# Patient Record
Sex: Female | Born: 1955
Health system: Southern US, Community
[De-identification: ages and names within clinical notes are randomized; demographics above are authoritative.]

## PROBLEM LIST (undated history)

## (undated) DIAGNOSIS — I499 Cardiac arrhythmia, unspecified: Secondary | ICD-10-CM

## (undated) DIAGNOSIS — R51 Headache: Secondary | ICD-10-CM

## (undated) DIAGNOSIS — E079 Disorder of thyroid, unspecified: Secondary | ICD-10-CM

## (undated) DIAGNOSIS — F329 Major depressive disorder, single episode, unspecified: Secondary | ICD-10-CM

## (undated) DIAGNOSIS — J189 Pneumonia, unspecified organism: Secondary | ICD-10-CM

## (undated) DIAGNOSIS — K5792 Diverticulitis of intestine, part unspecified, without perforation or abscess without bleeding: Secondary | ICD-10-CM

## (undated) DIAGNOSIS — K219 Gastro-esophageal reflux disease without esophagitis: Secondary | ICD-10-CM

## (undated) DIAGNOSIS — E039 Hypothyroidism, unspecified: Secondary | ICD-10-CM

## (undated) DIAGNOSIS — G4730 Sleep apnea, unspecified: Secondary | ICD-10-CM

## (undated) DIAGNOSIS — F32A Depression, unspecified: Secondary | ICD-10-CM

## (undated) DIAGNOSIS — M5136 Other intervertebral disc degeneration, lumbar region: Secondary | ICD-10-CM

## (undated) DIAGNOSIS — Q7649 Other congenital malformations of spine, not associated with scoliosis: Secondary | ICD-10-CM

## (undated) DIAGNOSIS — M51369 Other intervertebral disc degeneration, lumbar region without mention of lumbar back pain or lower extremity pain: Secondary | ICD-10-CM

## (undated) DIAGNOSIS — F419 Anxiety disorder, unspecified: Secondary | ICD-10-CM

## (undated) DIAGNOSIS — Z972 Presence of dental prosthetic device (complete) (partial): Secondary | ICD-10-CM

## (undated) DIAGNOSIS — R519 Headache, unspecified: Secondary | ICD-10-CM

## (undated) DIAGNOSIS — R0600 Dyspnea, unspecified: Secondary | ICD-10-CM

## (undated) HISTORY — PX: TONSILLECTOMY: SUR1361

## (undated) HISTORY — PX: SPINAL FUSION: SHX223

## (undated) HISTORY — PX: APPENDECTOMY: SHX54

## (undated) HISTORY — PX: ABDOMINAL HYSTERECTOMY: SHX81

## (undated) HISTORY — PX: JOINT REPLACEMENT: SHX530

---

## 1990-05-12 HISTORY — PX: BACK SURGERY: SHX140

## 1998-08-19 ENCOUNTER — Emergency Department (HOSPITAL_COMMUNITY): Admission: EM | Admit: 1998-08-19 | Discharge: 1998-08-19 | Payer: Self-pay | Admitting: Emergency Medicine

## 1998-08-19 ENCOUNTER — Encounter: Payer: Self-pay | Admitting: *Deleted

## 1998-08-29 ENCOUNTER — Ambulatory Visit (HOSPITAL_COMMUNITY): Admission: RE | Admit: 1998-08-29 | Discharge: 1998-08-29 | Payer: Self-pay | Admitting: Family Medicine

## 1998-08-29 ENCOUNTER — Encounter: Payer: Self-pay | Admitting: Family Medicine

## 1999-05-27 ENCOUNTER — Encounter: Payer: Self-pay | Admitting: Family Medicine

## 1999-05-27 ENCOUNTER — Ambulatory Visit (HOSPITAL_COMMUNITY): Admission: RE | Admit: 1999-05-27 | Discharge: 1999-05-27 | Payer: Self-pay | Admitting: Family Medicine

## 2000-02-22 ENCOUNTER — Emergency Department (HOSPITAL_COMMUNITY): Admission: EM | Admit: 2000-02-22 | Discharge: 2000-02-22 | Payer: Self-pay | Admitting: Emergency Medicine

## 2000-07-07 ENCOUNTER — Other Ambulatory Visit: Admission: RE | Admit: 2000-07-07 | Discharge: 2000-07-07 | Payer: Self-pay | Admitting: Obstetrics and Gynecology

## 2000-07-22 ENCOUNTER — Inpatient Hospital Stay: Admission: EM | Admit: 2000-07-22 | Discharge: 2000-07-23 | Payer: Self-pay | Admitting: Emergency Medicine

## 2000-07-22 ENCOUNTER — Encounter: Payer: Self-pay | Admitting: Emergency Medicine

## 2000-12-14 ENCOUNTER — Encounter: Payer: Self-pay | Admitting: Emergency Medicine

## 2000-12-14 ENCOUNTER — Emergency Department (HOSPITAL_COMMUNITY): Admission: EM | Admit: 2000-12-14 | Discharge: 2000-12-14 | Payer: Self-pay | Admitting: Internal Medicine

## 2000-12-17 ENCOUNTER — Encounter: Payer: Self-pay | Admitting: Emergency Medicine

## 2000-12-17 ENCOUNTER — Ambulatory Visit (HOSPITAL_COMMUNITY): Admission: RE | Admit: 2000-12-17 | Discharge: 2000-12-17 | Payer: Self-pay | Admitting: Emergency Medicine

## 2001-12-24 ENCOUNTER — Emergency Department (HOSPITAL_COMMUNITY): Admission: EM | Admit: 2001-12-24 | Discharge: 2001-12-24 | Payer: Self-pay | Admitting: Emergency Medicine

## 2002-01-28 ENCOUNTER — Encounter: Payer: Self-pay | Admitting: Neurosurgery

## 2002-01-28 ENCOUNTER — Encounter: Admission: RE | Admit: 2002-01-28 | Discharge: 2002-01-28 | Payer: Self-pay | Admitting: Neurosurgery

## 2002-02-11 ENCOUNTER — Encounter: Admission: RE | Admit: 2002-02-11 | Discharge: 2002-02-11 | Payer: Self-pay | Admitting: Neurosurgery

## 2002-02-11 ENCOUNTER — Encounter: Payer: Self-pay | Admitting: Neurosurgery

## 2002-02-24 ENCOUNTER — Encounter: Admission: RE | Admit: 2002-02-24 | Discharge: 2002-02-24 | Payer: Self-pay | Admitting: Neurosurgery

## 2002-02-24 ENCOUNTER — Encounter: Payer: Self-pay | Admitting: Neurosurgery

## 2002-05-30 ENCOUNTER — Encounter: Payer: Self-pay | Admitting: Emergency Medicine

## 2002-05-30 ENCOUNTER — Emergency Department (HOSPITAL_COMMUNITY): Admission: EM | Admit: 2002-05-30 | Discharge: 2002-05-30 | Payer: Self-pay | Admitting: Emergency Medicine

## 2002-06-01 ENCOUNTER — Emergency Department (HOSPITAL_COMMUNITY): Admission: EM | Admit: 2002-06-01 | Discharge: 2002-06-01 | Payer: Self-pay | Admitting: Emergency Medicine

## 2002-08-24 ENCOUNTER — Ambulatory Visit (HOSPITAL_COMMUNITY): Admission: RE | Admit: 2002-08-24 | Discharge: 2002-08-24 | Payer: Self-pay | Admitting: Family Medicine

## 2002-08-24 ENCOUNTER — Encounter: Payer: Self-pay | Admitting: Family Medicine

## 2002-11-08 ENCOUNTER — Emergency Department (HOSPITAL_COMMUNITY): Admission: EM | Admit: 2002-11-08 | Discharge: 2002-11-08 | Payer: Self-pay | Admitting: *Deleted

## 2002-11-08 ENCOUNTER — Encounter: Payer: Self-pay | Admitting: Emergency Medicine

## 2003-04-16 ENCOUNTER — Inpatient Hospital Stay (HOSPITAL_COMMUNITY): Admission: EM | Admit: 2003-04-16 | Discharge: 2003-04-19 | Payer: Self-pay | Admitting: Emergency Medicine

## 2003-04-18 ENCOUNTER — Encounter (INDEPENDENT_AMBULATORY_CARE_PROVIDER_SITE_OTHER): Payer: Self-pay | Admitting: Cardiology

## 2003-08-11 ENCOUNTER — Emergency Department (HOSPITAL_COMMUNITY): Admission: EM | Admit: 2003-08-11 | Discharge: 2003-08-12 | Payer: Self-pay | Admitting: Emergency Medicine

## 2003-08-31 ENCOUNTER — Inpatient Hospital Stay (HOSPITAL_COMMUNITY): Admission: RE | Admit: 2003-08-31 | Discharge: 2003-09-05 | Payer: Self-pay | Admitting: Neurosurgery

## 2004-03-14 ENCOUNTER — Emergency Department (HOSPITAL_COMMUNITY): Admission: EM | Admit: 2004-03-14 | Discharge: 2004-03-14 | Payer: Self-pay | Admitting: Emergency Medicine

## 2004-03-16 ENCOUNTER — Ambulatory Visit (HOSPITAL_COMMUNITY): Admission: RE | Admit: 2004-03-16 | Discharge: 2004-03-16 | Payer: Self-pay | Admitting: General Surgery

## 2004-03-19 ENCOUNTER — Encounter: Admission: RE | Admit: 2004-03-19 | Discharge: 2004-03-19 | Payer: Self-pay | Admitting: General Surgery

## 2004-04-27 ENCOUNTER — Other Ambulatory Visit: Payer: Self-pay

## 2004-04-27 ENCOUNTER — Emergency Department: Payer: Self-pay | Admitting: Emergency Medicine

## 2004-05-08 ENCOUNTER — Ambulatory Visit: Payer: Self-pay | Admitting: Emergency Medicine

## 2004-06-28 ENCOUNTER — Emergency Department (HOSPITAL_COMMUNITY): Admission: EM | Admit: 2004-06-28 | Discharge: 2004-06-28 | Payer: Self-pay | Admitting: *Deleted

## 2004-07-08 ENCOUNTER — Emergency Department (HOSPITAL_COMMUNITY): Admission: EM | Admit: 2004-07-08 | Discharge: 2004-07-08 | Payer: Self-pay | Admitting: Emergency Medicine

## 2004-12-16 ENCOUNTER — Other Ambulatory Visit: Payer: Self-pay

## 2004-12-16 ENCOUNTER — Emergency Department: Payer: Self-pay | Admitting: Emergency Medicine

## 2005-01-03 ENCOUNTER — Observation Stay (HOSPITAL_COMMUNITY): Admission: EM | Admit: 2005-01-03 | Discharge: 2005-01-04 | Payer: Self-pay | Admitting: Emergency Medicine

## 2005-01-30 ENCOUNTER — Ambulatory Visit (HOSPITAL_BASED_OUTPATIENT_CLINIC_OR_DEPARTMENT_OTHER): Admission: RE | Admit: 2005-01-30 | Discharge: 2005-01-30 | Payer: Self-pay | Admitting: Family Medicine

## 2005-02-02 ENCOUNTER — Ambulatory Visit: Payer: Self-pay | Admitting: Internal Medicine

## 2005-02-21 ENCOUNTER — Ambulatory Visit: Payer: Self-pay | Admitting: Internal Medicine

## 2005-04-22 ENCOUNTER — Emergency Department: Payer: Self-pay | Admitting: Emergency Medicine

## 2005-04-23 ENCOUNTER — Other Ambulatory Visit: Payer: Self-pay

## 2005-08-06 ENCOUNTER — Ambulatory Visit (HOSPITAL_COMMUNITY): Admission: RE | Admit: 2005-08-06 | Discharge: 2005-08-06 | Payer: Self-pay | Admitting: Neurosurgery

## 2005-09-04 ENCOUNTER — Inpatient Hospital Stay (HOSPITAL_COMMUNITY): Admission: RE | Admit: 2005-09-04 | Discharge: 2005-09-08 | Payer: Self-pay | Admitting: Neurosurgery

## 2006-05-12 HISTORY — PX: CRANIECTOMY SUBOCCIPITAL W/ CERVICAL LAMINECTOMY / CHIARI: SUR327

## 2006-09-22 ENCOUNTER — Observation Stay (HOSPITAL_COMMUNITY): Admission: EM | Admit: 2006-09-22 | Discharge: 2006-09-24 | Payer: Self-pay | Admitting: *Deleted

## 2007-04-22 ENCOUNTER — Emergency Department (HOSPITAL_COMMUNITY): Admission: EM | Admit: 2007-04-22 | Discharge: 2007-04-22 | Payer: Self-pay | Admitting: Emergency Medicine

## 2007-05-30 ENCOUNTER — Encounter: Admission: RE | Admit: 2007-05-30 | Discharge: 2007-05-30 | Payer: Self-pay | Admitting: Sports Medicine

## 2007-12-18 ENCOUNTER — Emergency Department: Payer: Self-pay | Admitting: Emergency Medicine

## 2007-12-18 ENCOUNTER — Other Ambulatory Visit: Payer: Self-pay

## 2007-12-19 ENCOUNTER — Other Ambulatory Visit: Payer: Self-pay

## 2008-11-29 ENCOUNTER — Emergency Department: Payer: Self-pay | Admitting: Emergency Medicine

## 2008-12-04 ENCOUNTER — Emergency Department (HOSPITAL_COMMUNITY): Admission: EM | Admit: 2008-12-04 | Discharge: 2008-12-04 | Payer: Self-pay | Admitting: Emergency Medicine

## 2009-08-29 ENCOUNTER — Emergency Department (HOSPITAL_COMMUNITY): Admission: EM | Admit: 2009-08-29 | Discharge: 2009-08-29 | Payer: Self-pay | Admitting: Emergency Medicine

## 2009-09-08 ENCOUNTER — Emergency Department: Payer: Self-pay | Admitting: Emergency Medicine

## 2009-09-10 ENCOUNTER — Emergency Department: Payer: Self-pay | Admitting: Internal Medicine

## 2009-11-07 ENCOUNTER — Ambulatory Visit: Payer: Self-pay | Admitting: Family Medicine

## 2009-11-07 DIAGNOSIS — M722 Plantar fascial fibromatosis: Secondary | ICD-10-CM | POA: Insufficient documentation

## 2009-11-07 DIAGNOSIS — E039 Hypothyroidism, unspecified: Secondary | ICD-10-CM | POA: Insufficient documentation

## 2009-11-07 DIAGNOSIS — L84 Corns and callosities: Secondary | ICD-10-CM

## 2009-11-07 HISTORY — DX: Plantar fascial fibromatosis: M72.2

## 2009-11-07 HISTORY — DX: Corns and callosities: L84

## 2009-11-11 ENCOUNTER — Telehealth (INDEPENDENT_AMBULATORY_CARE_PROVIDER_SITE_OTHER): Payer: Self-pay | Admitting: Family Medicine

## 2010-05-12 HISTORY — PX: CARPAL TUNNEL RELEASE: SHX101

## 2010-06-11 NOTE — Assessment & Plan Note (Signed)
Summary: FOOT PAIN/JBB   Vital Signs:  Patient Profile:   55 Years Old Female CC:      Left Foot Pain / rwt Height:     65.5 inches Weight:      223 pounds BMI:     36.68 O2 Sat:      97 % O2 treatment:    Room Air Temp:     97.4 degrees F oral Pulse rate:   98 / minute Pulse rhythm:   regular Resp:     20 per minute BP sitting:   138 / 91  (left arm)  Pt. in pain?   yes    Location:   foot    Intensity:   8    Type:       aching  Vitals Entered By: Levonne Spiller EMT-P (November 07, 2009 12:11 PM)              Is Patient Diabetic? No      Current Allergies: ! * ANTIHISTAMINES ! MORPHINE  History of Present Illness History from: patient Reason for visit: see chief complaint Chief Complaint: Left Foot Pain / rwt History of Present Illness: Patient reports that the left foot pain started about 3 days ago. The pain extends from the ball of her foot back to the heel and arch. Reports that it feels worse with any pressure and better with dorsiflexion. Says it feels like she stepped on a rock with her bare feet at her heel. She has a history of heel spurs in her right foot. Wears orthotics bilaterally, but has never had this pain before.   No history of Gout  REVIEW OF SYSTEMS Constitutional Symptoms      Denies fever, chills, night sweats, weight loss, weight gain, and fatigue.  Eyes       Denies change in vision, eye pain, eye discharge, glasses, contact lenses, and eye surgery. Ear/Nose/Throat/Mouth       Denies hearing loss/aids, change in hearing, ear pain, ear discharge, dizziness, frequent runny nose, frequent nose bleeds, sinus problems, sore throat, hoarseness, and tooth pain or bleeding.  Respiratory       Complains of dry cough.      Denies productive cough, wheezing, shortness of breath, asthma, bronchitis, and emphysema/COPD.  Cardiovascular       Denies murmurs, chest pain, and tires easily with exhertion.    Gastrointestinal       Denies stomach pain,  nausea/vomiting, diarrhea, constipation, blood in bowel movements, and indigestion. Genitourniary       Denies painful urination, kidney stones, and loss of urinary control. Neurological       Complains of numbness and tingling.      Denies paralysis, seizures, and fainting/blackouts. Musculoskeletal       Complains of decreased range of motion, redness, and swelling.      Denies muscle pain, joint pain, joint stiffness, muscle weakness, and gout.  Skin       Denies bruising, unusual mles/lumps or sores, and hair/skin or nail changes.  Psych       Complains of anxiety/stress.      Denies mood changes, temper/anger issues, speech problems, depression, and sleep problems.  Past History:  Past Medical History: Hypothyroidism  Past Surgical History: Back Surgery Neck Surgery  Social History: Occupation: works a second job as a Conservation officer, nature at [employer] Physical Exam General appearance: well developed, well nourished, moderate distress - antalgic gait Heart: 2+ DP and PT pulses on left Extremities: Left foot  plantar surface erythematous and swollen - Hard tender callous at balls of her foot. Very tender taut mid arch and heel. MSE: oriented to time, place, and person Assessment New Problems: HYPOTHYROIDISM (ICD-244.9) CALLUSES, LEFT FOOT (ICD-700) FASCIITIS, PLANTAR (ICD-728.71)   Plan New Medications/Changes: PREDNISONE 20 MG TABS (PREDNISONE) 3 by mouth day x 3 days, then 2 by mouth x 3 days, then 1 by mouth x 3 days.  #18 x 0, 11/07/2009, Tacey Ruiz MD  New Orders: Depo- Medrol 80mg  [J1040] Admin of Therapeutic Inj (IM or  [place]) [16109] New Patient Level III [99203]  The patient and/or caregiver has been counseled thoroughly with regard to medications prescribed including dosage, schedule, interactions, rationale for use, and possible side effects and they verbalize understanding.  Diagnoses and expected course of recovery discussed and will return if not improved as expected or  if the condition worsens. Patient and/or caregiver verbalized understanding.  Prescriptions: PREDNISONE 20 MG TABS (PREDNISONE) 3 by mouth day x 3 days, then 2 by mouth x 3 days, then 1 by mouth x 3 days.  #18 x 0   Entered and Authorized by:   Tacey Ruiz MD   Signed by:   Tacey Ruiz MD on 11/07/2009   Method used:   Electronically to        [facility] [address] (retail)       [address]       [place], [place]  [address]       Ph: [phone]       Fax: [phone]   RxID:   [id number]   Medication Administration  Injection # 1:    Medication: Depo- Medrol 80mg     Diagnosis: FASCIITIS, PLANTAR (ICD-728.71)    Route: IM    Site: LUOQ gluteus    Exp Date: 12/10/2011    Lot #: [id number]    Comments: NDC# 4401-0272-53    Patient tolerated injection without complications    Given by: Levonne Spiller EMT-P (November 07, 2009 1:21 PM)  Orders Added: 1)  Depo- Medrol 80mg  [J1040] 2)  Admin of Therapeutic Inj (IM or [place]) [66440] 3)  New Patient Level III [99203]  The risks, benefits and possible side effects of the treatments and tests were explained clearly to the patient and the patient verbalized understanding.  The patient was informed that there is no on-call provider or services available at this clinic during off-hours (when the clinic is closed).  If the patient developed a problem or concern that required immediate attention, the patient was advised to go the the nearest available urgent care or emergency department for medical care.  The patient verbalized understanding.

## 2010-06-11 NOTE — Progress Notes (Signed)
Summary: phone note  Phone Note Call from Patient Call back at Home Phone [phone]   Caller: Patient Call For: Dr. Severiano Gilbert Reason for Call: Talk to Nurse Action Taken: Phone Call Completed, Provider Notified, Patient advised to go to ER Details of Complaint: Patient called stating that her foot is no better; has 4 more days of prednisone; States still having pain uses [facility] [phone] Summary of Call: I reviewed the patient's encounter documentation and she was diagnosed with a plantar fasciitis and treated with an injection of corticosteroids and sent home on a steroid taper. Apparently the patient is still having significant foot pain and reports that she is taking the corticosteroids as prescribed. I recommend that the patient complete a course of corticosteroids as prescribed and followup with her primary care provider and seek a referral to a podiatrist as soon as possible. If her pain is severe, greater than 4/10,  I recommend she go to the emergency department so that she can be evaluated for this severe foot pain so she can have xrays.  I would've expected that her plantar fasciitis would have improved significantly on the amount of steroids that she was prescribed. The patient can also followup with Dr. Severiano Gilbert later this week.  The patient was informed that there is no on-call provider or services available at this clinic during off-hours (when the clinic is closed).  If the patient developed a problem or concern that required immediate attention, the patient was advised to go the the nearest available urgent care or emergency department for medical care.  The patient verbalized understanding.     It was clearly explained to the patient that this [facility] is not intended to be a primary care clinic.  The patient is always better served by the continuity of care and the provider/patient relationships developed with their dedicated primary care provider.   The patient was told to be sure to follow up as soon as possible with their primary care provider to discuss treatments received and to receive further examination and testing.  The patient verbalized understanding. The will f/u with PCP ASAP.   Rodney Langton, M.D., F.A.A.F.P.  November 11, 2009  Initial call taken by: Providence Crosby LPN,  November 11, 100 12:03 PM  Follow-up for Phone Call        Patient advised to go to ER  Patient advised to follow up with PCP as soon as possible.  Follow-up by: Standley Dakins MD,  November 11, 2009 12:12 PM  Additional Follow-up for Phone Call Additional follow up Details #1::        Patient Notified states her primary care doctor is Dr. Severiano Gilbert // Told to see her primary care for possible referral to podiatrist;;If her pain is no better should go to emergency department for xray to rule out fracture Additional Follow-up by: Providence Crosby LPN,  November 12, 7251 12:21 PM

## 2010-08-18 LAB — POCT CARDIAC MARKERS
CKMB, poc: 1.5 ng/mL (ref 1.0–8.0)
CKMB, poc: 2.3 ng/mL (ref 1.0–8.0)
Myoglobin, poc: 74.8 ng/mL (ref 12–200)
Troponin i, poc: <0.05 ng/mL (ref 0.00–0.09)

## 2010-08-18 LAB — POCT I-STAT, CHEM 8
BUN: 14 mg/dL (ref 6–23)
Chloride: 105 mEq/L (ref 96–112)
Creatinine, Ser: 0.9 mg/dL (ref 0.4–1.2)
HCT: 42 % (ref 36.0–46.0)
Hemoglobin: 14.3 g/dL (ref 12.0–15.0)
Potassium: 3.9 mEq/L (ref 3.5–5.1)
Sodium: 137 mEq/L (ref 135–145)

## 2010-08-18 LAB — CBC
HCT: 41.4 % (ref 36.0–46.0)
Hemoglobin: 14.1 g/dL (ref 12.0–15.0)
MCHC: 34 g/dL (ref 30.0–36.0)
MCV: 89.3 fL (ref 78.0–100.0)
RBC: 4.64 MIL/uL (ref 3.87–5.11)
WBC: 10.1 10*3/uL (ref 4.0–10.5)

## 2010-08-18 LAB — URINE CULTURE: Colony Count: 2000

## 2010-08-18 LAB — URINE MICROSCOPIC-ADD ON

## 2010-08-18 LAB — DIFFERENTIAL
Basophils Absolute: 0 10*3/uL (ref 0.0–0.1)
Eosinophils Relative: 3 % (ref 0–5)
Monocytes Absolute: 0.5 10*3/uL (ref 0.1–1.0)
Neutrophils Relative %: 59 % (ref 43–77)

## 2010-08-18 LAB — URINALYSIS, ROUTINE W REFLEX MICROSCOPIC
Bilirubin Urine: NEGATIVE
Protein, ur: NEGATIVE mg/dL
Specific Gravity, Urine: 1.016 (ref 1.005–1.030)
Urobilinogen, UA: 0.2 mg/dL (ref 0.0–1.0)

## 2010-09-24 NOTE — H&P (Signed)
NAME:  HITTKashish, Yglesias NO.:  [id number]   MEDICAL RECORD NO.:  [id number]          PATIENT TYPE:  EMS   LOCATION:  MAJO                         FACILITY:  [facility]   PHYSICIAN:  Ellie Lunch, M.D.      DATE OF BIRTH:  07/24/1955   DATE OF ADMISSION:  09/22/2006  DATE OF DISCHARGE:                              HISTORY & PHYSICAL   REASON FOR ADMISSION:  Chest pain, rule out myocardial infarction.   HISTORY OF PRESENT ILLNESS:  Ms. [name] [name] is a pleasant 55 year old obese  white female with a past medical history significant for hypothyroidism  that came to the emergency room because of several episodes of  substernal 10/10 chest pain that usually start when patient starts  walking and only relieved partially when patient stops and sits down.  These have started happening since 2 days prior to admission and are  consistently getting worse, and thus patient was worried and came to the  emergency room.  They are not associated with nausea, vomiting,  diaphoresis.  The pain extends from the substernal to the middle of the  shoulder blades but does not go to either shoulders.  There is no  associated palpitations, syncope.  The patient does give history of dry  cough for the past 4 days but no sputum production or wheezing, but  there is associated shortness of breath with the chest pain every time  she has those episodes, which is likely pleuritic in nature.   PAST MEDICAL HISTORY:  1. Hypothyroidism.  2. Obesity.  3. A history of negative Cardiolite in 2006.   ALLERGIES:  ANTIHISTAMINES make her have tachycardia, and MORPHINE makes  her have bradycardia.   CURRENT MEDICATIONS:  1. Synthroid 112 mcg p.o. daily.  2. Premarin 0.625 mg daily.  3. Aspirin 81 mg daily.  4. Tylenol p.r.n.  5. Multivitamin p.o. daily.   SOCIAL HISTORY:  She currently has 2 jobs.  She is quite stressed out at  1 of her jobs since it is going to go out of business in the next 4  weeks.  She  does not give any history of smoking, alcohol, or drug  abuse.  She does have history of second-hand smoke from her mother.   FAMILY HISTORY:  Noncontributory for coronary artery disease in the  family, but there is a history of strokes in both sides of the family.   REVIEW OF SYSTEMS:  As per HPI.   PHYSICAL EXAMINATION:  VITAL SIGNS:  Temp 97.8, blood pressure 106/65,  pulse 82, respiration 20, O2 sat is 99% on 2 L.  GENERAL EXAM:  No acute distress.  HEENT EXAM:  Atraumatic, normocephalic.  Pupils equal, round, reactive  to light and accommodation.  Oropharynx is clear.  NECK:  Supple.  No JVD, no bruit.  CARDIOVASCULAR:  Regular rate and rhythm.  No murmurs.  CHEST:  Clear to auscultation bilaterally.  No rales, rhonchi, wheezing.  ABDOMEN:  Soft, nontender, nondistended, positive bowel sounds.  EXTREMITIES:  No clubbing, cyanosis, or edema.  There is no bilateral  calf tenderness.  ADMISSION LABS:  Sodium 136, potassium 3.8, chloride 111, BUN 11,  glucose 94, bicarb is 19.5, creatinine is 1.  Point-of-care markers is  negative.   ADMISSION EKG:  Shows normal sinus rhythm, non-specific T-wave  abnormalities, all intervals are in the normal range.   PLAN:  1. Chest pain, rule out myocardial infarction.  We will go ahead and      admit the patient overnight, cycle her enzymes, admit her on tele      bed, place her on aspirin.  I do not think she needs to be IV      heparin at this point.  Also, given borderline blood pressure, I do      not want to put her on metoprolol.  We will consider getting a      cardiac evaluation in the morning.  We will also go ahead and check      a fasting lipid panel and risk stratify her as well.  2. Dry cough.  We will go ahead and first get a chest x-ray to      evaluate.  We will also place on Protonix as GERD can very often      cause dry cough.  3. Hypothyroidism.  We will check a TSH and continue her home dose of      Synthroid and  titrate accordingly based on TSH levels.      Ellie Lunch, M.D.  Electronically Signed     BP/MEDQ  D:  09/22/2006  T:  09/22/2006  Job:  [id number]   cc:   Dario Guardian, M.D.

## 2010-09-27 NOTE — H&P (Signed)
NAME:  [name] [name], [name] [name]                           ACCOUNT NO.:  [id number]   MEDICAL RECORD NO.:  [id number]                   PATIENT TYPE:  INP   LOCATION:  3105                                 FACILITY:  [facility]   PHYSICIAN:  Hewitt Shorts, M.D.            DATE OF BIRTH:  1956/03/07   DATE OF ADMISSION:  08/31/2003  DATE OF DISCHARGE:                                HISTORY & PHYSICAL   HISTORY OF PRESENT ILLNESS:  The patient is a 55 year old right-handed white  female who has been a patient of mine for 7-1/2 years.  She is status post a  right L5-S1 lumbar laminotomy and diskectomy in January 1998.  She has had  evaluation previously of degenerative changes of the cervical and lumbar  spine, and was diagnosed with a Chiari malformation which was last evaluated  about 15 months ago.   The patient had been doing well for over a year, but about 3-1/2 weeks ago  developed disabling pain in the back of her neck associated with headaches,  blurred vision, repeated episodes of transient loss of vision.  She explains  the pain will occasionally shoot from the craniocervical junction up through  the occiput over the vertex of her head.  MRI was obtained two weeks ago and  showed Chiari malformation without evidence of hydrocephalus.  There was  also degenerative changes at C5-6 and C6-7 noted.  The patient is admitted  now for decompression of her Chiari malformation.   PAST MEDICAL HISTORY:  Hypothyroidism.  She does not have any history of  hypertension, myocardial infarction, cancer, stroke, diabetes, peptic ulcer  disease, or lung disease.   PAST SURGICAL HISTORY:  Right L5-S1 lumbar laminotomy and microdiskectomy in  January 1998.   ALLERGIES:  No known drug allergies, but some medications do cause  palpitations.   CURRENT MEDICATIONS:  1. Premarin 0.625 mg daily.  2. Synthroid 0.112 mg daily.   FAMILY HISTORY:  Her mother is age 27, with emphysema and osteoporosis.  Her  father is in good health at age 64.   SOCIAL HISTORY:  The patient is married.   REVIEW OF SYSTEMS:  Notable for those symptoms described in the history of  present illness and past medical history, but is otherwise unremarkable.   PHYSICAL EXAMINATION:  GENERAL:  The patient is an obese white female in no  acute distress.  VITAL SIGNS:  Her temperature is 97.1, pulse 78, blood pressure 116/89,  respiratory rate 18, height 5 feet 5 inches, weight 212 pounds.  LUNGS:  Clear to auscultation.  She has symmetrical respiratory excursion.  HEART:  Regular rate and rhythm.  S1 and S2.  There is no murmur.  ABDOMEN:  Soft, nondistended, bowel sounds are present.  EXTREMITIES:  No cyanosis, clubbing, or edema.  MUSCULOSKELETAL:  Tenderness to palpation over the craniocervical junction,  none over the cervical spinous process.  Range of motion of  the neck causes  some discomfort.  NEUROLOGIC:  Mental status:  The patient is awake, alert, fully oriented.  Her speech is fluent.  She has good comprehension.  Cranial nerves show  pupils to be equal, round, and reactive to light.  Extraocular movements  were intact.  Facial movement is symmetrical.  Hearing is present  bilaterally.  Palate movement is symmetric.  Shoulder shrug is symmetrical.  Tongue is midline.  Motor examination shows 5/5 strength of the upper and  lower extremities.  She has no drift of the upper extremities.  Strength is  5/5 in the deltoids, biceps, triceps, intrinsic grip.  She has normal gait.  Sensation is intact to pinprick through the upper and lower extremities.  Reflexes are 1 to 2 in the biceps, brachialis, and triceps, 1 to 2 in the  quadriceps, and left gastrocnemius.  The right gastrocnemius is absent.  Toes are downgoing bilaterally.   IMPRESSION:  The patient with previously diagnosed Chiari malformation that  is becoming increasing symptomatic with disabling discomfort and headache  pain, as well as visual  alteration.  She also has significant degenerative  disk disease, spondylosis, and _______________at C5-6 and C6-7;  however, it  is felt that the great majority of her symptoms are arising from the Chiari  malformation.   PLAN:  The patient will be admitted for decompression of the Chiari  malformation via suboccipital craniectomy, upper cervical laminectomy, and  duraplasty.  We discussed the nature of the surgerical procedure,  ____________, limitations postoperatively, the postoperative care in the  intensive care unit, and risks of surgery including risk of bleeding,  infection, possible need for transfusion, the risk of stroke and paralysis,  the risk of cerebellar and/or spinal cord dysfunction, the risk of  cerebrospinal fluid leak, and anesthetic risk of myocardial infarction,  stroke, pneumonia, and death.  After discussing all of this, she wishes to  proceed with surgery and is admitted for such.                                                Hewitt Shorts, M.D.    RWN/MEDQ  D:  08/31/2003  T:  09/02/2003  Job:  [id number]

## 2010-09-27 NOTE — Consult Note (Signed)
NAME:  [name] [name], [name] [name]                           ACCOUNT NO.:  [id number]   MEDICAL RECORD NO.:  [id number]                   PATIENT TYPE:  OBV   LOCATION:  3736                                 FACILITY:  [facility]   PHYSICIAN:  Armanda Magic, M.D.                  DATE OF BIRTH:  Oct 05, 1955   DATE OF CONSULTATION:  04/17/2003  DATE OF DISCHARGE:                                   CONSULTATION   REFERRING PHYSICIAN:  Dr. Hollice Espy.   PRIMARY PHYSICIAN:  Dr. Dario Guardian.   REASON FOR CONSULT:  Chest pain, palpitations and dizziness.   HISTORY OF PRESENT ILLNESS:  This is a 55 year old obese white female who  has just recovered from acute bronchitis last week.  Apparently, on  Saturday, she began having palpitations in the evening.  She also was having  problems with shortness of breath.  She did have a pretty significant cough  and then started having chest pain with tenderness, clearly reproducible on  palpitation over the right side of the chest.  By Sunday morning, she was  awakened with right-sided chest tightness, no classic substernal chest pain,  no radiation.  She does also have a complaint of dizziness with standing  upright when questioning her more.  The dizziness is described as the room  spinning around, consistent with vertigo.  Prior to the episode of  bronchitis, she denied any chest pain, shortness of breath, PND, orthopnea,  dyspnea on exertion or exertional chest pain or dizziness.  Several years  ago, she did have one episode of chest pain which was evaluated at [facility] and she was told she had an irregular heart beat but was not placed  on any medications.   PAST MEDICAL HISTORY:  1. Back surgery.  2. Recent bronchitis.   FAMILY HISTORY:  Her mother has COPD.  Her father is healthy.  She has an  uncle with peripheral vascular disease and coronary disease.   SOCIAL HISTORY:  She denies tobacco use.  She socially drinks alcohol.  She  is  divorced, no children.  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  She did grow up in a home with smokers.   MEDICATIONS ON ADMISSION:  Aspirin and vitamins.   ALLERGIES:  ACETAMINOPHEN causes an increased heart rate.   PHYSICAL EXAMINATION:  VITAL SIGNS:  On physical exam, blood pressure is  119/76, heart rate 57, respirations 20.  GENERAL:  She is a well-developed, well-nourished female in no acute  distress.  HEENT:  Benign.  NECK:  Neck is supple without lymphadenopathy.  Carotid upstrokes are +2  bilaterally.  No bruits.  LUNGS:  Lungs are clear to auscultation throughout.  HEART:  Heart is regular rate and rhythm; no murmurs, rubs, or gallops;  normal S1 and S2.  CHEST:  Chest wall is clearly painful with palpation, reproducing  her  symptoms.  ABDOMEN:  Benign.  EXTREMITIES:  No edema.   LABORATORIES:  Sodium 139, potassium 3.5, chloride 109, bicarb not recorded,  glucose 125, creatinine 1.1, BUN 12, INR 0.9.  Hemoglobin 13, hematocrit 39.  Troponins are less than 0.01 x2, CK 98 and 83, MB 1.3 and 1.1.   EKG demonstrates normal sinus rhythm with incomplete right bundle branch  block and nonspecific T wave abnormality in the precordial leads as well as  in the inferior leads.   ASSESSMENT:  1. Atypical chest pain more consistent with musculoskeletal with     costochondritis secondary to cough with recent upper respiratory     infection.  Cardiac enzymes are negative x3.  Electrocardiogram does show     an incomplete right bundle branch block with T wave abnormality in the     anterior precordial leads, most likely repolarization change from the     incomplete right bundle branch block.  Adenosine/Cardiolite study showed     no inducible ischemia, normal left ventricular systolic function.  2. Dizziness consistent with description of vertigo most likely secondary to     inner ear problems from her recent upper respiratory infection.  3. Palpitations secondary  to upper respiratory infection; those have     resolved.   PLAN:  1. Two-dimensional echocardiogram is pending.  Okay to discharge to home if     2-D echocardiogram shows normal-structural heart.  2. Would consider getting outpatient event monitor if palpitations recur.  3. Okay to discontinue Lovenox.  4. Consider ENT evaluation for vertigo if symptoms persist.  5. Recommend a trial of NSAIDs for costochondritis.                                               Armanda Magic, M.D.    TT/MEDQ  D:  04/17/2003  T:  04/18/2003  Job:  [id number]   cc:   Hollice Espy, M.D.   Dario Guardian, M.D.  [address]  [place]  [place] [address]  Fax: [phone]

## 2010-09-27 NOTE — Discharge Summary (Signed)
NAME:  Spaeth, TEAGHAN MELROSE                           ACCOUNT NO.:  [id number]   MEDICAL RECORD NO.:  [id number]                   PATIENT TYPE:  INP   LOCATION:  6707                                 FACILITY:  [facility]   PHYSICIAN:  Deirdre Peer. Polite, M.D.              DATE OF BIRTH:  06-Mar-1956   DATE OF ADMISSION:  04/16/2003  DATE OF DISCHARGE:  04/19/2003                                 DISCHARGE SUMMARY   DISCHARGE DIAGNOSES:  1. Chest pain, resolved, non-cardiac etiology, negative stress test.  2. Hypothyroid, TSH of 22, this is newly diagnosed.  New medication     Synthroid 100 mcg one p.o. q. day.  3. History of bronchitis, resolved.  4. History of dizziness, resolved, probable labyrinthitis related to #3.   DISCHARGE MEDICATIONS:  1. Synthroid 100 mcg one p.o. q. day.  2. Resume p.r.n. aspirin and multivitamins.   CONSULTATIONS:  [facility], Dr. Fraser Din.   STUDIES:  The patient had a Cardiolite stress test with an EF of 72%, no  wall motion abnormality, no evidence of ischemia or infarct.  CBC within  normal limits.  BMET within normal limits.  CK negative.  Troponin-I 0.01  x3.  Total cholesterol 217, triglycerides 333, LDL 118, HDL 32.  Chest x-  ray:  Heart is mildly enlarged, there is mild atelectasis in the lung bases.   HISTORY OF PRESENT ILLNESS:  A 55 year old female presented to the ED with  complaints of right-sided chest pain, subjective palpitations, and dizziness  on an outpatient basis.  Of note, the patient has been recently diagnosed  with a bronchitis which has partially resolved.  In the ED, the patient was  seen and evaluated and found to have an EKG with inverted T's, V1 through  V4.  Cardiac enzymes within normal limits.  Admission was deemed necessary  for further evaluation and treatment.   PAST MEDICAL HISTORY:  1. Prior back surgery.  2. Recent bronchitis.   FAMILY HISTORY:  Mother has COPD.  Father is fairly healthy.   SOCIAL HISTORY:  She  denies tobacco use.  Social alcohol.  No drugs.   MEDICATIONS ON ADMISSION:  1. Aspirin.  2. Vitamins.   HOSPITAL COURSE:  #1 -  As stated, Ms. Devall presented to the ED with the  complaint of chest pain.  It was felt prudent to admit to rule out cardiac  ischemia.  The patient was seen in consultation by cardiology.  Serial  cardiac enzymes were ordered which were negative.  The patient underwent a  Cardiolite stress test which showed no signs of ischemia.  Because the  patient's chest x-ray suggested mild cardiac enlargement, a 2-D  echocardiogram was obtained which revealed an EF of 55 to 65% with no  regional wall motion abnormalities.  From a cardiac standpoint, the patient  was deemed stable for discharge, and no cardiac medications indicated.  #2 -  NEWLY DIAGNOSED HYPOTHYROID:  During this hospitalization, the patient  had screening labs and found to have a TSH of 22.  A repeat test was ordered  to rule out error with a TSH of 32.  The patient will be started on  Synthroid at 100 mcg.  This will need to be titrated on an outpatient basis  by the patient's primary M.D.  #3 -  ABNORMAL LIPIDS WITH A TOTAL CHOLESTEROL OF 217 AND TRIGLYCERIDES OF  333:  The patient's lipid abnormality may be secondary to her undiagnosed  hypothyroid.  It is recommended treat hypothyroidism and follow up lipids,  and consider treatment on an outpatient basis if indicated.  #4 -  QUESTIONABLE DIZZINESS:  This was not a problem during this  hospitalization.  It may have been a secondary problem, i.e., labyrinthitis  from the patient's recent bronchitis, possible viral illness.  At this time,  no further treatment is recommended as it is not a problem at this time.                                                Deirdre Peer. Polite, M.D.    RDP/MEDQ  D:  04/19/2003  T:  04/20/2003  Job:  [id number]   cc:   Dario Guardian, M.D.  [address]  [place]  [place] [address]  Fax: [phone]

## 2010-09-27 NOTE — Discharge Summary (Signed)
NAME:  [name] [name], [name] [name]                           ACCOUNT NO.:  [id number]   MEDICAL RECORD NO.:  [id number]                   PATIENT TYPE:  INP   LOCATION:  3034                                 FACILITY:  [facility]   PHYSICIAN:  Hewitt Shorts, M.D.            DATE OF BIRTH:  [birth year]   DATE OF ADMISSION:  08/31/2003  DATE OF DISCHARGE:  09/05/2003                                 DISCHARGE SUMMARY   ADMISSION HISTORY AND PHYSICAL EXAMINATION:  The patient is a 55 year old  woman who has had a previously diagnosed Chiari malformation.  She had  presented with increasing headache, blurred vision, repeated episodes of  transient loss of vision and it was felt that the Chiari malformation was  becoming more symptomatic and the patient was admitted for decompression of  the Chiari malformation.  Details of her examination are included in her  admission note.   HOSPITAL COURSE:  The patient was admitted, underwent decompression of the  Chiari malformation via suboccipital craniectomy, C1 and C2 cervical  laminectomies and duraplasty with Duraguard.  Patient tolerated the  procedure well.  Postoperatively, she had a moderate amount of nausea which  gradually improved.  She also had a moderate amount of discomfort as well as  relative limited mobility of the neck.  She was treated with analgesics as  well as Flexeril as a muscle relaxant.  She had some difficulties with  hypoxia which responded well to flutter valve as well as incentive  spirometry.  Her p.o. intake has steadily improved as her nausea has  diminished, she is afebrile, her wound is healing well and she is being  discharged to home with instructions regarding further wound care and  activities.   FOLLOWUP:  She is to return to my office for staple and suture removal next  week.   MEDICATIONS:  A discharge prescription was given for:  1. Percocet 1 or 2 tablets p.o. q.4-6 h. p.r.n. for pain, 50 tablets, no     refills.  2.  Flexeril 10 mg p.o. t.i.d. p.r.n. muscle spasms, 100 tablets and no     refills.   DISCHARGE DIAGNOSIS:  Chiari malformation.                                                Hewitt Shorts, M.D.    RWN/MEDQ  D:  09/05/2003  T:  09/05/2003  Job:  [id number]

## 2010-09-27 NOTE — H&P (Signed)
NAME:  [name] [name], [name] [name] NO.:  [id number]   MEDICAL RECORD NO.:  [id number]          PATIENT TYPE:  INP   LOCATION:  3172                         FACILITY:  [facility]   PHYSICIAN:  Hewitt Shorts, M.D.DATE OF BIRTH:  Sep 11, 1955   DATE OF ADMISSION:  09/04/2005  DATE OF DISCHARGE:                                HISTORY & PHYSICAL   HISTORY OF PRESENT ILLNESS:  The patient is a 55 year old right handed white  female who has been a patient of mine since December of 1997 who underwent a  right L5-S1 lumbar laminotomy and microdiskectomy in January of 1998 and  recovered well from that. She subsequently presented with a Chiari  malformation and underwent Chiari decompression with suboccipital  craniectomy and a C1 and C2 and duraplasty in April 2005 and again recovered  well from that surgery.   Recurrent difficulties began about a month and a half ago. She works two  jobs. After her second job on March 2007, she felt that she could not feel  her lower extremities. The next day, she had pain from her low back  radiating down through both lower extremities with a sense of weakness in  the lower extremities. She denied any numbness or paresthesias. She was seen  in the walk-in clinic and given hydrocodone, Flexeril and a five-day course  of prednisone without relief, and the patient was reevaluated in our office.   X-rays of the lumbar spine were obtained. The previous right L5-S1 lumbar  laminectomy was noted. The most significant finding was advanced  degenerative changes at the L4-5 level with marked disk space narrowing and  rotational and translational scoliosis of L4 on 5 with right to left  translation of L4 level to L5. There is disk space narrowing as well seen at  the L5-S1 level. MRI was obtained and showed advanced degenerative disk  disease and spondylosis at the L4-5 level as well as disk space narrowing at  the L5-S1. There is significant spondylitic disk  herniation with lateral  recess and foraminal stenosis. The decision was made to admit the patient  for decompression and stabilization.   PAST MEDICAL HISTORY:  She denies any history of hypertension, myocardial  infarction, cancer, stroke, diabetes, peptic ulcer disease or lung disease.   PAST SURGICAL HISTORY:  Previous surgeries include her Chiari decompression  in April of 2005 and her right L5-S1 lumbar diskectomy in January of 1998.  She had an appendectomy 32 years ago.   ALLERGIES:  She has no actual allergies to medications but said that  MORPHINE had caused bradycardia in her on one circumstance.   CURRENT MEDICATIONS:  Synthroid, Premarin and pain medication.   FAMILY HISTORY:  Mother is in fair health at age 87 with emphysema. Father  is in good health at age 9. Her mother also suffered a stroke.   SOCIAL HISTORY:  The patient is divorced. She does not smoke, drink alcohol  beverages or have a history of substance abuse.   REVIEW OF SYSTEMS:  Notable for as described in the history of present  illness and  past medical history but is otherwise unremarkable.   PHYSICAL EXAMINATION:  GENERAL:  The patient is a heavy set white female in  no distress.  VITAL SIGNS:  Her temperature is 97, pulse 89, blood pressure 147/74,  respiratory rate 16, height 5 foot 6, weight 213 pounds.  LUNGS:  Clear to auscultation. She has symmetrical respiratory excursion.  HEART:  Regular rate and rhythm without S1 and S2. There is no murmur.  ABDOMEN:  Soft and nondistended. Bowel sounds are present.  EXTREMITIES:  Shows no clubbing, cyanosis, or edema.  MUSCULOSKELETAL:  Shows mild diffuse tenderness in the lumbar region. She  has flexion to 80 degrees but has discomfort with extension and essentially  unable to extend her low back.  NEUROLOGICAL:  Shows 5/5 strength throughout the upper and lower extremities  including deltoid, biceps, triceps, intrinsics, grips, iliopsoas,  quadriceps,  dorsi flexors, extensor hallucis longus, plantar flexors  bilaterally. Sensation is intact to pinprick throughout the upper and lower  extremities. Reflexes are 2 at the quadriceps, absent at gastrocnemius and  symmetrical bilaterally.  Toes are downgoing bilaterally. Her gait and  stance are both hesitant, and she exhibits facial grimacing when trying to  stand upright.   IMPRESSION:  Patient with low back and bilateral lower extremity pain  secondary to advanced degenerative changes at the L4-5 level with  spondylosis, degenerative disk disease, translational and rotation  scoliosis, canal and neural foraminal and lateral recess stenosis,  spondylitic disk herniation.   PLAN:  The patient is admitted for decompression and stabilization at the L4-  5 level with interbody fusion and posterolateral arthrodesis. I have  discussed the nature of her condition, the nature of the surgical procedure,  alternatives to surgery and risks of surgery including risk of infection,  bleeding, possible need for transfusion, the risk of nerve dysfunction,  pain, weakness, numbness, paresthesias, the risk of dural tear, CSF leak and  possible need for further surgery, the risk of failure of the arthrodesis  and anesthetic risks from myocardial infarction, stroke, pneumonia and  death.   Understanding all of this, she would like to go ahead with surgery and is  admitted for such.      Hewitt Shorts, M.D.  Electronically Signed     RWN/MEDQ  D:  09/04/2005  T:  09/04/2005  Job:  [id number]

## 2010-09-27 NOTE — Procedures (Signed)
NAME:  [name] [name], [name] [name]                 ACCOUNT NO.:  [id number]   MEDICAL RECORD NO.:  [id number]          PATIENT TYPE:  OUT   LOCATION:  SLEEP CENTER                 FACILITY:  [facility]   PHYSICIAN:  Clinton D. Maple Hudson, M.D. DATE OF BIRTH:  07/03/1955   DATE OF STUDY:  01/30/2005                              NOCTURNAL POLYSOMNOGRAM   REFERRING PHYSICIAN:  Dr. Merri Brunette.   DATE OF STUDY:  January 30, 2005.   INDICATION FOR STUDY:  Hypersomnia with sleep apnea. Epworth sleepiness  score 9/24, BMI 49. Weight 308 pounds.   SLEEP ARCHITECTURE:  Total sleep time 340 minutes with sleep efficiency 76%.  Stage I was 9%, stage II 52%, stages III and IV 22%, REM 17% of total sleep  time. Sleep latency 21 minutes, REM latency 89 minutes, awake after sleep  onset 86 minutes, arousal index 15. No bedtime medication taken. She had  frequent awakenings through the night which seem to correspond at least  partly to apnea events.   RESPIRATORY DATA:  Split study protocol. Apnea/hypopnea index (AHI, RDI) 24  obstructive events per hour indicating moderate obstructive sleep  apnea/hypopnea syndrome. There were 35 obstructive apneas and 16 hypopneas  before CPAP. Most events and most sleep were while supine. REM AHI 26. CPAP  was titrated to 15 CWP, AHI 10.1 per hour before the technician ran out of  time. A small ResMed full-face Ultra Mirage mask was used with heated  humidifier.   OXYGEN DATA:  Mild to moderate snoring with oxygen desaturation to a nadir  of 74%. Mean oxygen saturation with CPAP control was 94-96% on room air.   CARDIAC DATA:  Normal sinus rhythm.   MOVEMENT/PARASOMNIA:  Occasional leg jerk, insignificant.   IMPRESSION/RECOMMENDATIONS:  1.  Moderate obstructive sleep apnea/hypopnea syndrome, apnea/hypopnea index      24.2 per hour with mild to moderate snoring and oxygen desaturation to      74%. Events were not positional.  2.  CPAP was titrated to 15 centimeter of water  pressure with incomplete      control in the residual apnea/hypopnea index of 10.1 per hour. Suggest      initial home trial at 16 centimeter of water pressure. A small ResMed      full-face Ultra Mirage mask was used for this study with heated      humidifier.  3.  She had difficulty maintaining sleep and may benefit from use of a      sleeping pill while she adjusts to CPAP at home.      Clinton D. Maple Hudson, M.D.  Diplomate, Biomedical engineer of Sleep Medicine  Electronically Signed     CDY/MEDQ  D:  02/02/2005 14:57:36  T:  02/03/2005 00:33:28  Job:  [id number]

## 2010-09-27 NOTE — Discharge Summary (Signed)
NAME:  [name] [name], [name] [name] NO.:  [id number]   MEDICAL RECORD NO.:  [id number]          PATIENT TYPE:  INP   LOCATION:  3006                         FACILITY:  [facility]   PHYSICIAN:  Hewitt Shorts, M.D.DATE OF BIRTH:  [birth year]   DATE OF ADMISSION:  09/04/2005  DATE OF DISCHARGE:  09/08/2005                                 DISCHARGE SUMMARY   ADMISSION HISTORY AND PHYSICAL EXAMINATION:  This is a 55 year old woman who  developed low back pain radiating down to both lower extremities. She was  found to have advanced degenerative changes at the L4-5 level with  rotational and translational  scoliosis of L4 and L5 with right to left  translation of L4 relative to L5.  There was disk space narrowing with  spondylosis degenerative disk disease and a significant spondylotic disk  herniation with lateral recess of the foraminal stenosis.  The decision was  made to admit the patient for decompression and arthrodesis.  Further  details of her history included in her admission note.   General examination was unremarkable.  Neurologic examination showed 5/5  strength and good sensation.   HOSPITAL COURSE:  The patient was admitted and underwent L4-5 transverse  posterior lumbar interbody fusion and L4-5 posterolateral arthrodesis.  She  has done well following surgery.  She had moderate difficulty with nausea  and vomiting and eventually that has subsided.  She was changed from  Percocet to Vicodin.  She was treated with Vistaril, Zofran and Compazine.  Her wound has healed well. She is up and ambulating in the halls.  She is  wearing a lumbar corset when she is up and about.  She is using a rolling  walker with five-inch wheels when walking.  Discharge prescription given for  Norco 5/325 one or two q.4-6h. p.r.n. pain,  50 tablets, no refills.  She is  also advised to use Advil as needed and she is to return in three weeks for  followup of AP and lateral lumbar spine or  sooner if she  has any increased  difficulties.   DISCHARGE DIAGNOSES:  1.  Lumbar degenerative disk disease.  2.  Spondylosis.  3.  Scoliosis.  4.  Stenosis.  5.  Spondylytic disk herniation.  6.  Postoperative nausea and vomiting which has resolved.      Hewitt Shorts, M.D.  Electronically Signed     RWN/MEDQ  D:  09/08/2005  T:  09/08/2005  Job:  [id number]

## 2010-09-27 NOTE — Op Note (Signed)
NAME:  [name] [name], [name]                           ACCOUNT NO.:  [id number]   MEDICAL RECORD NO.:  [id number]                   PATIENT TYPE:  INP   LOCATION:  3105                                 FACILITY:  [facility]   PHYSICIAN:  Hewitt Shorts, M.D.            DATE OF BIRTH:  1955/06/24   DATE OF PROCEDURE:  08/31/2003  DATE OF DISCHARGE:                                 OPERATIVE REPORT   PREOPERATIVE DIAGNOSIS:  Chiari malformation.   POSTOPERATIVE DIAGNOSIS:  Chiari malformation.   PROCEDURE:  Suboccipital craniectomy, C1 and C2 laminectomy, and duraplasty  with microdissection.   SURGEON:  Hewitt Shorts, M.D.   ASSISTANT:  Clydene Fake, M.D.   ANESTHESIA:  General endotracheal.   INDICATIONS:  The patient is a 55 year old woman who was diagnosed over 15  months ago with Chiari malformation, but it has become increasingly  symptomatic and therefore is brought to surgery for decompression of the  Chiari malformation.   PROCEDURE:  Patient brought to the operating room and placed under general  endotracheal anesthesia.  The patient was placed in the three-pin Mayfield  head holder and was then turned to a prone position.  The occipital scalp  was shaved and then the occipital scalp and neck were prepped with Betadine  soap and solution and draped in a sterile fashion.  The midline was  infiltrated with local anesthetic with epinephrine and then a midline  incision was made, carried down through the subcutaneous tissue with bipolar  cautery and electrocautery used to maintain hemostasis.  The dissection was  carried down to the cervical fascia, which was divided bilaterally and the  scalp and paracervical tissue was reflected to either side.  We were able to  expose the suboccipital region and the ring of C1 and the spinous process  and lamina of C2.  The decompression was initiated with the [facility] drill,  proceeding with suboccipital craniectomy and a C1 and C2  laminectomy.  This  was done as well with Kerrison punches.  The bone was thinned down and then  using a 3 mm Kerrison punch with a thin foot plate, the remaining portion of  the lamina was removed.  The dura was thickened, particularly at the foramen  magnum.  This was carefully dissected.  Epidural venous bleeding was  controlled with Gelfoam soaked in thrombin as well as bipolar  electrocautery.  We then opened the dura in a Y-shaped fashion in the  midline over the upper cervical spinal cord and in the suboccipital region  with limbs extending both to the left and right superolaterally.  Edges of  the dura were coagulated as necessary to maintain hemostasis, and we also  used Hemoclips to establish hemostasis with venous bleeding from the leaves  of the dura being controlled.  We then selected a 6 x 8 mm graft of  DuraGuard.  It was washed in saline  and then cut to the appropriate size and  shape and then sutured in place with interrupted and running 6-0 Prolene  sutures.  Good closure was achieved and then Gelfoam soaked in thrombin was  laid over the dural repair and the paraspinal muscles were approximated with  interrupted, undyed 0 Vicryl sutures, the deep fascia closed with  interrupted, undyed 0 Vicryl sutures, the subcutaneous tissue and  subcuticular layer were closed with interrupted, inverted 2-0 undyed Vicryl  sutures, and the skin was reapproximated with interrupted 3-0 nylon placed  in a horizontal mattress fashion and surgical staples.  The wound was  dressed with Adaptic and sterile gauze.  The patient tolerated the procedure  well.  The estimated blood loss was 300 mL.  Sponge and needle count were  correct.  Following surgery the patient was turned back to a supine position, the  three-pin Mayfield head holder was removed, and then the patient was to be  reversed from the anesthetic, extubated, and transferred to the recovery  room for further care.                                                Hewitt Shorts, M.D.    RWN/MEDQ  D:  08/31/2003  T:  09/02/2003  Job:  [id number]

## 2010-09-27 NOTE — Consult Note (Signed)
NAME:  [name] [name], [name] [name]                 ACCOUNT NO.:  [id number]   MEDICAL RECORD NO.:  [id number]          PATIENT TYPE:  EMS   LOCATION:  MAJO                         FACILITY:  [facility]   PHYSICIAN:  Corinna L. Lendell Caprice, MDDATE OF BIRTH:  1955-05-20   DATE OF CONSULTATION:  07/08/2004  DATE OF DISCHARGE:  07/08/2004                                   CONSULTATION   CONSULTING PHYSICIAN:  Dr. Mariel Aloe   REASON FOR CONSULTATION:  Chest pain.   HISTORY OF PRESENT ILLNESS:  [name] [name] is a 55 year old white female who has  been to the emergency room twice in the past week with substernal chest  pain.  It is worsened by lying flat.  It hurts when she presses on her  chest.  She has had no cough, no shortness of breath.  She was seen in the  emergency room by Dr. Margarito Liner on the 17th and was to follow up with her  primary care physician to schedule an outpatient stress test.  She went for  follow-up with Dr. Sloan Leiter and was noticed to have flipped T-waves in the  precordial leads and was therefore sent to the emergency room.  She has no  cardiac risk factors.  She has never had a stress test or previous coronary  artery disease.  The pain comes and goes and it is unrelated to any  particular activities.  She has not taken any medicines for it.  It is not  related to eating.   PAST MEDICAL HISTORY:  1.  Hypothyroidism.  2.  Chiari malformation status post decompression in 2005.   MEDICATIONS:  1.  Premarin.  2.  Synthroid.  3.  Aspirin a day.   SOCIAL HISTORY:  Patient is here with her father.  She does not smoke or  drink.  She is very concerned that if she misses another day of work she  will get fired.   FAMILY HISTORY:  Her father is alive and healthy.  Her mother has no history  of coronary artery disease, but has had a stroke.   REVIEW OF SYSTEMS:  As above, otherwise negative.   PHYSICAL EXAMINATION:  VITAL SIGNS:  Temperature 97.4, blood pressure  111/72, pulse 83,  respiratory rate 18, oxygen saturation 96% on room air.  GENERAL:  Patient is a well-nourished, well-developed, in no acute distress.  HEENT:  Normocephalic, atraumatic.  Pupils are equal, round, and reactive to  light.  Sclerae nonicteric.  Moist mucous membranes.  NECK:  Supple.  No JVD.  No carotid bruits.  LUNGS:  Clear to auscultation bilaterally without wheezes, rhonchi, or  rales.  CARDIOVASCULAR:  Regular rate and rhythm without murmurs, rubs, or gallops.  ABDOMEN:  Normal bowel sounds.  Soft, nontender, nondistended.  CHEST:  Chest wall is with reproducible tenderness.  EXTREMITIES:  No clubbing, cyanosis, edema.  Pulses are intact.  NEUROLOGIC:  Alert and oriented.  Cranial nerves and sensory/motor  examination are intact.  PSYCHIATRIC:  Normal affect.  SKIN:  No rash.   EKG shows normal sinus rhythm with flipped T-waves anteriorly.  Chest x-ray  from February 17 is negative.   ASSESSMENT/PLAN:  Chest pain.  As this has been occurring for the past week  and she has presented twice to the emergency room, I did recommend that  patient stay to rule out myocardial infarction and get a stress test in the  morning.  She refuses as she is worried about her job and she is leaving  against medical advice as she promises, however, to call Dr. Sloan Leiter in the  morning to schedule an outpatient stress test.  I have recommended that she  continue her aspirin a day.      CLS/MEDQ  D:  07/08/2004  T:  07/09/2004  Job:  [id number]   cc:   Dario Guardian, M.D.  [address]  [place]  [place] [address]  Fax: [phone]   Annabell Howells, MD  Fax: [phone]

## 2010-09-27 NOTE — Discharge Summary (Signed)
NAME:  HITTFontaine, Kossman NO.:  [id number]   MEDICAL RECORD NO.:  [id number]          PATIENT TYPE:  INP   LOCATION:  3733                         FACILITY:  [facility]   PHYSICIAN:  Sherin Quarry, MD      DATE OF BIRTH:  06-02-1955   DATE OF ADMISSION:  01/03/2005  DATE OF DISCHARGE:  01/04/2005                                 DISCHARGE SUMMARY   [name] [name] is a 55 year old lady who presented to the [facility] Emergency Room  on January 03, 2005. She described waking from sleep the night before with a  feeling of palpitations associated with a grabbing chest discomfort and  marked weakness. The symptoms persisted in spite of taking a nap and  therefore she came to the [facility] Emergency Room. There she was seen by Dr.  Derenda Mis.   On physical exam, her temperature is 97.5, blood pressure 102/61, pulse was  79, respirations 16, O2 saturation 99%. HEENT exam was within normal limits.  The chest was clear. Cardiovascular exam revealed normal S1-S2 without rubs,  murmurs or gallops. The abdomen was benign. Neurologic testing and  examination of the extremities were within normal limits.   On admission, the patient was admitted to telemetry. She was given Lopressor  12.5 milligrams every 12 hours as well as Lovenox 1 mg/kg every 12 hours.  Her thyroid medication was continued.   Subsequently, laboratory studies obtained included CBC which was within  normal limits. The CMP showed a sodium 141, potassium 4.3, creatinine 1.1,  BUN 11, glucose was 92. Liver profile was normal. Serial cardiac markers  were negative. A lipid profile was remarkable for elevated triglyceride  level of 724. Total cholesterol was 256. TSH was 3.3 which is in the  therapeutic range.   The patient was seen by Dr. Meade Maw for [facility]. It should be  noted that her fibrin degradation products were negative. On January 04, 2005, she underwent Cardiolite stress test. The exercise portion  showed no  inducible ischemia by EKG criteria. Verbal report of the Cardiolite portion  of the stress test showed normal ejection fraction estimated to be 60-70%,  no evidence of ischemia, normal cardiac function. It was the consensus of  myself and Dr. Fraser Din that the patient most likely had chest pain of  musculoskeletal origin. She was reassured. Of note was that the patient's  roommate noted that during the night when she was sleeping she seemed to  have breath-holding spells and periods of erratic breathing. She also was  noted to have some snoring. Her roommate recognized these symptoms as  possible indication of sleep apnea. On January 04, 2005, the patient was  discharged.   DISCHARGE DIAGNOSIS:  1.  Atypical chest pain probably musculoskeletal.  2.  Chronic fatigue symptoms.  3.  Possible sleep apnea as described above.  4.  Hypothyroidism.  5.  Hypertriglyceridemia.   PLAN:  [name] [name] was advised to follow up with Dr. Katrinka Blazing for the purpose of  discussing her sleep disturbance and a possible need for outpatient sleep  study. She was also  advised to discuss her chronic fatigue symptoms with Dr.  Katrinka Blazing. The patient is in need of further education regarding following a low-  fat diet and achieving weight reduction.  She will continue Synthroid 112 mcg daily and aspirin. She is currently  taking Premarin on a regular basis and I suggested that she discuss with Dr.  Katrinka Blazing whether to continue this medication on a long-term basis. Condition at  the time of discharge was good.           ______________________________  Sherin Quarry, MD     SY/MEDQ  D:  01/04/2005  T:  01/04/2005  Job:  [id number]   cc:   Dario Guardian, M.D.  Fax: [phone]   Armanda Magic, M.D.  [address]  [place], [place] [address]  Fax: [phone]

## 2010-09-27 NOTE — H&P (Signed)
NAME:  [name] [name], [name]                 ACCOUNT NO.:  [id number]   MEDICAL RECORD NO.:  [id number]          PATIENT TYPE:  EMS   LOCATION:  MAJO                         FACILITY:  [facility]   PHYSICIAN:  Melissa L. Ladona Ridgel, MD  DATE OF BIRTH:  1955-10-21   DATE OF ADMISSION:  01/03/2005  DATE OF DISCHARGE:                                HISTORY & PHYSICAL   CHIEF COMPLAINT:  Chest pain since Wednesday.   PRIMARY CARE PHYSICIAN:  Dario Guardian, M.D., although the patient has  not returned for care there in some time.   HISTORY OF PRESENT ILLNESS:  The patient is a 55 year old white female who  awakened from sleep with rapid heart rate.  She states she thought the dog  was shaking the bed but then she felt that the bed was shaking related to  her fast heart rate.  She states that she developed some shortness of breath  with this, some nausea, and then some grabbing-type chest pain that went  away.  She then states that she called in from work and just rested for this  day.  Later in the day she had further sensation of something sitting on her  chest.  The alternating symptoms were on and off, not relieved by anything  other than time, not exacerbated by anything, although the patient states  when she would lie back and relax that the symptoms would improve.  The  patient states that thereafter she was extremely weak and fatigued.  She  slept excessively on and off for the next two days and decided to come into  the emergency room today if her symptoms persisted.  Please note, the  patient has been evaluated in 2004 for similar symptoms.  A stress test was  negative.  She was placed on aspirin, which she has not been taking.   REVIEW OF SYSTEMS:  She does have a significant amount of stress at home  right now.  She denied fever or chills.  She has had some weight gain.  She  states she has some dysuria but no urgency.  She denies hematuria.  She  denies diarrhea and constipation.  She  denies melena, hematochezia.  She  does have on and off headache, dizziness, changes in her vision.  She was  given tension headache medication and muscle relaxers on August 7 for this  symptom at [facility].   PAST MEDICAL HISTORY:  1.  Degenerative disk disease.  2.  Chiari malformation.  3.  Hypothyroidism.   PAST SURGICAL HISTORY:  Chiari malformation surgery.   SOCIAL HISTORY:  She denies tobacco.  She denies ethanol.  She is a  recovering crack addict.  She quit two years ago.   FAMILY HISTORY:  Mom is living with stroke, arthritis, and has a family  history on her side of peripheral vascular disease and MI.  Dad is living  with unknown medical condition.   ALLERGIES:  MORPHINE, which gives her palpitations, and ANTIHISTAMINES,  which also give her palpitations.   MEDICATIONS:  1.  Premarin 0.625 mg daily.  2.  Synthroid 112 mcg daily.  3.  Aspirin, which she has not been taking.  4.  A multivitamin.  5.  Calcium.   PHYSICAL EXAMINATION:  VITAL SIGNS:  Temperature is 97.5, blood pressure is  102/61, pulse is 79, respirations 16, saturations 99%.  GENERAL:  This is a well-developed, well-nourished, moderately obese white  female in no acute distress.  She does appear anxious.  HEENT:  She is normocephalic, atraumatic.  Pupils equal, round, and reactive  to light.  Extraocular movements are intact.  Mucous membranes are moist.  NECK:  Supple.  There is no JVD, no lymph nodes and no carotid bruits.  CHEST:  Moderately tender to touch but clear to auscultation.  There is no  rhonchi, rales or wheezing.  CARDIOVASCULAR:  Regular rate and rhythm, positive S1, S2, no S3, S4, and  very distant heart sounds.  ABDOMEN:  Soft, nontender, nondistended, with positive bowel sounds.  EXTREMITIES:  No clubbing, cyanosis, or edema.  NEUROLOGIC:  Cranial nerves II-XII are intact.  Power is 5/5.  DTRs are 2+.  Plantars are downgoing.  Sensation is grossly intact.   LABORATORY  DATA:  Her white count is 9.0, hemoglobin is 12.6, hematocrit  36.5, platelets are 319.  Her point of care enzymes are negative x2.  PT is  12.9 with an INR of 1.0, her PTT is 29.  Her sodium is 141, potassium 4.3,  chloride is 109, CO2 23, glucose 92, BUN 11, creatinine is 11.1.  LFTs are  within normal limits.  Chest x-ray shows bibasilar scarring with no obvious  infiltrates.  EKG from an outside source shows diffuse ST-T wave changes,  which were noted in previous EKGs.  Heart rate was 77-79 beats per minute.   ASSESSMENT AND PLAN:  This is a 55 year old with a history of atypical chest  pain who had stress testing back in 2004, which was negative.  She has not  been following closely with her primary care physician but has complaints of  persistent atypical chest discomfort, most recently on Wednesday awakened  from sleep with palpitations, chest heaviness and fatigue with associated  shortness of breath and nausea.   1.  Cardiovascular.  Atypical chest pain with a sensation of palpitations,      possibly consistent with unstable angina.  At this time I will start her      on aspirin and a beta blocker if she will tolerate that from a blood      pressure perspective, and Lovenox 1 mg/kg q.12h.  I have consulted with      [facility] to consider her for repeat stress versus      catheterization based on their assessment.  Please note, her echo      completed back in 2004 was reviewed and showed an ejection fraction of      55% and no obvious hypokinesis.  2.  Pulmonary.  Her chest x-ray is negative.  Past chest x-rays have had      cardiomegaly.  At this time will check a D-dimer and if positive      consider CT if positive.  3.  Gastrointestinal.  She has a history of reflux.  Her current symptoms do      not seem compatible with that.  I do      not see any indications for starting her on Protonix at this time. 4.  Genitourinary.  She has some dysuria.  Will check a UA, C&S.  5.  Endocrine.  Because of her palpitations, will check her TSH.  Will also      check a fasting lipid panel.      Melissa L. Ladona Ridgel, MD  Electronically Signed     MLT/MEDQ  D:  01/03/2005  T:  01/03/2005  Job:  [id number]   cc:   Dario Guardian, M.D.  Fax: [phone]

## 2010-09-27 NOTE — Op Note (Signed)
NAME:  [name] [name], [name] [name]                 ACCOUNT NO.:  [id number]   MEDICAL RECORD NO.:  [id number]          PATIENT TYPE:  INP   LOCATION:  3006                         FACILITY:  [facility]   PHYSICIAN:  Hewitt Shorts, M.D.DATE OF BIRTH:  1955-12-05   DATE OF PROCEDURE:  09/04/2005  DATE OF DISCHARGE:                                 OPERATIVE REPORT   PREOPERATIVE DIAGNOSIS:  L4-5 spondylosis and degenerative disk disease with  translational and rotational scoliosis, spondylitic disk herniation and  stenosis and resulting radiculopathy.   POSTOPERATIVE DIAGNOSIS:  L4-5 spondylosis and degenerative disk disease  with translational and rotational scoliosis, spondylitic disk herniation and  stenosis and resulting radiculopathy.   PROCEDURE:  1.  Bilateral L4-5 lumbar decompression including laminotomy, facetectomy      and microdiskectomy with L4-5 transverse posterior lumbar interbody      fusion with AVS Peek interbody implant and VITOSS with bone marrow      aspirate.  2.  Bilateral L4-5 posterolateral arthrodesis with 90-D posterior      instrumentation, INFUSE and VITOSS with bone marrow aspirate with      microdissection.   SURGEON:  Hewitt Shorts, M.D.   ASSISTANT:  Danae Orleans. Venetia Maxon, M.D.   ANESTHESIA:  General endotracheal.   INDICATION:  The patient is a 55 year old woman who presented with low back  and bilateral lower extremity pain and was found to have advanced  degenerative changes at the L4-5 level with spondylosis and degenerative  disk disease and resulting translational and rotational scoliosis with  spondylitic disk herniation, significant stenosis and resulting  radiculopathy.  Decision was made to proceed with decompression and  arthrodesis.   DESCRIPTION OF PROCEDURE:  The patient was brought to the operating room and  placed under general endotracheal anesthesia.  The patient was turned to a  prone position and the lumbar region was prepped with  Betadine soaping  solution and draped in a sterile fashion.  The midline was infiltrated with  a local anesthetic with epinephrine.  An x-ray was taken and the L4-5 level  identified and a midline incision was made over the L4-5 level and carried  down through the subcutaneous tissue.  Bipolar cautery and electrocautery  were used to maintain hemostasis.  Dissection was carried down to the lumbar  fascia, which was incised bilaterally, and the paraspinal muscles were  dissected from the spinous process and lamina in a subperiosteal fashion.  A  self-retaining retractor was placed and another x-ray was taken and the L4-5  interlaminar space identified.  The microscope was then draped and brought  into the field to provide additional magnification, illumination and  visualization and a decompression was performed using microdissection and  microsurgical technique.  We first exposed the lateral gutters, exposing the  transverse processes of L4 and L5 and then using the XMax drill and  subsequently Kerrison punches, bilateral L4-5 laminotomies and facetectomies  were performed.  The ligamentum flavum was carefully removed and we  identified the thecal sac and exiting L4 and L5 nerve roots bilaterally.  We  then gently retracted the  thecal sac and nerve root and exposed the annulus.  Overlying veins were coagulated and divided and we incised the annulus and  entered into the disk space.  The disk space was significantly more  collapsed on the right side than the left side.  There was significant  spondylitic overgrowth in the posterior aspect of the vertebral bodies at  the level of the disk space and this was carefully removed and we were able  to decompress the existing nerve roots bilaterally.  A thorough diskectomy  was performed and then we prepared the endplates of the vertebral bodies for  interbody fusion.   Dr. Venetia Maxon reviewed the studies and felt that the patient was a good  candidate  for a transverse posterior lumbar interbody fusion because of the  particular collapse on the right side of the disk space.  We therefore sized  the intervertebral disk space and selected a 9-mm implant; we used an  implant with 0 lordosis and it was packed with VITOSS with bone marrow  aspirate.   To do so, the C-arm fluoroscope was draped and brought into the field.  We  identified the pedicle entry site for the left L5 pedicle and the left L5  pedicle was probed down into the vertebral body and we aspirated about 10 mL  of bone marrow aspirate and injected it over a 10-mL strip of VITOSS.  The  VITOSS was then packed into the implant and then implant was carefully  positioned in the intervertebral disk space to be placed from the left side.  We placed a laminar spreader on the right side, which facilitated our  placement of the implant.  Then using various tamps, the implant was gently  turned to a transverse position and moved as closed towards the midline as  feasible; this was done with C-arm fluoroscopic guidance.  We then packed  the remainder of the intervertebral disk space with additional VITOSS with  bone marrow aspirate.  We then went ahead and identified the other 3 pedicle  entry sides and using C-arm fluoroscopic guidance, each of the pedicles was  probed.  They were examined with a ball probe, no cutouts were found, and  then each of the pedicles was tapped with a 5.25-mm tap, again examined with  a ball probe; again, good threading was noted and no cutouts were found and  we then placed 5.75 x 45-mm screws on the left side at L4, bilaterally at L5  and a 5.75 x 40-mm screw on the right side at L4.  Once all 4 screws were in  place, we then cut rods to the appropriate length and rods were placed on  either side within the screw heads, locking caps were placed and a final  tightening was done of all 4 locking caps.  The transverse processes had been previously exposed, they  were decorticated, and then we placed the  INFUSE and VITOSS with bone marrow aspirate over the transverse processes  and through the lateral gutters.  The wound was then examined once again.  We examined the spinal canal; the thecal sac and nerve roots were well-  decompressed.  None of the bone graft material was within the spinal canal  and then we proceeded with closure.  The deep fascia was closed with  interrupted undyed 1 Vicryl sutures, Scarpa's fascia was closed with  interrupted undyed 1 Vicryl sutures, the subcutaneous and subcuticular  layers were closed with interrupted and inverted 2-0 undyed Vicryl sutures  and the skin was reapproximated with DERMABOND.  The wound was dressed with  Adaptic and sterile gauze and Hypafix.  Subsequent to surgery, the patient  was turned back to a supine position, reversed from the anesthetic,  extubated and transferred to the recovery room for further care, where she  was noted to be moving all 4 extremities to commands.      Hewitt Shorts, M.D.  Electronically Signed     RWN/MEDQ  D:  09/04/2005  T:  09/05/2005  Job:  [id number]

## 2010-10-21 ENCOUNTER — Emergency Department (HOSPITAL_COMMUNITY)
Admission: EM | Admit: 2010-10-21 | Discharge: 2010-10-21 | Disposition: A | Discharge status: Home / Self Care | Payer: 59 | Attending: Emergency Medicine | Admitting: Emergency Medicine

## 2010-10-21 DIAGNOSIS — H53149 Visual discomfort, unspecified: Secondary | ICD-10-CM | POA: Insufficient documentation

## 2010-10-21 DIAGNOSIS — E039 Hypothyroidism, unspecified: Secondary | ICD-10-CM | POA: Insufficient documentation

## 2010-10-21 DIAGNOSIS — R112 Nausea with vomiting, unspecified: Secondary | ICD-10-CM | POA: Insufficient documentation

## 2010-10-21 DIAGNOSIS — G43909 Migraine, unspecified, not intractable, without status migrainosus: Secondary | ICD-10-CM | POA: Insufficient documentation

## 2010-10-21 DIAGNOSIS — Z79899 Other long term (current) drug therapy: Secondary | ICD-10-CM | POA: Insufficient documentation

## 2010-10-21 DIAGNOSIS — E669 Obesity, unspecified: Secondary | ICD-10-CM | POA: Insufficient documentation

## 2010-10-21 DIAGNOSIS — R42 Dizziness and giddiness: Secondary | ICD-10-CM | POA: Insufficient documentation

## 2010-10-21 DIAGNOSIS — F411 Generalized anxiety disorder: Secondary | ICD-10-CM | POA: Insufficient documentation

## 2011-01-17 ENCOUNTER — Emergency Department (HOSPITAL_COMMUNITY): Payer: 59

## 2011-01-17 ENCOUNTER — Inpatient Hospital Stay (HOSPITAL_COMMUNITY)
Admission: EM | Admit: 2011-01-17 | Discharge: 2011-01-19 | DRG: 313 | Disposition: A | Discharge status: Home / Self Care | Payer: 59 | Attending: Internal Medicine | Admitting: Internal Medicine

## 2011-01-17 DIAGNOSIS — M79609 Pain in unspecified limb: Secondary | ICD-10-CM

## 2011-01-17 DIAGNOSIS — D72829 Elevated white blood cell count, unspecified: Secondary | ICD-10-CM | POA: Diagnosis present

## 2011-01-17 DIAGNOSIS — IMO0002 Reserved for concepts with insufficient information to code with codable children: Secondary | ICD-10-CM | POA: Diagnosis present

## 2011-01-17 DIAGNOSIS — G5600 Carpal tunnel syndrome, unspecified upper limb: Secondary | ICD-10-CM | POA: Diagnosis present

## 2011-01-17 DIAGNOSIS — R0789 Other chest pain: Principal | ICD-10-CM | POA: Diagnosis present

## 2011-01-17 DIAGNOSIS — E039 Hypothyroidism, unspecified: Secondary | ICD-10-CM | POA: Diagnosis present

## 2011-01-17 DIAGNOSIS — Z7982 Long term (current) use of aspirin: Secondary | ICD-10-CM

## 2011-01-17 DIAGNOSIS — M4802 Spinal stenosis, cervical region: Secondary | ICD-10-CM | POA: Diagnosis present

## 2011-01-17 DIAGNOSIS — Z79899 Other long term (current) drug therapy: Secondary | ICD-10-CM

## 2011-01-17 DIAGNOSIS — E785 Hyperlipidemia, unspecified: Secondary | ICD-10-CM | POA: Diagnosis present

## 2011-01-17 DIAGNOSIS — I498 Other specified cardiac arrhythmias: Secondary | ICD-10-CM | POA: Diagnosis present

## 2011-01-17 DIAGNOSIS — R209 Unspecified disturbances of skin sensation: Secondary | ICD-10-CM | POA: Diagnosis present

## 2011-01-17 DIAGNOSIS — E669 Obesity, unspecified: Secondary | ICD-10-CM | POA: Diagnosis present

## 2011-01-17 DIAGNOSIS — R072 Precordial pain: Secondary | ICD-10-CM

## 2011-01-17 LAB — DIFFERENTIAL
Basophils Absolute: 0.1 10*3/uL (ref 0.0–0.1)
Eosinophils Absolute: 0.5 10*3/uL (ref 0.0–0.7)
Eosinophils Relative: 4 % (ref 0–5)
Lymphs Abs: 3.6 10*3/uL (ref 0.7–4.0)
Monocytes Absolute: 0.9 10*3/uL (ref 0.1–1.0)
Monocytes Relative: 8 % (ref 3–12)
Neutrophils Relative %: 53 % (ref 43–77)

## 2011-01-17 LAB — POCT I-STAT, CHEM 8
Calcium, Ion: 1.2 mmol/L (ref 1.12–1.32)
Chloride: 106 mEq/L (ref 96–112)
Hemoglobin: 13.9 g/dL (ref 12.0–15.0)
Potassium: 4.6 mEq/L (ref 3.5–5.1)
Sodium: 139 mEq/L (ref 135–145)
TCO2: 23 mmol/L (ref 0–100)

## 2011-01-17 LAB — URINALYSIS, ROUTINE W REFLEX MICROSCOPIC
Hgb urine dipstick: NEGATIVE
Nitrite: NEGATIVE
Protein, ur: NEGATIVE mg/dL
Specific Gravity, Urine: 1.017 (ref 1.005–1.030)
Urobilinogen, UA: 0.2 mg/dL (ref 0.0–1.0)

## 2011-01-17 LAB — PHOSPHORUS: Phosphorus: 4.7 mg/dL — ABNORMAL HIGH (ref 2.3–4.6)

## 2011-01-17 LAB — POCT I-STAT TROPONIN I

## 2011-01-17 LAB — COMPREHENSIVE METABOLIC PANEL
AST: 30 U/L (ref 0–37)
Albumin: 3.9 g/dL (ref 3.5–5.2)
BUN: 20 mg/dL (ref 6–23)
Creatinine, Ser: 0.94 mg/dL (ref 0.50–1.10)
Glucose, Bld: 105 mg/dL — ABNORMAL HIGH (ref 70–99)

## 2011-01-17 LAB — TROPONIN I: Troponin I: <0.3 ng/mL (ref <0.30)

## 2011-01-17 LAB — CK TOTAL AND CKMB (NOT AT ARMC)
Relative Index: 3.7 — ABNORMAL HIGH (ref 0.0–2.5)
Total CK: 123 U/L (ref 7–177)

## 2011-01-17 LAB — CBC
HCT: 40.4 % (ref 36.0–46.0)
MCH: 29.3 pg (ref 26.0–34.0)
MCV: 85.8 fL (ref 78.0–100.0)
Platelets: 295 10*3/uL (ref 150–400)
RBC: 4.71 MIL/uL (ref 3.87–5.11)
RDW: 12.4 % (ref 11.5–15.5)
WBC: 10.8 10*3/uL — ABNORMAL HIGH (ref 4.0–10.5)

## 2011-01-17 LAB — URINE MICROSCOPIC-ADD ON

## 2011-01-17 LAB — MAGNESIUM: Magnesium: 2.1 mg/dL (ref 1.5–2.5)

## 2011-01-17 LAB — TSH: TSH: 0.059 u[IU]/mL — ABNORMAL LOW (ref 0.350–4.500)

## 2011-01-18 ENCOUNTER — Inpatient Hospital Stay (HOSPITAL_COMMUNITY): Payer: 59

## 2011-01-18 LAB — CK TOTAL AND CKMB (NOT AT ARMC): Total CK: 100 U/L (ref 7–177)

## 2011-01-18 LAB — URINE CULTURE: Colony Count: 25000

## 2011-01-18 LAB — LIPID PANEL
Cholesterol: 191 mg/dL (ref 0–200)
HDL: 30 mg/dL — ABNORMAL LOW (ref >39)
Total CHOL/HDL Ratio: 6.4 RATIO

## 2011-01-18 LAB — TROPONIN I: Troponin I: <0.3 ng/mL (ref <0.30)

## 2011-01-19 NOTE — H&P (Signed)
NAME:  [name] [name], [name] [name] NO.:  [id number]  MEDICAL RECORD NO.:  [id number]  LOCATION:  [facility]                         FACILITY:  [facility]  PHYSICIAN:  Kathlen Mody, MD       DATE OF BIRTH:  [birth year]  DATE OF ADMISSION:  01/17/2011 DATE OF DISCHARGE:                             HISTORY & PHYSICAL   PRIMARY CARE PHYSICIAN:  Dario Guardian, MD  CHIEF COMPLAINT:  Chest tightness and left arm pain since 2 days.  HISTORY OF PRESENT ILLNESS:  [name] [name] is a 55-year lady with the past medical history of hypothyroidism, obesity, and degenerative disk disease who started noticing that her left palm started aching, dull ache since 2 days associated with some substernal left-sided chest tightness occurring  at rest and on activity and spontaneously resolved by itself.  Chest tightness is associated with some diaphoresis.  No nausea or shortness of breath.  The patient denies any fever, chills, or cough.  She had these episodes in the past.  She had a cardiac workup about 6-7 years ago by [facility] and was told that her cardiac workup was normal.  The patient denies any orthopnea or PND.  Denies any syncopal episodes in the past.  She does have episodes of lightheadedness.  As per the patient, she says that she is exhausted from her two jobs and that must have contributed to her chest tightness. She denies any abdominal pain, vomiting, or diarrhea.  She does have burning micturition and frequent urination.  She denies any headache or blurry vision.  Denies any tingling or numbness anywhere in her body. The patient denies any focal weakness.  She does complain of right lower extremity swelling that she noticed over the last few days.  She denies any calf tenderness.  REVIEW OF SYSTEMS:  See HPI, otherwise negative.  PAST MEDICAL HISTORY: 1. Degenerative disk disease. 2. Obesity. 3. Hypothyroidism.  PAST SURGICAL HISTORY:  Lumbar diskectomy, cervical spine  surgeries, appendectomy, tonsillectomy, and hysterectomy.  FAMILY HISTORY:  Significant for CVA.  SOCIAL HISTORY:  The patient lives by herself.  Denies smoking, EtOH, or recreational drug use.  ALLERGIES:  The patient is allergic to MORPHINE SULFATE and ANTIHISTAMINES.  HOME MEDICATIONS:  The patient is on potassium chloride, calcium supplements, Synthroid, and multivitamin.  PHYSICAL EXAMINATION:  VITAL SIGNS:  She is afebrile with temp of 97.8, pulse of 70, blood pressure 125/75, respiratory rate 20, and saturating 96% on room air. GENERAL:  She is alert, afebrile, oriented x3, comfortable, in no acute distress. HEENT:  Normocephalic and atraumatic.  Pupils are reacting to light and accommodation.  No JVD.  No scleral icterus. CARDIOVASCULAR:  S1 and S2 heard.  Regular rate and rhythm.  No murmurs or rubs. RESPIRATORY:  Chest is clear to auscultation bilaterally.  No wheezing or rhonchi. ABDOMEN:  Soft, nontender, and nondistended.  Bowel sounds are heard. EXTREMITIES:  Right lower extremity slight swelling of the right lowerextremity when compared to the left lower extremity.  No pedal edema, cyanosis, or clubbing. NEUROLOGICAL:  Alert and oriented x3.  No focal deficits.  DIAGNOSTIC STUDIES:  The patient had a 2-view chest x-ray which shows  no acute cardiopulmonary abnormality.  EKG:  Normal sinus rhythm with a T- wave inversion in V2.  PERTINENT LABORATORY DATA:  The patient had a point-of-care troponin which was negative.  CBC significant for WBC count of 10.8 and electrolytes within normal limits.  ASSESSMENT/PLAN:  This is a 55 year old lady with a history of hypothyroidism and obesity admitted for left arm pain and chest tightness.  The patient is being admitted to Hospitalist Service for further evaluation for chest pain to rule out acute coronary syndrome. She will be admitted to tele.  We will get cardiac enzymes q.6 h. x3 . We will get a 2-D echocardiogram  without contrast.  If needed cardiology consult from [facility] can be called.  We will get a fasting lipid profile and a 12-lead EKG in a.m.  Right lower extremity swelling.  We will get a venous duplex to rule out DVT.  Mild leukocytosis with WBC count of 10.8.  Chest x-ray negative for pneumonia.  We will get a urinalysis, culture and sensitivity to see if she has urinary tract infection.  Deep vein thrombosis prophylaxis.  Start her on Lovenox subcutaneous injection.  The patient is a full code.          ______________________________ Kathlen Mody, MD     VA/MEDQ  D:  01/17/2011  T:  01/17/2011  Job:  [id number]  Electronically Signed by Kathlen Mody MD on 01/19/2011 09:08:48 PM

## 2011-02-20 NOTE — Discharge Summary (Signed)
NAME:  [name] [name], [name] [name] NO.:  [id number]  MEDICAL RECORD NO.:  [id number]  LOCATION:  4741                         FACILITY:  [facility]  PHYSICIAN:  Altha Harm, MDDATE OF BIRTH:  08/21/1955  DATE OF ADMISSION:  01/17/2011 DATE OF DISCHARGE:  01/19/2011                              DISCHARGE SUMMARY   DISCHARGE DISPOSITION:  Home.  FINAL DISCHARGE DIAGNOSES: 1. Chest pain, noncardiac. 2. Cervical spinal stenosis with impingement. 3. Left upper extremity dysesthesia, likely related to cervical spinal     impingement. 4. Hyperlipidemia - elevated triglycerides. 5. History of carpal tunnel syndrome. 6. Hypothyroidism. 7. Chiari malformation status post occipital decompression.  DISCHARGE MEDICATIONS: 1. Fenofibrate 160 mg p.o. daily. 2. Omeprazole 20 mg p.o. daily. 3. Aspirin enteric-coated 81 mg p.o. daily. 4. Calcium carbonate with vitamin D 1 tablet p.o. daily. 5. Multivitamin 1 tablet p.o. daily. 6. Potassium chloride 1 tablet p.o. daily. 7. Synthroid 37.5 mcg p.o. daily.  CONSULTANTS:  None.  PROCEDURES:  None.  DIAGNOSTIC STUDIES: 1. Two-view chest x-ray on admission, which shows no acute     cardiopulmonary abnormality. 2  MR of the cervical spine which shows prior suboccipital decompression with a small pseudomeningocele along the decompression site, but without residual mass effect on the cerebellum.  Impression; cervical spondylosis and degenerative disk disease contributing mild to moderate degree of impingement at C2-C3, C3-C4, C5-C6, and C6-C7.  STUDIES:  2-D echocardiogram on September 7, which shows left ventricular systolic function, normal with an ejection fraction of 60- 65%.  PRIMARY CARE PHYSICIAN:  Dario Guardian, M.D.  NEUROSURGEON:  Hewitt Shorts, M.D.  CODE STATUS:  Full code.  ALLERGIES: 1. MORPHINE SULFATE. 2. ANTIHISTAMINES.  CHIEF COMPLAINT:  Dysesthesias down the left arm x2 days.  HISTORY OF  PRESENT ILLNESS:  Please refer to the H and P by Dr. Blake Divine for details of the HPI.  However, my interview of the patient revealed that she has been having dysesthesias down her left upper extremities for some time.  This has been greater than 3 weeks.  However, the dysesthesias got worse and that led her to come into the hospital.  The patient actually states that she came because of her arm and that she had just a little bit of pain on the right side of the chest with movement of the right upper extremity, which she mentioned in the emergency room.  However, she states that she really did not even notice this before coming to the hospital and that she has had no further issue with it since being hospitalized.  The overwhelming symptom that the patient does have is dysesthesias and some weakness in the left upper extremity.  The patient does have a Chiari malformation and has had decompression surgery at the occipital region.  She also has had recent carpal tunnel surgery in both upper extremities done in February of this year by Dr. Newell Coral.  The patient had no fever, chills.  She has had no nausea, vomiting, or diarrhea.  She has had no dizziness.  She has had no palpitations, no loss of consciousness, no cough, no dysuria, no frequency, no urgency, no abdominal pain, no  weight loss.  HOSPITAL COURSE: 1. Chest pain.  The patient came in.  The focus of our investigations     was on the chest pain which per the patient's estimation was really     an afterthought.  Nevertheless, the patient had her enzymes cycled,     which turned out to be negative.  The patient had no arrhythmias on     the telemetry except the patient did have some bradycardia while     asleep.  She had an atrial pause of 2.4 seconds.  However, this was     asymptomatic and the patient only knows about this because she was     told by the staff.  At this time, I feel that there is no further     workup necessary.  I  will defer to Dr. Merri Brunette to refer the     patient to a cardiologist if she feels necessary as an outpatient     for any further workup.  She did have a 2-D echocardiogram, which     shows normal left ventricular function and in light of this I do     not believe there is any urgent or acute intervention needed to be     had here in the hospital and I again will refer to her outpatient     physician. 2. Left upper extremity dysesthesias and weakness.  The patient had an     MRI of the C spine, which revealed impingement at C2-C3, C3-C4, C5-     C6, and C6-57.  However, there was no mention of nerve compression.     The patient also has meningocele at the area of the decompression.     However, there is no mass effect on the cerebellum.  I have asked     the patient to go back to see Dr. Newell Coral as an outpatient and I     have asked her to get an appointment for Dr. Newell Coral for next     week.  At this time, I do not believe that there are any acute     interventions that needs to be embarked upon here in the hospital     and the patient is already under the care of a neurosurgeon who     knows her well and this can be followed up with him. 3. Hyperlipidemia.  The patient was found to have high triglycerides.     She has been started on Fenofibrate and she should have her     cholesterol rechecked within 6 weeks for further titration of     medication. 4. Hypothyroidism.  TSH was checked.  The patient is adequately     supplemented and we will continue her Synthroid.  At the time of discharge the patient was stable.  She has been up and ambulating without any problems.  She is well appearing and in no distress.  The patient has been given instruction on protecting her neck.  During her work, she has a lot of cervical extension and she has been advised to refrain from cervical extension as much as possible.  PHYSICAL EXAMINATION:  VITAL SIGNS:  Temperature is 97.5, heart rate  82, blood pressure 104/70, respiratory rate 18, O2 sats are 97% on room air. HEENT:  Normocephalic, atraumatic.  Pupils are equally round and reactive to light and accommodation.  Extraocular movements are intact. Oropharynx is moist.  No exudate, erythema, or lesions are noted. NECK:  Trachea is midline.  No masses, no thyromegaly, no JVD, no carotid bruits. CARDIOVASCULAR:  She has got a normal S1 and S2.  No murmurs, rubs, or gallops are noted.  PMI is nondisplaced.  No heaves or thrills on palpation. ABDOMEN:  Obese, soft, nontender, nondistended.  No masses.  No hepatosplenomegaly is noted. EXTREMITIES:  No clubbing, cyanosis, or edema. NEUROLOGICAL:  She has no focal neurological deficits except for the complaints of intermittent dysesthesia of the left upper extremity. Cranial nerves II-XII are grossly intact.  DTRs are 2+ bilaterally in the upper and lower extremities. PSYCHIATRIC:  She is alert and oriented x3.  Good insight and cognition. Good recent and remote recall. LYMPH NODE SURVEY:  She has got no cervical, axillary, or inguinal lymphadenopathy noted.  DIETARY RESTRICTIONS:  The patient should be on a heart-healthy diet.  PHYSICAL RESTRICTIONS:  Activity as tolerated.  The patient has been advised against cervical extension as much as possible.  FOLLOWUP:  The patient is to follow up with an appointment with Dr. Newell Coral in a week and with her primary care physician, Dr. Merri Brunette within a week and it will be Dr. Michaelle Copas discretion as to whether or not she feels that this patient should be referred to Cardiology for any further workup.  Total time for this discharge process including face-to-face time, approximately 40 minutes.     Altha Harm, MD     MAM/MEDQ  D:  01/19/2011  T:  01/19/2011  Job:  [id number]  Electronically Signed by Marthann Schiller MD on 02/20/2011 07:19:39 AM

## 2011-04-27 ENCOUNTER — Emergency Department (HOSPITAL_BASED_OUTPATIENT_CLINIC_OR_DEPARTMENT_OTHER)
Admission: EM | Admit: 2011-04-27 | Discharge: 2011-04-27 | Disposition: A | Discharge status: Home / Self Care | Payer: 59 | Attending: Emergency Medicine | Admitting: Emergency Medicine

## 2011-04-27 ENCOUNTER — Encounter: Payer: Self-pay | Admitting: Emergency Medicine

## 2011-04-27 DIAGNOSIS — K219 Gastro-esophageal reflux disease without esophagitis: Secondary | ICD-10-CM | POA: Insufficient documentation

## 2011-04-27 DIAGNOSIS — R109 Unspecified abdominal pain: Secondary | ICD-10-CM | POA: Insufficient documentation

## 2011-04-27 DIAGNOSIS — M5137 Other intervertebral disc degeneration, lumbosacral region: Secondary | ICD-10-CM | POA: Insufficient documentation

## 2011-04-27 DIAGNOSIS — R209 Unspecified disturbances of skin sensation: Secondary | ICD-10-CM | POA: Insufficient documentation

## 2011-04-27 DIAGNOSIS — M549 Dorsalgia, unspecified: Secondary | ICD-10-CM | POA: Insufficient documentation

## 2011-04-27 DIAGNOSIS — M79609 Pain in unspecified limb: Secondary | ICD-10-CM | POA: Insufficient documentation

## 2011-04-27 DIAGNOSIS — M51379 Other intervertebral disc degeneration, lumbosacral region without mention of lumbar back pain or lower extremity pain: Secondary | ICD-10-CM | POA: Insufficient documentation

## 2011-04-27 HISTORY — DX: Cardiac arrhythmia, unspecified: I49.9

## 2011-04-27 HISTORY — DX: Gastro-esophageal reflux disease without esophagitis: K21.9

## 2011-04-27 HISTORY — DX: Other congenital malformations of spine, not associated with scoliosis: Q76.49

## 2011-04-27 HISTORY — DX: Disorder of thyroid, unspecified: E07.9

## 2011-04-27 HISTORY — DX: Other intervertebral disc degeneration, lumbar region: M51.36

## 2011-04-27 HISTORY — DX: Other intervertebral disc degeneration, lumbar region without mention of lumbar back pain or lower extremity pain: M51.369

## 2011-04-27 MED ORDER — DIAZEPAM 5 MG PO TABS: 5.0000 mg | ORAL_TABLET | Freq: Every day | ORAL | Status: AC

## 2011-04-27 MED ORDER — IBUPROFEN 800 MG PO TABS
800.0000 mg | ORAL_TABLET | Freq: Once | ORAL | Status: AC
  Administered 2011-04-27: 800 mg via ORAL
  Filled 2011-04-27: qty 1

## 2011-04-27 MED ORDER — IBUPROFEN 800 MG PO TABS: 800.0000 mg | ORAL_TABLET | Freq: Three times a day (TID) | ORAL | Status: AC

## 2011-04-27 MED ORDER — DIAZEPAM 5 MG PO TABS
5.0000 mg | ORAL_TABLET | Freq: Once | ORAL | Status: AC
  Administered 2011-04-27: 5 mg via ORAL
  Filled 2011-04-27: qty 1

## 2011-04-27 MED ORDER — OXYCODONE-ACETAMINOPHEN 5-325 MG PO TABS: 1.0000 | ORAL_TABLET | Freq: Three times a day (TID) | ORAL | Status: DC | PRN

## 2011-04-27 MED ORDER — OXYCODONE-ACETAMINOPHEN 5-325 MG PO TABS
1.0000 | ORAL_TABLET | Freq: Once | ORAL | Status: AC
  Administered 2011-04-27: 1 via ORAL
  Filled 2011-04-27: qty 1

## 2011-04-27 NOTE — ED Provider Notes (Signed)
History     CSN: [id number] Arrival date & time: 04/27/2011 12:35 PM   First MD Initiated Contact with Patient 04/27/11 1237      Chief Complaint  Patient presents with  . Back Pain  . Groin Pain  . Leg Pain  . Numbness    (Consider location/radiation/quality/duration/timing/severity/associated sxs/prior treatment) HPI The patient presents with one month of back and leg pain. She notes that the onset was atraumatic, and without memorable precipitants. Since onset she has had persistent pain focally about the right lower back with radiation down the right buttock and right leg. She notes the pain is also present in the right inguinal crease. The pain is worse with motion, awakens her at night, described as sharp. No attempts at analgesia thus far. No loss of distal sensation, no unilateral weakness. No bowel or bladder dysfunction, no fevers, no emesis Past Medical History  Diagnosis Date  . DDD (degenerative disc disease), lumbar   . Spine malformation   . Thyroid disease   . GERD (gastroesophageal reflux disease)   . Irregular heart rate     Past Surgical History  Procedure Date  . Back surgery     l4 and l5  . Abdominal hysterectomy   . Appendectomy   . Tonsillectomy     History reviewed. No pertinent family history.  History  Substance Use Topics  . Smoking status: Never Smoker   . Smokeless tobacco: Not on file  . Alcohol Use: No    OB History    Grav Para Term Preterm Abortions TAB SAB Ect Mult Living                  Review of Systems  Constitutional:       HPI  HENT:       HPI otherwise negative  Eyes: Negative.   Respiratory:       HPI, otherwise negative  Cardiovascular:       HPI, otherwise nmegative  Gastrointestinal: Negative for vomiting.  Genitourinary:       HPI, otherwise negative  Musculoskeletal:       HPI, otherwise negative  Skin: Negative.   Neurological: Negative for syncope.    Allergies  Morphine and Benadryl  allergy  Home Medications   Current Outpatient Rx  Name Route Sig Dispense Refill  . ASPIRIN 81 MG PO TABS Oral Take 81 mg by mouth daily.      . FENOFIBRATE 160 MG PO TABS Oral Take 160 mg by mouth daily.      Marland Kitchen LEVOTHYROXINE SODIUM 100 MCG PO TABS Oral Take 137 mcg by mouth daily.      . MULTI-VITAMIN/MINERALS PO TABS Oral Take 1 tablet by mouth daily.      Marland Kitchen OMEPRAZOLE 20 MG PO CPDR Oral Take 20 mg by mouth daily.        BP 122/70  Pulse 80  Temp(Src) 98 F (36.7 C) (Oral)  Resp 20  Ht 5' 6.5" (1.689 m)  Wt 224 lb (101.606 kg)  BMI 35.61 kg/m2  SpO2 96%  Physical Exam  Constitutional: She is oriented to person, place, and time. She appears well-developed and well-nourished.  HENT:  Head: Normocephalic and atraumatic.  Eyes: Conjunctivae and EOM are normal.  Cardiovascular: Normal rate and regular rhythm.   Pulmonary/Chest: Effort normal and breath sounds normal. No stridor.  Abdominal: Soft. She exhibits no distension.  Musculoskeletal:       Tenderness to palpation about the right inferior paraspinal region. Positive straight  leg test on right. Range of motion of hip is within normal limits the pain is elicited on exam. Range of motion of the knee is similar. Strength in right extremity is 5 out of 5 in all dimensions/symmetric with left though with provocative testing the patient winces against resistance  Neurological: She is alert and oriented to person, place, and time. No cranial nerve deficit. She exhibits normal muscle tone. Coordination normal.  Skin: Skin is warm and dry.  Psychiatric: She has a normal mood and affect.    ED Course  Procedures (including critical care time)  Labs Reviewed - No data to display No results found.   No diagnosis found.    MDM  This 55 year old female presents with one month of right-sided back pain. On exam the patient is in no distress though with range of motion exercises she is in notable discomfort.  Absent focal neuro  deficits, or complaints this episode likely represents acute musculoskeletal etiology this was discussed with the patient. She'll be discharged with analgesics, relaxants, orthopedics followup.        Gerhard Munch, MD 04/27/11 1409

## 2011-04-27 NOTE — ED Notes (Signed)
Pt having lower right sided back pain radiating to right leg and right groin.  Now having some numbness and tingling to right thigh and back of right leg.  No dysuria.

## 2011-04-27 NOTE — ED Notes (Signed)
Care plan reviewed with pt and family at side with safe use of meds

## 2011-04-27 NOTE — ED Notes (Signed)
Care and safe use of meds reviewed

## 2011-09-02 ENCOUNTER — Other Ambulatory Visit: Payer: Self-pay | Admitting: Gastroenterology

## 2011-09-02 DIAGNOSIS — R198 Other specified symptoms and signs involving the digestive system and abdomen: Secondary | ICD-10-CM

## 2011-09-02 DIAGNOSIS — F458 Other somatoform disorders: Secondary | ICD-10-CM

## 2011-09-02 DIAGNOSIS — R0989 Other specified symptoms and signs involving the circulatory and respiratory systems: Secondary | ICD-10-CM

## 2011-09-10 ENCOUNTER — Ambulatory Visit
Admission: RE | Admit: 2011-09-10 | Discharge: 2011-09-10 | Disposition: A | Discharge status: Home / Self Care | Payer: 59 | Source: Ambulatory Visit | Attending: Gastroenterology | Admitting: Gastroenterology

## 2011-09-10 DIAGNOSIS — R198 Other specified symptoms and signs involving the digestive system and abdomen: Secondary | ICD-10-CM

## 2011-09-10 DIAGNOSIS — R0989 Other specified symptoms and signs involving the circulatory and respiratory systems: Secondary | ICD-10-CM

## 2012-02-29 ENCOUNTER — Emergency Department: Payer: Self-pay | Admitting: Emergency Medicine

## 2012-06-12 ENCOUNTER — Emergency Department: Payer: Self-pay | Admitting: Emergency Medicine

## 2012-11-05 ENCOUNTER — Ambulatory Visit
Admission: RE | Admit: 2012-11-05 | Discharge: 2012-11-05 | Disposition: A | Discharge status: Home / Self Care | Payer: 59 | Source: Ambulatory Visit | Attending: Sports Medicine | Admitting: Sports Medicine

## 2012-11-05 ENCOUNTER — Other Ambulatory Visit: Payer: Self-pay | Admitting: Sports Medicine

## 2012-11-05 DIAGNOSIS — M25551 Pain in right hip: Secondary | ICD-10-CM

## 2012-11-05 DIAGNOSIS — W19XXXA Unspecified fall, initial encounter: Secondary | ICD-10-CM

## 2013-01-03 ENCOUNTER — Encounter (HOSPITAL_COMMUNITY): Payer: Self-pay | Admitting: Pharmacy Technician

## 2013-01-03 NOTE — Pre-Procedure Instructions (Signed)
[name] [name]  01/03/2013   Your procedure is scheduled on:  January 12, 2013 at 1:30 PM  Report to [facility] at 10:30 AM.  Call this number if you have problems the morning of surgery: [phone]   Remember:   Do not eat food or drink liquids after midnight.   Take these medicines the morning of surgery with A SIP OF WATER: levothyroxine (SYNTHROID, LEVOTHROID), omeprazole (PRILOSEC), sertraline (ZOLOFT)  Stop all Vitamins, Herbal Medications, Fish Oil, Aspirin and Non-steroidal (Ibuprofen, Motrin, Aleve, Napoxen) on January 05, 2013      Do not wear jewelry, make-up or nail polish.  Do not wear lotions, powders, or perfumes. You may wear deodorant.  Do not shave 48 hours prior to surgery. Men may shave face and neck.  Do not bring valuables to the hospital.  [facility] is not responsible                   for any belongings or valuables.  Contacts, dentures or bridgework may not be worn into surgery.  Leave suitcase in the car. After surgery it may be brought to your room.  For patients admitted to the hospital, checkout time is 11:00 AM the day of  discharge.     Special Instructions: Shower using CHG 2 nights before surgery and the night before surgery.  If you shower the day of surgery use CHG.  Use special wash - you have one bottle of CHG for all showers.  You should use approximately 1/3 of the bottle for each shower.   Please read over the following fact sheets that you were given: Pain Booklet, Coughing and Deep Breathing, Blood Transfusion Information, MRSA Information and Surgical Site Infection Prevention

## 2013-01-04 ENCOUNTER — Encounter (HOSPITAL_COMMUNITY)
Admission: RE | Admit: 2013-01-04 | Discharge: 2013-01-04 | Disposition: A | Discharge status: Home / Self Care | Payer: 59 | Source: Ambulatory Visit | Attending: Orthopedic Surgery | Admitting: Orthopedic Surgery

## 2013-01-04 ENCOUNTER — Encounter (HOSPITAL_COMMUNITY): Payer: Self-pay

## 2013-01-04 DIAGNOSIS — Z01812 Encounter for preprocedural laboratory examination: Secondary | ICD-10-CM | POA: Insufficient documentation

## 2013-01-04 HISTORY — DX: Depression, unspecified: F32.A

## 2013-01-04 HISTORY — DX: Major depressive disorder, single episode, unspecified: F32.9

## 2013-01-04 HISTORY — DX: Hypothyroidism, unspecified: E03.9

## 2013-01-04 HISTORY — DX: Sleep apnea, unspecified: G47.30

## 2013-01-04 HISTORY — DX: Pneumonia, unspecified organism: J18.9

## 2013-01-04 LAB — BASIC METABOLIC PANEL
Calcium: 10.3 mg/dL (ref 8.4–10.5)
Creatinine, Ser: 1.19 mg/dL — ABNORMAL HIGH (ref 0.50–1.10)
GFR calc Af Amer: 58 mL/min — ABNORMAL LOW (ref >90)
GFR calc non Af Amer: 50 mL/min — ABNORMAL LOW (ref >90)

## 2013-01-04 LAB — CBC
MCH: 29.6 pg (ref 26.0–34.0)
MCV: 90.1 fL (ref 78.0–100.0)
Platelets: 353 10*3/uL (ref 150–400)
RDW: 13.1 % (ref 11.5–15.5)

## 2013-01-04 LAB — TYPE AND SCREEN: ABO/RH(D): O POS

## 2013-01-04 LAB — SURGICAL PCR SCREEN: Staphylococcus aureus: NEGATIVE

## 2013-01-04 NOTE — Progress Notes (Addendum)
Office called for orders ekg reqd from pcp dr Merri Brunette Sleep study in epic 06

## 2013-01-04 NOTE — Progress Notes (Signed)
Pt chart left for [place], [place] (anesthesia) to review EKG

## 2013-01-05 NOTE — Progress Notes (Signed)
Anesthesia Chart Review:  Patient is a 57 year old female scheduled for right THA on 01/12/13 by Dr. Eulah Pont.  History includes obesity, non-smoker, hypothyroidism, OSA without CPAP use, GERD, depression, hysterectomy, "spine malformation" with history of L4-5 decompression/arthrodesis '07, suboccipital craniectomy, C1-2 laminectomy and duraplasty for Chiari malformation '05, PNA, "irregular heart rate" without mention of afib.    PCP is Dr. Merri Brunette who saw patient for surgical clearance on 12/07/12.  Her EKG then showed SR, RSR prime in V1, negative T waves in anterior leads, consider ischemia.  Dr. Katrinka Blazing felt the EKG was stable since 2010, but did have cardiologist Dr. Anne Fu review.  Per telephone encounter outlined in her note, "..unless pt is having SOB or chest pain no issues noted.."  (Patient has multiple EKGs in [facility] dating back to at least 2002--some of which show more non-specific T wave abnormality, but many also showing prominent anterior T wave inversion as evidenced in old EKGs from 07/22/00 and 01/03/05.  Dr. Michaelle Copas note indicates that patient had a normal nuclear stress test on 07/10/09 (report requested for [facility] as available).   Echo on 01/17/11 showed normal LV systolic function, EF 60-65%, trivial PR/TR.  Labs from 01/04/13 noted.  Orders were pending at the time of her PAT appointment.  She will need PT/PTT on the day of surgery--and any additional labs that are ordered by the surgeon.  EKG appears stable.  If no new cardiopulmonary symptoms and additional lab results are reasonable then I would anticipate that she could proceed as planned.  Velna Ochs [facility]/Anesthesiology Phone [phone] 01/05/2013 3:42 PM

## 2013-01-06 ENCOUNTER — Encounter: Payer: Self-pay | Admitting: Orthopedic Surgery

## 2013-01-06 ENCOUNTER — Other Ambulatory Visit: Payer: Self-pay | Admitting: Orthopedic Surgery

## 2013-01-06 NOTE — Progress Notes (Signed)
Requested orders. 

## 2013-01-06 NOTE — H&P (Signed)
TOTAL HIP ADMISSION H&P  Patient is admitted for right total hip arthroplasty.  Subjective:  Chief Complaint: right hip pain  HPI: [name] [name], 57 y.o. female, has a history of pain and functional disability in the right hip(s) due to arthritis and patient has failed non-surgical conservative treatments for greater than 12 weeks to include NSAID's and/or analgesics, flexibility and strengthening excercises, supervised PT with diminished ADL's post treatment, use of assistive devices and activity modification.  Onset of symptoms was gradual starting >10 years ago with gradually worsening course since that time.The patient noted no past surgery on the right hip(s).  Patient currently rates pain in the right hip at 10 out of 10 with activity. Patient has night pain, worsening of pain with activity and weight bearing, trendelenberg gait, pain that interfers with activities of daily living, pain with passive range of motion and crepitus. Patient has evidence of subchondral cysts, subchondral sclerosis and joint space narrowing by imaging studies. This condition presents safety issues increasing the risk of falls.   There is no current active infection.  Patient Active Problem List   Diagnosis Date Noted  . HYPOTHYROIDISM 11/07/2009  . CALLUSES, LEFT FOOT 11/07/2009  . FASCIITIS, PLANTAR 11/07/2009   Past Medical History  Diagnosis Date  . DDD (degenerative disc disease), lumbar   . Spine malformation   . Thyroid disease   . GERD (gastroesophageal reflux disease)   . Irregular heart rate   . Hypothyroidism   . Depression   . Pneumonia     hx  . Sleep apnea     unable to wear mask-sleep study 3-4 yrs ago    Past Surgical History  Procedure Laterality Date  . Abdominal hysterectomy    . Appendectomy    . Tonsillectomy    . Back surgery      l4 and l5  . Craniectomy suboccipital w/ cervical laminectomy / chiari  08  . Carpal tunnel release Bilateral     Current Outpatient  Prescriptions on File Prior to Visit  Medication Sig Dispense Refill  . aspirin 81 MG tablet Take 81 mg by mouth daily.        Marland Kitchen CALCIUM PO Take 1 tablet by mouth daily.      . fenofibrate 160 MG tablet Take 160 mg by mouth daily.        Marland Kitchen levothyroxine (SYNTHROID, LEVOTHROID) 125 MCG tablet Take 125 mcg by mouth daily before breakfast.      . Multiple Vitamins-Minerals (MULTIVITAMIN WITH MINERALS) tablet Take 1 tablet by mouth daily.        . nabumetone (RELAFEN) 500 MG tablet Take 500 mg by mouth daily.      Marland Kitchen omeprazole (PRILOSEC) 20 MG capsule Take 20 mg by mouth daily.        . sertraline (ZOLOFT) 50 MG tablet Take 50 mg by mouth daily.       No current facility-administered medications on file prior to visit.    (Not in a hospital admission) Allergies  Allergen Reactions  . Morphine     REACTION: Bradycardia, Hypotension  . Diphenhydramine Hcl Palpitations    History  Substance Use Topics  . Smoking status: Never Smoker   . Smokeless tobacco: Not on file     Comment: 2004 last crack   occ alcohol  . Alcohol Use: Yes    History reviewed. No pertinent family history.   Review of Systems  Musculoskeletal: Positive for joint pain.  All other systems reviewed and are  negative.    Objective:  Physical Exam  Constitutional: She is oriented to person, place, and time. She appears well-developed and well-nourished.  HENT:  Head: Normocephalic and atraumatic.  Eyes: EOM are normal. Pupils are equal, round, and reactive to light.  Neck: Neck supple.  Cardiovascular: Normal rate, regular rhythm, normal heart sounds and intact distal pulses.   Respiratory: Effort normal and breath sounds normal.  GI: Soft. Bowel sounds are normal.  Genitourinary:  NA  Musculoskeletal:  Right hip Trendelenburg component on the right.  Virtually no motion of the right hip because of pain and arthritis.  Left hip has good motion. Negative log roll.  For the most part she is neurovascularly  intact distally with negative straight leg raising.  There is some decreased lumbar motion, however is not extreme.  A lot of deconditioning coupled with her exogenous obesity.    Neurological: She is alert and oriented to person, place, and time.  Skin: Skin is warm.  Psychiatric: She has a normal mood and affect. Her behavior is normal.    Vital signs in last 24 hours: BP-119/75, Pulse-67, Temp- 97.5 Labs:   Estimated body mass index is 35.93 kg/(m^2) as calculated from the following:   Height as of 01/04/13: 5\' 6"  (1.676 m).   Weight as of 01/04/13: 100.925 kg (222 lb 8 oz).   Imaging Review Plain radiographs demonstrate severe degenerative joint disease of the right hip(s). The bone quality appears to be good for age and reported activity level.  Assessment/Plan:  End stage arthritis, right hip(s)  The patient history, physical examination, clinical judgement of the provider and imaging studies are consistent with end stage degenerative joint disease of the right hip(s) and total hip arthroplasty is deemed medically necessary. The treatment options including medical management, injection therapy, arthroscopy and arthroplasty were discussed at length. The risks and benefits of total hip arthroplasty were presented and reviewed. The risks due to aseptic loosening, infection, stiffness, dislocation/subluxation,  thromboembolic complications and other imponderables were discussed.  The patient acknowledged the explanation, agreed to proceed with the plan and consent was signed. Patient is being admitted for inpatient treatment for surgery, pain control, PT, OT, prophylactic antibiotics, VTE prophylaxis, progressive ambulation and ADL's and discharge planning.The patient is planning to be discharged to skilled nursing facility

## 2013-01-11 MED ORDER — CEFAZOLIN SODIUM-DEXTROSE 2-3 GM-% IV SOLR
2.0000 g | INTRAVENOUS | Status: AC
  Administered 2013-01-12: 2 g via INTRAVENOUS
  Filled 2013-01-11: qty 50

## 2013-01-11 NOTE — Progress Notes (Signed)
Left at message that pt needs to arrive to Short Stay at 1145.I did leave a message asking her to call and confirm that she is aware of new time

## 2013-01-12 ENCOUNTER — Inpatient Hospital Stay (HOSPITAL_COMMUNITY): Payer: 59

## 2013-01-12 ENCOUNTER — Inpatient Hospital Stay (HOSPITAL_COMMUNITY): Payer: 59 | Admitting: Anesthesiology

## 2013-01-12 ENCOUNTER — Encounter (HOSPITAL_COMMUNITY): Payer: Self-pay | Admitting: *Deleted

## 2013-01-12 ENCOUNTER — Encounter (HOSPITAL_COMMUNITY)
Admission: RE | Disposition: A | Discharge status: Skilled Nursing Facility | Payer: Self-pay | Source: Ambulatory Visit | Attending: Orthopedic Surgery

## 2013-01-12 ENCOUNTER — Inpatient Hospital Stay (HOSPITAL_COMMUNITY)
Admission: RE | Admit: 2013-01-12 | Discharge: 2013-01-15 | DRG: 470 | Disposition: A | Discharge status: Skilled Nursing Facility | Payer: 59 | Source: Ambulatory Visit | Attending: Orthopedic Surgery | Admitting: Orthopedic Surgery

## 2013-01-12 ENCOUNTER — Encounter (HOSPITAL_COMMUNITY): Payer: Self-pay | Admitting: Vascular Surgery

## 2013-01-12 DIAGNOSIS — Z7982 Long term (current) use of aspirin: Secondary | ICD-10-CM

## 2013-01-12 DIAGNOSIS — Z79899 Other long term (current) drug therapy: Secondary | ICD-10-CM

## 2013-01-12 DIAGNOSIS — K219 Gastro-esophageal reflux disease without esophagitis: Secondary | ICD-10-CM | POA: Diagnosis present

## 2013-01-12 DIAGNOSIS — M1611 Unilateral primary osteoarthritis, right hip: Secondary | ICD-10-CM

## 2013-01-12 DIAGNOSIS — E039 Hypothyroidism, unspecified: Secondary | ICD-10-CM | POA: Diagnosis present

## 2013-01-12 DIAGNOSIS — G4730 Sleep apnea, unspecified: Secondary | ICD-10-CM | POA: Diagnosis present

## 2013-01-12 DIAGNOSIS — M1610 Unilateral primary osteoarthritis, unspecified hip: Principal | ICD-10-CM | POA: Diagnosis present

## 2013-01-12 DIAGNOSIS — M169 Osteoarthritis of hip, unspecified: Principal | ICD-10-CM | POA: Diagnosis present

## 2013-01-12 DIAGNOSIS — F329 Major depressive disorder, single episode, unspecified: Secondary | ICD-10-CM | POA: Diagnosis present

## 2013-01-12 DIAGNOSIS — F3289 Other specified depressive episodes: Secondary | ICD-10-CM | POA: Diagnosis present

## 2013-01-12 DIAGNOSIS — M51379 Other intervertebral disc degeneration, lumbosacral region without mention of lumbar back pain or lower extremity pain: Secondary | ICD-10-CM | POA: Diagnosis present

## 2013-01-12 DIAGNOSIS — Z888 Allergy status to other drugs, medicaments and biological substances status: Secondary | ICD-10-CM

## 2013-01-12 DIAGNOSIS — D62 Acute posthemorrhagic anemia: Secondary | ICD-10-CM | POA: Diagnosis not present

## 2013-01-12 DIAGNOSIS — M5137 Other intervertebral disc degeneration, lumbosacral region: Secondary | ICD-10-CM | POA: Diagnosis present

## 2013-01-12 HISTORY — PX: TOTAL HIP ARTHROPLASTY: SHX124

## 2013-01-12 LAB — URINALYSIS, ROUTINE W REFLEX MICROSCOPIC
Glucose, UA: NEGATIVE mg/dL
Hgb urine dipstick: NEGATIVE
Ketones, ur: NEGATIVE mg/dL
Protein, ur: NEGATIVE mg/dL
pH: 7.5 (ref 5.0–8.0)

## 2013-01-12 LAB — COMPREHENSIVE METABOLIC PANEL
AST: 61 U/L — ABNORMAL HIGH (ref 0–37)
Albumin: 4.1 g/dL (ref 3.5–5.2)
Alkaline Phosphatase: 59 U/L (ref 39–117)
BUN: 14 mg/dL (ref 6–23)
Chloride: 104 mEq/L (ref 96–112)
Potassium: 4.1 mEq/L (ref 3.5–5.1)
Sodium: 138 mEq/L (ref 135–145)
Total Bilirubin: 0.5 mg/dL (ref 0.3–1.2)
Total Protein: 7.3 g/dL (ref 6.0–8.3)

## 2013-01-12 LAB — CBC WITH DIFFERENTIAL/PLATELET
Basophils Absolute: 0.1 10*3/uL (ref 0.0–0.1)
Basophils Relative: 1 % (ref 0–1)
Eosinophils Absolute: 0.2 10*3/uL (ref 0.0–0.7)
Hemoglobin: 13.8 g/dL (ref 12.0–15.0)
MCH: 30.5 pg (ref 26.0–34.0)
MCHC: 34.5 g/dL (ref 30.0–36.0)
Neutro Abs: 6.3 10*3/uL (ref 1.7–7.7)
Neutrophils Relative %: 63 % (ref 43–77)
Platelets: 330 10*3/uL (ref 150–400)
RDW: 12.8 % (ref 11.5–15.5)

## 2013-01-12 LAB — URINE MICROSCOPIC-ADD ON

## 2013-01-12 LAB — PROTIME-INR: INR: 1.01 (ref 0.00–1.49)

## 2013-01-12 SURGERY — ARTHROPLASTY, HIP, TOTAL,POSTERIOR APPROACH
Anesthesia: General | Site: Hip | Laterality: Right | Wound class: Clean

## 2013-01-12 MED ORDER — METOCLOPRAMIDE HCL 5 MG/ML IJ SOLN: 10.0000 mg | Freq: Once | INTRAMUSCULAR | Status: DC | PRN

## 2013-01-12 MED ORDER — BUPIVACAINE LIPOSOME 1.3 % IJ SUSP
20.0000 mL | INTRAMUSCULAR | Status: DC
  Filled 2013-01-12: qty 20

## 2013-01-12 MED ORDER — CHLORHEXIDINE GLUCONATE 4 % EX LIQD: 60.0000 mL | Freq: Once | CUTANEOUS | Status: DC

## 2013-01-12 MED ORDER — GLYCOPYRROLATE 0.2 MG/ML IJ SOLN
INTRAMUSCULAR | Status: DC | PRN
  Administered 2013-01-12: 0.4 mg via INTRAVENOUS

## 2013-01-12 MED ORDER — PROPOFOL 10 MG/ML IV BOLUS
INTRAVENOUS | Status: DC | PRN
  Administered 2013-01-12: 160 mg via INTRAVENOUS

## 2013-01-12 MED ORDER — FENTANYL CITRATE 0.05 MG/ML IJ SOLN
INTRAMUSCULAR | Status: DC | PRN
  Administered 2013-01-12: 50 ug via INTRAVENOUS
  Administered 2013-01-12 (×2): 100 ug via INTRAVENOUS

## 2013-01-12 MED ORDER — NEOSTIGMINE METHYLSULFATE 1 MG/ML IJ SOLN
INTRAMUSCULAR | Status: DC | PRN
  Administered 2013-01-12: 2 mg via INTRAVENOUS

## 2013-01-12 MED ORDER — LEVOTHYROXINE SODIUM 125 MCG PO TABS
125.0000 ug | ORAL_TABLET | Freq: Every day | ORAL | Status: DC
  Administered 2013-01-13 – 2013-01-15 (×3): 125 ug via ORAL
  Filled 2013-01-12 (×4): qty 1

## 2013-01-12 MED ORDER — SERTRALINE HCL 50 MG PO TABS
50.0000 mg | ORAL_TABLET | Freq: Every day | ORAL | Status: DC
  Administered 2013-01-13 – 2013-01-15 (×3): 50 mg via ORAL
  Filled 2013-01-12 (×3): qty 1

## 2013-01-12 MED ORDER — METOCLOPRAMIDE HCL 10 MG PO TABS: 5.0000 mg | ORAL_TABLET | Freq: Three times a day (TID) | ORAL | Status: DC | PRN

## 2013-01-12 MED ORDER — OXYCODONE HCL 5 MG PO TABS
5.0000 mg | ORAL_TABLET | ORAL | Status: DC | PRN
  Administered 2013-01-13 – 2013-01-15 (×8): 10 mg via ORAL
  Filled 2013-01-12 (×8): qty 2

## 2013-01-12 MED ORDER — PHENOL 1.4 % MT LIQD: 1.0000 | OROMUCOSAL | Status: DC | PRN

## 2013-01-12 MED ORDER — SODIUM CHLORIDE 0.9 % IJ SOLN
INTRAMUSCULAR | Status: DC | PRN
  Administered 2013-01-12: 18:00:00

## 2013-01-12 MED ORDER — SUCCINYLCHOLINE CHLORIDE 20 MG/ML IJ SOLN
INTRAMUSCULAR | Status: DC | PRN
  Administered 2013-01-12: 100 mg via INTRAVENOUS

## 2013-01-12 MED ORDER — CELECOXIB 200 MG PO CAPS
200.0000 mg | ORAL_CAPSULE | Freq: Two times a day (BID) | ORAL | Status: DC
  Administered 2013-01-12 – 2013-01-15 (×6): 200 mg via ORAL
  Filled 2013-01-12 (×7): qty 1

## 2013-01-12 MED ORDER — LACTATED RINGERS IV SOLN
INTRAVENOUS | Status: DC
  Administered 2013-01-12: 20 mL/h via INTRAVENOUS

## 2013-01-12 MED ORDER — EPHEDRINE SULFATE 50 MG/ML IJ SOLN
INTRAMUSCULAR | Status: DC | PRN
  Administered 2013-01-12: 10 mg via INTRAVENOUS

## 2013-01-12 MED ORDER — HYDROMORPHONE HCL PF 1 MG/ML IJ SOLN
0.5000 mg | INTRAMUSCULAR | Status: DC | PRN
  Administered 2013-01-12: 1 mg via INTRAVENOUS
  Administered 2013-01-13 (×2): 0.5 mg via INTRAVENOUS
  Administered 2013-01-13 (×2): 1 mg via INTRAVENOUS
  Filled 2013-01-12 (×5): qty 1

## 2013-01-12 MED ORDER — OXYCODONE HCL 5 MG PO TABS: 5.0000 mg | ORAL_TABLET | Freq: Once | ORAL | Status: DC | PRN

## 2013-01-12 MED ORDER — SENNA 8.6 MG PO TABS
1.0000 | ORAL_TABLET | Freq: Two times a day (BID) | ORAL | Status: DC
  Administered 2013-01-12 – 2013-01-15 (×6): 8.6 mg via ORAL
  Filled 2013-01-12 (×8): qty 1

## 2013-01-12 MED ORDER — NABUMETONE 500 MG PO TABS
500.0000 mg | ORAL_TABLET | Freq: Every day | ORAL | Status: DC
  Administered 2013-01-12 – 2013-01-15 (×4): 500 mg via ORAL
  Filled 2013-01-12 (×4): qty 1

## 2013-01-12 MED ORDER — MENTHOL 3 MG MT LOZG
1.0000 | LOZENGE | OROMUCOSAL | Status: DC | PRN
  Filled 2013-01-12: qty 9

## 2013-01-12 MED ORDER — ACETAMINOPHEN 325 MG PO TABS: 650.0000 mg | ORAL_TABLET | Freq: Four times a day (QID) | ORAL | Status: DC | PRN

## 2013-01-12 MED ORDER — OXYCODONE HCL 5 MG/5ML PO SOLN: 5.0000 mg | Freq: Once | ORAL | Status: DC | PRN

## 2013-01-12 MED ORDER — BUPIVACAINE HCL 0.5 % IJ SOLN
INTRAMUSCULAR | Status: DC | PRN
  Administered 2013-01-12: 10 mL

## 2013-01-12 MED ORDER — ASPIRIN EC 325 MG PO TBEC
325.0000 mg | DELAYED_RELEASE_TABLET | Freq: Every day | ORAL | Status: DC
  Administered 2013-01-13 – 2013-01-15 (×3): 325 mg via ORAL
  Filled 2013-01-12 (×4): qty 1

## 2013-01-12 MED ORDER — LACTATED RINGERS IV SOLN
INTRAVENOUS | Status: DC | PRN
  Administered 2013-01-12: 16:00:00 via INTRAVENOUS

## 2013-01-12 MED ORDER — METOCLOPRAMIDE HCL 5 MG/ML IJ SOLN
5.0000 mg | Freq: Three times a day (TID) | INTRAMUSCULAR | Status: DC | PRN
  Administered 2013-01-13: 10 mg via INTRAVENOUS
  Filled 2013-01-12: qty 2

## 2013-01-12 MED ORDER — HYDROMORPHONE HCL PF 1 MG/ML IJ SOLN
0.2500 mg | INTRAMUSCULAR | Status: DC | PRN
  Administered 2013-01-12 (×2): 0.5 mg via INTRAVENOUS

## 2013-01-12 MED ORDER — LIDOCAINE HCL (CARDIAC) 20 MG/ML IV SOLN
INTRAVENOUS | Status: DC | PRN
  Administered 2013-01-12: 50 mg via INTRAVENOUS

## 2013-01-12 MED ORDER — SODIUM CHLORIDE 0.9 % IR SOLN
Status: DC | PRN
  Administered 2013-01-12: 1000 mL

## 2013-01-12 MED ORDER — PANTOPRAZOLE SODIUM 40 MG PO TBEC
40.0000 mg | DELAYED_RELEASE_TABLET | Freq: Every day | ORAL | Status: DC
  Administered 2013-01-13 – 2013-01-15 (×3): 40 mg via ORAL
  Filled 2013-01-12 (×3): qty 1

## 2013-01-12 MED ORDER — ZOLPIDEM TARTRATE 5 MG PO TABS: 5.0000 mg | ORAL_TABLET | Freq: Every evening | ORAL | Status: DC | PRN

## 2013-01-12 MED ORDER — ONDANSETRON HCL 4 MG/2ML IJ SOLN
4.0000 mg | Freq: Four times a day (QID) | INTRAMUSCULAR | Status: DC | PRN
  Administered 2013-01-13: 4 mg via INTRAVENOUS
  Filled 2013-01-12: qty 2

## 2013-01-12 MED ORDER — METHOCARBAMOL 500 MG PO TABS
500.0000 mg | ORAL_TABLET | Freq: Four times a day (QID) | ORAL | Status: DC | PRN
  Administered 2013-01-13 – 2013-01-14 (×2): 500 mg via ORAL
  Filled 2013-01-12 (×2): qty 1

## 2013-01-12 MED ORDER — HYDROMORPHONE HCL PF 1 MG/ML IJ SOLN
INTRAMUSCULAR | Status: AC
  Filled 2013-01-12: qty 1

## 2013-01-12 MED ORDER — LACTATED RINGERS IV SOLN: INTRAVENOUS | Status: DC

## 2013-01-12 MED ORDER — BUPIVACAINE HCL (PF) 0.5 % IJ SOLN
INTRAMUSCULAR | Status: AC
  Filled 2013-01-12: qty 30

## 2013-01-12 MED ORDER — MIDAZOLAM HCL 5 MG/5ML IJ SOLN
INTRAMUSCULAR | Status: DC | PRN
  Administered 2013-01-12: 2 mg via INTRAVENOUS

## 2013-01-12 MED ORDER — POTASSIUM CHLORIDE IN NACL 20-0.9 MEQ/L-% IV SOLN
INTRAVENOUS | Status: DC
  Administered 2013-01-13 (×2): via INTRAVENOUS
  Filled 2013-01-12 (×9): qty 1000

## 2013-01-12 MED ORDER — ONDANSETRON HCL 4 MG PO TABS: 4.0000 mg | ORAL_TABLET | Freq: Four times a day (QID) | ORAL | Status: DC | PRN

## 2013-01-12 MED ORDER — METHOCARBAMOL 100 MG/ML IJ SOLN
500.0000 mg | Freq: Four times a day (QID) | INTRAVENOUS | Status: DC | PRN
  Filled 2013-01-12: qty 5

## 2013-01-12 MED ORDER — ROCURONIUM BROMIDE 100 MG/10ML IV SOLN
INTRAVENOUS | Status: DC | PRN
  Administered 2013-01-12: 20 mg via INTRAVENOUS

## 2013-01-12 MED ORDER — CEFAZOLIN SODIUM-DEXTROSE 2-3 GM-% IV SOLR
2.0000 g | Freq: Four times a day (QID) | INTRAVENOUS | Status: AC
  Administered 2013-01-12 – 2013-01-13 (×2): 2 g via INTRAVENOUS
  Filled 2013-01-12 (×2): qty 50

## 2013-01-12 MED ORDER — ACETAMINOPHEN 650 MG RE SUPP: 650.0000 mg | Freq: Four times a day (QID) | RECTAL | Status: DC | PRN

## 2013-01-12 SURGICAL SUPPLY — 73 items
BLADE SAW SAG 73X25 THK (BLADE) ×1
BLADE SAW SGTL 73X25 THK (BLADE) ×1 IMPLANT
BOOTCOVER CLEANROOM LRG (PROTECTIVE WEAR) ×2 IMPLANT
BRUSH FEMORAL CANAL (MISCELLANEOUS) IMPLANT
CATH URET WHISTLE 8FR 331008 (CATHETERS) ×1 IMPLANT
CLOTH BEACON ORANGE TIMEOUT ST (SAFETY) ×2 IMPLANT
COVER BACK TABLE 24X17X13 BIG (DRAPES) IMPLANT
COVER SURGICAL LIGHT HANDLE (MISCELLANEOUS) ×2 IMPLANT
DRAPE INCISE IOBAN 66X45 STRL (DRAPES) IMPLANT
DRAPE ORTHO SPLIT 77X108 STRL (DRAPES) ×4
DRAPE SURG ORHT 6 SPLT 77X108 (DRAPES) ×2 IMPLANT
DRAPE U-SHAPE 47X51 STRL (DRAPES) ×2 IMPLANT
DRILL BIT 7/64X5 (BIT) ×2 IMPLANT
DRSG PAD ABDOMINAL 8X10 ST (GAUZE/BANDAGES/DRESSINGS) ×3 IMPLANT
DURAPREP 26ML APPLICATOR (WOUND CARE) ×2 IMPLANT
ELECT BLADE 6.5 EXT (BLADE) IMPLANT
ELECT CAUTERY BLADE 6.4 (BLADE) ×2 IMPLANT
ELECT REM PT RETURN 9FT ADLT (ELECTROSURGICAL) ×2
ELECTRODE REM PT RTRN 9FT ADLT (ELECTROSURGICAL) ×1 IMPLANT
EVACUATOR 1/8 PVC DRAIN (DRAIN) IMPLANT
FACESHIELD LNG OPTICON STERILE (SAFETY) ×4 IMPLANT
GAUZE XEROFORM 5X9 LF (GAUZE/BANDAGES/DRESSINGS) ×2 IMPLANT
GLOVE BIO SURGEON STRL SZ8 (GLOVE) ×3 IMPLANT
GLOVE BIOGEL PI IND STRL 8 (GLOVE) ×1 IMPLANT
GLOVE BIOGEL PI INDICATOR 8 (GLOVE) ×3
GLOVE ORTHO TXT STRL SZ7.5 (GLOVE) ×3 IMPLANT
GLOVE SURG SS PI 8.0 STRL IVOR (GLOVE) ×1 IMPLANT
GOWN PREVENTION PLUS XLARGE (GOWN DISPOSABLE) ×2 IMPLANT
GOWN PREVENTION PLUS XXLARGE (GOWN DISPOSABLE) ×3 IMPLANT
GOWN STRL NON-REIN LRG LVL3 (GOWN DISPOSABLE) ×1 IMPLANT
GOWN STRL REIN 2XL XLG LVL4 (GOWN DISPOSABLE) ×1 IMPLANT
HANDPIECE INTERPULSE COAX TIP (DISPOSABLE)
HIP/CERM HD VIT E LINR LEV 1C ×1 IMPLANT
KIT BASIN OR (CUSTOM PROCEDURE TRAY) ×2 IMPLANT
KIT ROOM TURNOVER OR (KITS) ×2 IMPLANT
MANIFOLD NEPTUNE II (INSTRUMENTS) ×2 IMPLANT
NDL HYPO 25GX1X1/2 BEV (NEEDLE) ×1 IMPLANT
NEEDLE 22X1 1/2 (OR ONLY) (NEEDLE) ×1 IMPLANT
NEEDLE HYPO 25GX1X1/2 BEV (NEEDLE) ×2 IMPLANT
NS IRRIG 1000ML POUR BTL (IV SOLUTION) ×2 IMPLANT
PACK TOTAL JOINT (CUSTOM PROCEDURE TRAY) ×2 IMPLANT
PAD ARMBOARD 7.5X6 YLW CONV (MISCELLANEOUS) ×4 IMPLANT
PASSER SUT SWANSON 36MM LOOP (INSTRUMENTS) ×2 IMPLANT
PILLOW ABDUCTION HIP (SOFTGOODS) ×2 IMPLANT
PRESSURIZER FEMORAL UNIV (MISCELLANEOUS) IMPLANT
SET HNDPC FAN SPRY TIP SCT (DISPOSABLE) IMPLANT
SPONGE GAUZE 4X4 12PLY (GAUZE/BANDAGES/DRESSINGS) ×2 IMPLANT
SPONGE LAP 4X18 X RAY DECT (DISPOSABLE) IMPLANT
STAPLER VISISTAT 35W (STAPLE) ×3 IMPLANT
SUCTION FRAZIER TIP 10 FR DISP (SUCTIONS) ×1 IMPLANT
SUT FIBERWIRE #2 38 REV NDL BL (SUTURE) ×6
SUT VIC AB 0 CT1 27 (SUTURE) ×2
SUT VIC AB 0 CT1 27XBRD ANBCTR (SUTURE) IMPLANT
SUT VIC AB 1 CTX 36 (SUTURE) ×2
SUT VIC AB 1 CTX36XBRD ANBCTR (SUTURE) ×4 IMPLANT
SUT VIC AB 2-0 CT1 27 (SUTURE)
SUT VIC AB 2-0 CT1 TAPERPNT 27 (SUTURE) IMPLANT
SUT VIC AB 2-0 SH 27 (SUTURE) ×2
SUT VIC AB 2-0 SH 27XBRD (SUTURE) ×2 IMPLANT
SUT VIC AB 3-0 FS2 27 (SUTURE) ×1 IMPLANT
SUT VIC AB 3-0 SH 27 (SUTURE)
SUT VIC AB 3-0 SH 27X BRD (SUTURE) IMPLANT
SUT VLOC 180 0 24IN GS25 (SUTURE) ×1 IMPLANT
SUTURE FIBERWR#2 38 REV NDL BL (SUTURE) ×3 IMPLANT
SYR 30ML SLIP (SYRINGE) ×2 IMPLANT
SYR 50ML LL SCALE MARK (SYRINGE) ×1 IMPLANT
SYR CONTROL 10ML LL (SYRINGE) ×2 IMPLANT
TAPE CLOTH SURG 4X10 WHT LF (GAUZE/BANDAGES/DRESSINGS) ×1 IMPLANT
TOWEL OR 17X24 6PK STRL BLUE (TOWEL DISPOSABLE) ×2 IMPLANT
TOWEL OR 17X26 10 PK STRL BLUE (TOWEL DISPOSABLE) ×2 IMPLANT
TOWER CARTRIDGE SMART MIX (DISPOSABLE) IMPLANT
TRAY FOLEY CATH 16FRSI W/METER (SET/KITS/TRAYS/PACK) ×2 IMPLANT
WATER STERILE IRR 1000ML POUR (IV SOLUTION) ×5 IMPLANT

## 2013-01-12 NOTE — Transfer of Care (Signed)
Immediate Anesthesia Transfer of Care Note  Patient: [name] [name]  Procedure(s) Performed: Procedure(s): TOTAL HIP ARTHROPLASTY WITH AUTOGRAFT (Right)  Patient Location: PACU  Anesthesia Type:General  Level of Consciousness: awake and alert   Airway & Oxygen Therapy: Patient Spontanous Breathing and Patient connected to nasal cannula oxygen  Post-op Assessment: Report given to PACU RN and Post -op Vital signs reviewed and stable  Post vital signs: Reviewed and stable  Complications: No apparent anesthesia complications

## 2013-01-12 NOTE — Anesthesia Preprocedure Evaluation (Signed)
Anesthesia Evaluation  Patient identified by MRN, date of birth, ID band Patient awake    Reviewed: Allergy & Precautions, H&P , NPO status , Patient's Chart, lab work & pertinent test results, reviewed documented beta blocker date and time   Airway Mallampati: II TM Distance: >3 FB Neck ROM: full    Dental   Pulmonary sleep apnea and Continuous Positive Airway Pressure Ventilation , pneumonia -, resolved,  breath sounds clear to auscultation        Cardiovascular negative cardio ROS  Rhythm:regular     Neuro/Psych PSYCHIATRIC DISORDERS negative neurological ROS     GI/Hepatic Neg liver ROS, GERD-  Medicated and Controlled,  Endo/Other  Hypothyroidism Morbid obesity  Renal/GU negative Renal ROS  negative genitourinary   Musculoskeletal   Abdominal   Peds  Hematology negative hematology ROS (+)   Anesthesia Other Findings See surgeon's H&P   Reproductive/Obstetrics negative OB ROS                           Anesthesia Physical Anesthesia Plan  ASA: III  Anesthesia Plan: General   Post-op Pain Management:    Induction: Intravenous  Airway Management Planned: Oral ETT  Additional Equipment:   Intra-op Plan:   Post-operative Plan: Extubation in OR  Informed Consent: I have reviewed the patients History and Physical, chart, labs and discussed the procedure including the risks, benefits and alternatives for the proposed anesthesia with the patient or authorized representative who has indicated his/her understanding and acceptance.   Dental Advisory Given  Plan Discussed with: CRNA and Surgeon  Anesthesia Plan Comments:         Anesthesia Quick Evaluation

## 2013-01-12 NOTE — Anesthesia Procedure Notes (Signed)
Procedure Name: Intubation Date/Time: 01/12/2013 4:46 PM Performed by: Gwenyth Allegra Pre-anesthesia Checklist: Emergency Drugs available, Patient identified, Timeout performed, Suction available and Patient being monitored Patient Re-evaluated:Patient Re-evaluated prior to inductionOxygen Delivery Method: Circle system utilized Preoxygenation: Pre-oxygenation with 100% oxygen Intubation Type: IV induction Laryngoscope Size: Mac and 3 Grade View: Grade I Tube type: Oral Tube size: 7.5 mm Number of attempts: 1 Airway Equipment and Method: Stylet Secured at: 21 cm Tube secured with: Tape Dental Injury: Teeth and Oropharynx as per pre-operative assessment

## 2013-01-12 NOTE — Preoperative (Signed)
Beta Blockers   Reason not to administer Beta Blockers:Not Applicable 

## 2013-01-12 NOTE — Progress Notes (Signed)
Pt called and verified arrival time.  Instructed per OR schedule start time of 1445 to arrive at 1145 today.

## 2013-01-12 NOTE — Anesthesia Postprocedure Evaluation (Signed)
  Anesthesia Post-op Note  Patient: [name] [name]  Procedure(s) Performed: Procedure(s): TOTAL HIP ARTHROPLASTY WITH AUTOGRAFT (Right)  Patient Location: PACU  Anesthesia Type:General  Level of Consciousness: awake and alert   Airway and Oxygen Therapy: Patient Spontanous Breathing  Post-op Pain: mild  Post-op Assessment: Post-op Vital signs reviewed, Patient's Cardiovascular Status Stable, Respiratory Function Stable, Patent Airway, No signs of Nausea or vomiting and Pain level controlled  Post-op Vital Signs: stable  Complications: No apparent anesthesia complications

## 2013-01-12 NOTE — Interval H&P Note (Signed)
History and Physical Interval Note:  01/12/2013 8:28 AM  [name] [name]  has presented today for surgery, with the diagnosis of OA RIGHT HIP  The various methods of treatment have been discussed with the patient and family. After consideration of risks, benefits and other options for treatment, the patient has consented to  Procedure(s): TOTAL HIP ARTHROPLASTY WITH AUTOGRAFT (Right) as a surgical intervention .  The patient's history has been reviewed, patient examined, no change in status, stable for surgery.  I have reviewed the patient's chart and labs.  Questions were answered to the patient's satisfaction.     [name]

## 2013-01-12 NOTE — H&P (View-Only) (Signed)
TOTAL HIP ADMISSION H&P  Patient is admitted for right total hip arthroplasty.  Subjective:  Chief Complaint: right hip pain  HPI: [name] [name], 57 y.o. female, has a history of pain and functional disability in the right hip(s) due to arthritis and patient has failed non-surgical conservative treatments for greater than 12 weeks to include NSAID's and/or analgesics, flexibility and strengthening excercises, supervised PT with diminished ADL's post treatment, use of assistive devices and activity modification.  Onset of symptoms was gradual starting >10 years ago with gradually worsening course since that time.The patient noted no past surgery on the right hip(s).  Patient currently rates pain in the right hip at 10 out of 10 with activity. Patient has night pain, worsening of pain with activity and weight bearing, trendelenberg gait, pain that interfers with activities of daily living, pain with passive range of motion and crepitus. Patient has evidence of subchondral cysts, subchondral sclerosis and joint space narrowing by imaging studies. This condition presents safety issues increasing the risk of falls.   There is no current active infection.  Patient Active Problem List   Diagnosis Date Noted  . HYPOTHYROIDISM 11/07/2009  . CALLUSES, LEFT FOOT 11/07/2009  . FASCIITIS, PLANTAR 11/07/2009   Past Medical History  Diagnosis Date  . DDD (degenerative disc disease), lumbar   . Spine malformation   . Thyroid disease   . GERD (gastroesophageal reflux disease)   . Irregular heart rate   . Hypothyroidism   . Depression   . Pneumonia     hx  . Sleep apnea     unable to wear mask-sleep study 3-4 yrs ago    Past Surgical History  Procedure Laterality Date  . Abdominal hysterectomy    . Appendectomy    . Tonsillectomy    . Back surgery      l4 and l5  . Craniectomy suboccipital w/ cervical laminectomy / chiari  08  . Carpal tunnel release Bilateral     Current Outpatient  Prescriptions on File Prior to Visit  Medication Sig Dispense Refill  . aspirin 81 MG tablet Take 81 mg by mouth daily.        . CALCIUM PO Take 1 tablet by mouth daily.      . fenofibrate 160 MG tablet Take 160 mg by mouth daily.        . levothyroxine (SYNTHROID, LEVOTHROID) 125 MCG tablet Take 125 mcg by mouth daily before breakfast.      . Multiple Vitamins-Minerals (MULTIVITAMIN WITH MINERALS) tablet Take 1 tablet by mouth daily.        . nabumetone (RELAFEN) 500 MG tablet Take 500 mg by mouth daily.      . omeprazole (PRILOSEC) 20 MG capsule Take 20 mg by mouth daily.        . sertraline (ZOLOFT) 50 MG tablet Take 50 mg by mouth daily.       No current facility-administered medications on file prior to visit.    (Not in a hospital admission) Allergies  Allergen Reactions  . Morphine     REACTION: Bradycardia, Hypotension  . Diphenhydramine Hcl Palpitations    History  Substance Use Topics  . Smoking status: Never Smoker   . Smokeless tobacco: Not on file     Comment: 2004 last crack   occ alcohol  . Alcohol Use: Yes    History reviewed. No pertinent family history.   Review of Systems  Musculoskeletal: Positive for joint pain.  All other systems reviewed and are   negative.    Objective:  Physical Exam  Constitutional: She is oriented to person, place, and time. She appears well-developed and well-nourished.  HENT:  Head: Normocephalic and atraumatic.  Eyes: EOM are normal. Pupils are equal, round, and reactive to light.  Neck: Neck supple.  Cardiovascular: Normal rate, regular rhythm, normal heart sounds and intact distal pulses.   Respiratory: Effort normal and breath sounds normal.  GI: Soft. Bowel sounds are normal.  Genitourinary:  NA  Musculoskeletal:  Right hip Trendelenburg component on the right.  Virtually no motion of the right hip because of pain and arthritis.  Left hip has good motion. Negative log roll.  For the most part she is neurovascularly  intact distally with negative straight leg raising.  There is some decreased lumbar motion, however is not extreme.  A lot of deconditioning coupled with her exogenous obesity.    Neurological: She is alert and oriented to person, place, and time.  Skin: Skin is warm.  Psychiatric: She has a normal mood and affect. Her behavior is normal.    Vital signs in last 24 hours: BP-119/75, Pulse-67, Temp- 97.5 Labs:   Estimated body mass index is 35.93 kg/(m^2) as calculated from the following:   Height as of 01/04/13: 5' 6" (1.676 m).   Weight as of 01/04/13: 100.925 kg (222 lb 8 oz).   Imaging Review Plain radiographs demonstrate severe degenerative joint disease of the right hip(s). The bone quality appears to be good for age and reported activity level.  Assessment/Plan:  End stage arthritis, right hip(s)  The patient history, physical examination, clinical judgement of the provider and imaging studies are consistent with end stage degenerative joint disease of the right hip(s) and total hip arthroplasty is deemed medically necessary. The treatment options including medical management, injection therapy, arthroscopy and arthroplasty were discussed at length. The risks and benefits of total hip arthroplasty were presented and reviewed. The risks due to aseptic loosening, infection, stiffness, dislocation/subluxation,  thromboembolic complications and other imponderables were discussed.  The patient acknowledged the explanation, agreed to proceed with the plan and consent was signed. Patient is being admitted for inpatient treatment for surgery, pain control, PT, OT, prophylactic antibiotics, VTE prophylaxis, progressive ambulation and ADL's and discharge planning.The patient is planning to be discharged to skilled nursing facility 

## 2013-01-13 ENCOUNTER — Encounter (HOSPITAL_COMMUNITY): Payer: Self-pay | Admitting: Orthopedic Surgery

## 2013-01-13 LAB — BASIC METABOLIC PANEL
BUN: 16 mg/dL (ref 6–23)
GFR calc Af Amer: 47 mL/min — ABNORMAL LOW (ref >90)
GFR calc non Af Amer: 40 mL/min — ABNORMAL LOW (ref >90)
Potassium: 5.2 mEq/L — ABNORMAL HIGH (ref 3.5–5.1)

## 2013-01-13 LAB — URINE CULTURE: Culture: NO GROWTH

## 2013-01-13 LAB — CBC
HCT: 34.9 % — ABNORMAL LOW (ref 36.0–46.0)
MCHC: 33 g/dL (ref 30.0–36.0)
Platelets: 300 10*3/uL (ref 150–400)
RDW: 12.9 % (ref 11.5–15.5)

## 2013-01-13 NOTE — Progress Notes (Signed)
Patient unable to void 6 hours after foley catheter removal.  Bladder scan performed with result of and intermittent straight catheter done with 600 mL of output.

## 2013-01-13 NOTE — Clinical Social Work Placement (Addendum)
Clinical Social Work Department CLINICAL SOCIAL WORK PLACEMENT NOTE 01/13/2013  Patient:  [name] [name],[name] [name]  Account Number:  [id number] Admit date:  01/12/2013  Clinical Social Worker:  Macario Golds, LCSW  Date/time:  01/13/2013 03:00 PM  Clinical Social Work is seeking post-discharge placement for this patient at the following level of care:   SKILLED NURSING   (*CSW will update this form in Epic as items are completed)   01/13/2013  Patient/family provided with [facility] Department of Clinical Social Work's list of facilities offering this level of care within the geographic area requested by the patient (or if unable, by the patient's family).  01/13/2013  Patient/family informed of their freedom to choose among providers that offer the needed level of care, that participate in Medicare, Medicaid or managed care program needed by the patient, have an available bed and are willing to accept the patient.  01/13/2013  Patient/family informed of [facility]' ownership interest in [facility], as well as of the fact that they are under no obligation to receive care at this facility.  PASARR submitted to EDS on 01/13/2013 PASARR number received from EDS on   FL2 transmitted to all facilities in geographic area requested by pt/family on  01/13/2013 FL2 transmitted to all facilities within larger geographic area on   Patient informed that his/her managed care company has contracts with or will negotiate with  certain facilities, including the following:     Patient/family informed of bed offers received:  01/14/13 Patient chooses bed at  [facility] Physician recommends and patient chooses bed at    Patient to be transferred to [facility] on   Patient to be transferred to facility by   The following physician request were entered in Epic:   Additional Comments: 09/04- Patient with prior arrangements to [facility].

## 2013-01-13 NOTE — Progress Notes (Signed)
Patient ID: [name] [name], female   DOB: 02/01/1956, 58 y.o.   MRN: [id number]  PROGRESS NOTE  Subjective:  negative for Chest Pain  negative for Shortness of Breath  negative for Nausea/Vomiting   negative for Calf Pain  negative for Bowel Movement   Tolerating Diet: yes         Patient reports pain as 3 on 0-10 scale.     Pt c/o a cough and sore throat from her ET tube. She denies any difficulty breathing or swallowing. She states she had some difficulty with sleep due to the cough and sore throat. She states her pain is well controlled on current medications.   Objective: Vital signs in last 24 hours:   Patient Vitals for the past 24 hrs:  BP Temp Temp src Pulse Resp SpO2  01/13/13 0624 98/57 mmHg 98.9 F (37.2 C) Oral 99 20 96 %  01/13/13 0229 124/68 mmHg 98.1 F (36.7 C) Oral 99 19 94 %  01/12/13 2012 116/63 mmHg 98 F (36.7 C) Oral 95 18 93 %  01/12/13 1945 113/65 mmHg 98.7 F (37.1 C) - 87 18 93 %  01/12/13 1930 100/61 mmHg - - 83 15 95 %  01/12/13 1915 110/63 mmHg - - 89 12 97 %  01/12/13 1909 112/67 mmHg - - 80 16 92 %  01/12/13 1900 - - - 88 11 94 %  01/12/13 1854 110/59 mmHg - - 78 11 97 %  01/12/13 1845 - - - 82 11 95 %  01/12/13 1840 118/59 mmHg - - 89 15 95 %  01/12/13 1830 - - - 87 17 94 %  01/12/13 1824 135/54 mmHg - - 89 18 92 %  01/12/13 1822 - 98.1 F (36.7 C) - - 20 -  01/12/13 1151 141/73 mmHg 96.4 F (35.8 C) Oral 76 20 97 %      Intake/Output from previous day:   09/03 0701 - 09/04 0700 In: 1923.3 [P.O.:600; I.V.:1223.3] Out: 625 [Urine:375]   Intake/Output this shift:       Intake/Output     09/03 0701 - 09/04 0700 09/04 0701 - 09/05 0700   P.O. 600    I.V. 1223.3    IV Piggyback 100    Total Intake 1923.3     Urine 375    Blood 250    Total Output 625     Net +1298.3             LABORATORY DATA:  Recent Labs  01/12/13 1129 01/13/13 0500  WBC 10.1 11.8*  HGB 13.8 11.5*  HCT 40.0 34.9*  PLT 330 300    Recent Labs  01/12/13 1129 01/13/13 0500  NA 138 142  K 4.1 5.2*  CL 104 107  CO2 21 26  BUN 14 16  CREATININE 1.03 1.42*  GLUCOSE 87 124*  CALCIUM 9.6 8.5   Lab Results  Component Value Date   INR 1.01 01/12/2013    Examination:  General appearance: alert, cooperative, fatigued and no distress  Wound Exam: dressing in place  Drainage: no drainage noted through dressing  Motor Exam: grossly intact bilateral LE  Sensory Exam: grossly intact bilateral LE  Vascular Exam: Normal  Assessment:    1 Day Post-Op  Procedure(s) (LRB): TOTAL HIP ARTHROPLASTY WITH AUTOGRAFT (Right)  ADDITIONAL DIAGNOSIS:  Active Problems:   * No active hospital problems. *     Plan: Physical Therapy as ordered Weight Bearing as Tolerated (WBAT)  DVT Prophylaxis:  Aspirin, Foot  Pumps and TED hose  DISCHARGE PLAN: Skilled Nursing Facility/Rehab  DISCHARGE NEEDS: CPM, Walker and 3-in-1 comode seat         [name] 01/13/2013, 7:06 AM

## 2013-01-13 NOTE — Progress Notes (Signed)
01/13/13 Patient set up with HHPT and HHOT with Gordy Clement by MD office. PT recommended SNF. Referral made to CSW. Will continue to follow for discharge needs. Jacquelynn Cree RN, BSN, CCM

## 2013-01-13 NOTE — Evaluation (Signed)
Physical Therapy Evaluation Patient Details Name: [name] [name] MRN: [id number] DOB: 25-May-1955 Today's Date: 01/13/2013 Time: 4696-2952 PT Time Calculation (min): 22 min  PT Assessment / Plan / Recommendation History of Present Illness  s/p elective Rt THA   Clinical Impression  Pt is s/p Rt THA POD#1 resulting in the deficits listed below (see PT Problem List). Pt became dizzy during amb; limiting amb distance this session. Pt will benefit from skilled PT to increase their independence and safety with mobility to allow discharge to the venue listed below.      PT Assessment  Patient needs continued PT services    Follow Up Recommendations  SNF;Supervision/Assistance - 24 hour    Does the patient have the potential to tolerate intense rehabilitation      Barriers to Discharge Decreased caregiver support pt lives with 'boyfriend" who works 8-10hours daily; plans to D/C to SNF    Equipment Recommendations  None recommended by PT (pt reports she has 3 in 1 and RW)    Recommendations for Other Services     Frequency 7X/week    Precautions / Restrictions Precautions Precautions: Fall;Posterior Hip Precaution Booklet Issued: Yes (comment) Precaution Comments: reviewed hip precautions handout with pt; cues during session to maintain  (pt reports she fell 4 times prior to surgery due to pain) Restrictions Weight Bearing Restrictions: Yes RLE Weight Bearing: Weight bearing as tolerated   Pertinent Vitals/Pain 4/10; RN provided medication to assist with pain control       Mobility  Bed Mobility Bed Mobility: Supine to Sit;Sitting - Scoot to Edge of Bed Supine to Sit: 4: Min assist;HOB elevated;With rails Sitting - Scoot to Edge of Bed: 4: Min assist;With rail Details for Bed Mobility Assistance: (A) to advance Rt LE to/off EOB; incr time due to pain; cues for hand placement and technique Transfers Transfers: Sit to Stand;Stand to Sit Sit to Stand: 4: Min guard;From bed;From  elevated surface;With upper extremity assist Stand to Sit: 4: Min guard;To chair/3-in-1;With armrests;With upper extremity assist Details for Transfer Assistance: min guard to maintain balance and stead; cues for hand placement and technique to maintain hip precautions Ambulation/Gait Ambulation/Gait Assistance: 4: Min assist Ambulation Distance (Feet): 6 Feet Assistive device: Rolling walker Ambulation/Gait Assistance Details: mod cues for gt sequencing; (A) to manage RW; pt limited; became dizzy and returned to recliner  Gait Pattern: Step-to pattern;Decreased stance time - right;Decreased step length - left Gait velocity: decr due to pain Stairs: No Wheelchair Mobility Wheelchair Mobility: No    Exercises Total Joint Exercises Ankle Circles/Pumps: AROM;Both;10 reps Gluteal Sets: 10 reps;Seated Hip ABduction/ADduction: AAROM;Right;10 reps;Seated;Other (comment) (lilmited by pain) Long Arc Quad: AROM;Right;10 reps;Seated   PT Diagnosis: Difficulty walking;Acute pain  PT Problem List: Decreased strength;Decreased range of motion;Decreased activity tolerance;Decreased balance;Decreased mobility;Decreased knowledge of use of DME;Decreased safety awareness;Decreased knowledge of precautions;Pain PT Treatment Interventions: DME instruction;Gait training;Functional mobility training;Therapeutic activities;Therapeutic exercise;Balance training;Neuromuscular re-education;Patient/family education     PT Goals(Current goals can be found in the care plan section) Acute Rehab PT Goals Patient Stated Goal: to walk more PT Goal Formulation: With patient Time For Goal Achievement: 01/20/13 Potential to Achieve Goals: Good  Visit Information  Last PT Received On: 01/13/13 Assistance Needed: +2 (to follow with chair for safety) History of Present Illness: s/p elective Rt THA        Prior Functioning  Home Living Family/patient expects to be discharged to:: Skilled nursing facility Living  Arrangements: Spouse/significant other Additional Comments: Plans to D/C to [facility]  Prior  Function Level of Independence: Independent with assistive device(s) Comments: uses SPC to amb Communication Communication: No difficulties Dominant Hand: Right    Cognition  Cognition Arousal/Alertness: Awake/alert Behavior During Therapy: WFL for tasks assessed/performed Overall Cognitive Status: Within Functional Limits for tasks assessed    Extremity/Trunk Assessment Upper Extremity Assessment Upper Extremity Assessment: Defer to OT evaluation Lower Extremity Assessment Lower Extremity Assessment: RLE deficits/detail RLE Deficits / Details: Rt knee grossly 3/5; limited by pain RLE: Unable to fully assess due to pain Cervical / Trunk Assessment Cervical / Trunk Assessment: Kyphotic   Balance Balance Balance Assessed: Yes Static Sitting Balance Static Sitting - Balance Support: No upper extremity supported;Feet supported Static Sitting - Level of Assistance: 5: Stand by assistance  End of Session PT - End of Session Equipment Utilized During Treatment: Gait belt Activity Tolerance: Other (comment) (pt became dizzy) Patient left: in chair;with call bell/phone within reach Nurse Communication: Mobility status  GP     Donell Sievert, [place] [phone] 01/13/2013, 11:34 AM

## 2013-01-13 NOTE — Clinical Social Work Note (Signed)
Clinical Social Work Department BRIEF PSYCHOSOCIAL ASSESSMENT 01/13/2013  Patient:  [name] [name]     Account Number:  [id number]     Admit date:  01/12/2013  Clinical Social Worker:  [name] [name]  Date/Time:  01/13/2013 02:30 PM  Referred by:  Care Management  Date Referred:  01/13/2013 Referred for  SNF Placement   Other Referral:   Interview type:  Patient Other interview type:   No family at bedside.    PSYCHOSOCIAL DATA Living Status:  SIGNIFICANT OTHER Admitted from facility:   Level of care:   Primary support name:  [name]  [phone] Primary support relationship to patient:  PARTNER Degree of support available:   Fair    CURRENT CONCERNS Current Concerns  Post-Acute Placement   Other Concerns:    SOCIAL WORK ASSESSMENT / PLAN Clinical Social Worker spoke with patient at bedside to offer support and discuss patient needs at discharge. Patient states that she is currently living with her husband. Patient has made arrangements prior to hospitalization for placement at [facility]. CSW will complete FL-2 and initiate referral to [facility]. CSW to contact facility regarding patient discharge plans. CSW remains available for support and to facilitate discharge needs once patient medically ready.   Assessment/plan status:  Psychosocial Support/Ongoing Assessment of Needs Other assessment/ plan:   Information/referral to community resources:   CSW offered patient facility list, however due to patient prior arrangements no list needed at this time.    PATIENT'S/FAMILY'S RESPONSE TO PLAN OF CARE: Patient alert and oriented X4 laying in the bed. Patient with family support at home. Patient agreeable to PT recommendations for SNF placement at discharge. Patient verbalized appreciation for CSW support and involvement.

## 2013-01-13 NOTE — Progress Notes (Signed)
UR COMPLETED  

## 2013-01-13 NOTE — Op Note (Signed)
NAME:  [name] [name], [name] [name] NO.:  [id number]  MEDICAL RECORD NO.:  [id number]  LOCATION:  [id number]                        FACILITY:  [facility]  PHYSICIAN:  Loreta Ave, M.D. DATE OF BIRTH:  05/30/1955  DATE OF PROCEDURE:  01/12/2013 DATE OF DISCHARGE:                              OPERATIVE REPORT   PREOPERATIVE DIAGNOSIS:  End-stage degenerative arthritis, right hip.  POSTOPERATIVE DIAGNOSIS:  End-stage degenerative arthritis, right hip.  PROCEDURE:  Right total hip replacement utilizing Stryker prosthesis.  A press-fit 50-mm cup.  Screw fixation x2.  A 36-mm internal diameter liner with a 10-degree overhang.  A press-fit #8 femoral stem.  A 127- degree neck angle.  A 36+ 0 head, BioLock head.  SURGEON:  Loreta Ave, MD  ASSISTANT:  Danford Bad. [place], [place], present throughout the entire case and necessary for timely completion of procedure.  ANESTHESIA:  General.  ESTIMATED BLOOD LOSS:  150 mL.  BLOOD GIVEN:  None.  SPECIMENS:  None.  CULTURES:  None.  COMPLICATIONS:  None.  DRESSING:  Soft compressive.  DESCRIPTION OF PROCEDURE:  The patient was brought to the operating room, placed on the operating table in supine position.  After adequate anesthesia had been obtained, turned to a lateral position.  Appropriate padding and support.  Everything made more difficult because of morbid obesity.  After being prepped and draped, incision along the shaft of femur extending posterosuperior.  Skin and subcutaneous tissue divided. Numerous generous fat layers divided down to the iliotibial band.  That was exposed and incised.  Charnley retractor was put in place. Neurovascular structures protected.  External rotator capsule taken down off the back of the intertrochanteric groove of the femur.  Tagged with FiberWire.  Hip dislocated.  Femoral neck cut 1 fingerbreadth above the lesser trochanter.  Femoral head removed.  Grade 4 changes.  Acetabulum exposed.   Clot brought up to good bleeding bone.  Sufficient medial and inferior positioning.  Sized and fitted with a 55 0 cup, placed at 45 degrees of abduction, 20 degrees of anteversion.  Good capturing and fixation augmented with 2 screws placed through this cup.  A 36-mm internal diameter liner with a 10-degree overhang posterosuperior. Attention was turned to the femur.  Reamers, broaches bringing this up to good sizing and fitting proximally and distally.  Size #8 component, restoring normal anteversion of femur.  Definitive component was seated. After appropriate trials, the 36 +0 BioLock head was attached.  Hip reduced.  Excellent stability in flexion and extension.  Equal leg lengths.  Wound irrigated.  External rotator and capsule were repaired to the back of the intertrochanteric groove through drill holes. FiberWire tied over bony bridge.  Charnley retractor removed after irrigation.  Iliotibial band closed with #1 Vicryl.  Skin and subcutaneous tissue with Vicryl and subcuticular closure.  Margins were injected with Marcaine.  Sterile compressive dressing applied.  Returned to supine position.  Abduction pillow placed.  Anesthesia reversed. Brought to the recovery room.  Tolerated the surgery well with no complications.     Loreta Ave, M.D.     DFM/MEDQ  D:  01/13/2013  T:  01/13/2013  Job:  [id number]

## 2013-01-14 LAB — BASIC METABOLIC PANEL
GFR calc Af Amer: 70 mL/min — ABNORMAL LOW (ref >90)
GFR calc non Af Amer: 60 mL/min — ABNORMAL LOW (ref >90)
Glucose, Bld: 116 mg/dL — ABNORMAL HIGH (ref 70–99)
Potassium: 4.2 mEq/L (ref 3.5–5.1)
Sodium: 138 mEq/L (ref 135–145)

## 2013-01-14 LAB — CBC
Hemoglobin: 9.5 g/dL — ABNORMAL LOW (ref 12.0–15.0)
MCHC: 33.2 g/dL (ref 30.0–36.0)
RDW: 12.5 % (ref 11.5–15.5)

## 2013-01-14 MED ORDER — ASPIRIN 325 MG PO TABS: 325.0000 mg | ORAL_TABLET | Freq: Every day | ORAL | Status: DC

## 2013-01-14 MED ORDER — BACLOFEN 10 MG PO TABS: 10.0000 mg | ORAL_TABLET | Freq: Three times a day (TID) | ORAL | Status: DC

## 2013-01-14 MED ORDER — OXYCODONE-ACETAMINOPHEN 7.5-325 MG PO TABS: 1.0000 | ORAL_TABLET | ORAL | Status: DC | PRN

## 2013-01-14 NOTE — Progress Notes (Signed)
Physical Therapy Treatment Patient Details Name: [name] [name] MRN: [id number] DOB: 01/27/1956 Today's Date: 01/14/2013 Time: 4098-1191 PT Time Calculation (min): 23 min  PT Assessment / Plan / Recommendation  History of Present Illness s/p elective Rt THA    PT Comments   Pt slowly progressing with mobility. Continues to demo fatigue quickly with activity. Requires cues to adhere to hip precautions with mobility. Pt highly motivated to return home after STSNF and be independent. Cont POC; pt hopes to D/C to [facility]  tomorrow.   Follow Up Recommendations  SNF;Supervision/Assistance - 24 hour     Does the patient have the potential to tolerate intense rehabilitation     Barriers to Discharge        Equipment Recommendations  None recommended by PT    Recommendations for Other Services    Frequency 7X/week   Progress towards PT Goals Progress towards PT goals: Progressing toward goals  Plan Current plan remains appropriate    Precautions / Restrictions Precautions Precautions: Fall;Posterior Hip Precaution Comments: reviewed hip precautions handout; pt able to recall 3/3  Required Braces or Orthoses: Other Brace/Splint Other Brace/Splint: hip abduction pillow Restrictions Weight Bearing Restrictions: Yes RLE Weight Bearing: Weight bearing as tolerated   Pertinent Vitals/Pain 6/10; RN provided medication to assist with pain control    Mobility  Bed Mobility Bed Mobility: Supine to Sit;Sitting - Scoot to Edge of Bed Supine to Sit: 4: Min assist;HOB elevated;With rails Sitting - Scoot to Edge of Bed: 4: Min assist;With rail Details for Bed Mobility Assistance: (A) to advance Rt LE to/off EOB; cues for sequencing and technique; pt relies on handrails Transfers Transfers: Sit to Stand;Stand to Sit Sit to Stand: 4: Min guard;From bed;From elevated surface;With upper extremity assist Stand to Sit: 4: Min guard;To chair/3-in-1;With armrests;With upper extremity assist Details  for Transfer Assistance: min guard to balance; cues for hand placement and technique  Ambulation/Gait Ambulation/Gait Assistance: 4: Min guard Ambulation Distance (Feet): 30 Feet Assistive device: Rolling walker Ambulation/Gait Assistance Details: cues to maintain hip preacutions with turns; multimodal cues for gt sequencing; pt cont to amb with step to gt due to pain; relies heavily on UEs Gait Pattern: Step-to pattern;Decreased stance time - right;Decreased step length - left Gait velocity: decr due to pain Stairs: No Wheelchair Mobility Wheelchair Mobility: No    Exercises Total Joint Exercises Ankle Circles/Pumps: AROM;10 reps;Seated;Both Gluteal Sets: 10 reps;Seated Hip ABduction/ADduction: AAROM;Right;10 reps;Seated Long Arc Quad: AROM;Right;10 reps;Seated Knee Flexion: Right;10 reps;Standing;AROM   PT Diagnosis:    PT Problem List:   PT Treatment Interventions:     PT Goals (current goals can now be found in the care plan section) Acute Rehab PT Goals Patient Stated Goal: [facility] tomorrow  PT Goal Formulation: With patient Time For Goal Achievement: 01/20/13 Potential to Achieve Goals: Good  Visit Information  Last PT Received On: 01/14/13 Assistance Needed: +1 History of Present Illness: s/p elective Rt THA     Subjective Data  Subjective: Pt lying supine; stated "i was just calling for someone. im about to wet the bed" Patient Stated Goal: [facility] tomorrow    Cognition  Cognition Arousal/Alertness: Awake/alert Behavior During Therapy: WFL for tasks assessed/performed Overall Cognitive Status: Within Functional Limits for tasks assessed    Balance  Balance Balance Assessed: No  End of Session PT - End of Session Equipment Utilized During Treatment: Gait belt Activity Tolerance: Patient tolerated treatment well Patient left: in chair;with call bell/phone within reach Nurse Communication: Mobility status  GP     Shelva Majestic [place], [place] [phone] 01/14/2013,  12:50 PM

## 2013-01-14 NOTE — Progress Notes (Signed)
Patient ID: [name] [name], female   DOB: 1956/01/26, 57 y.o.   MRN: [id number]  PROGRESS NOTE  Subjective:  negative for Chest Pain  negative for Shortness of Breath  negative for Nausea/Vomiting   negative for Calf Pain  negative for Bowel Movement   Tolerating Diet: yes         Patient reports pain as 5 on 0-10 scale.     Pt doing well. Making good progress with PT. Pain well controlled on current meds.   Objective: Vital signs in last 24 hours:   Patient Vitals for the past 24 hrs:  BP Temp Pulse Resp SpO2  01/14/13 0508 112/55 mmHg 97.6 F (36.4 C) 99 16 94 %  01/14/13 0400 - - - 18 95 %  01/14/13 0000 - - - 18 94 %  01/13/13 2013 106/53 mmHg 98.1 F (36.7 C) 102 16 96 %  01/13/13 2000 - - - 17 95 %  01/13/13 1234 94/57 mmHg 99.7 F (37.6 C) 101 16 94 %      Intake/Output from previous day:   09/04 0701 - 09/05 0700 In: 1080 [P.O.:480; I.V.:600] Out: 1000 [Urine:1000]   Intake/Output this shift:       Intake/Output     09/04 0701 - 09/05 0700 09/05 0701 - 09/06 0700   P.O. 480    I.V. 600    IV Piggyback     Total Intake 1080     Urine 1000    Blood     Total Output 1000     Net +80          Urine Occurrence 3 x       LABORATORY DATA:  Recent Labs  01/12/13 1129 01/13/13 0500 01/14/13 0625  WBC 10.1 11.8* 11.6*  HGB 13.8 11.5* 9.5*  HCT 40.0 34.9* 28.6*  PLT 330 300 253    Recent Labs  01/12/13 1129 01/13/13 0500  NA 138 142  K 4.1 5.2*  CL 104 107  CO2 21 26  BUN 14 16  CREATININE 1.03 1.42*  GLUCOSE 87 124*  CALCIUM 9.6 8.5   Lab Results  Component Value Date   INR 1.01 01/12/2013    Examination:  General appearance: alert, cooperative and no distress  Wound Exam: clean, dry, intact   Drainage:  None: wound tissue dry  Motor Exam: grossly intact bilateral LE  Sensory Exam: grossly intact bilateral LE  Vascular Exam: Normal  Assessment:    2 Days Post-Op  Procedure(s) (LRB): TOTAL HIP ARTHROPLASTY WITH AUTOGRAFT  (Right)  ADDITIONAL DIAGNOSIS:  Active Problems:   * No active hospital problems. *     Plan: Physical Therapy as ordered Weight Bearing as Tolerated (WBAT)  DVT Prophylaxis:  Aspirin, Foot Pumps and TED hose  DISCHARGE PLAN: Skilled Nursing Facility/Rehab tomorrow  DISCHARGE NEEDS: HHPT, Walker and 3-in-1 comode seat         Wilkie Aye 01/14/2013, 7:46 AM

## 2013-01-14 NOTE — Discharge Summary (Signed)
PATIENT ID: [name] [name]        MRN:  [id number]          DOB/AGE: [birth year] / 57 y.o.    DISCHARGE SUMMARY  ADMISSION DATE:    01/12/2013 DISCHARGE DATE:   01/14/2013   ADMISSION DIAGNOSIS: OA RIGHT HIP    DISCHARGE DIAGNOSIS:  OA RIGHT HIP    ADDITIONAL DIAGNOSIS: Active Problems:   * No active hospital problems. *  Past Medical History  Diagnosis Date  . DDD (degenerative disc disease), lumbar   . Spine malformation   . Thyroid disease   . GERD (gastroesophageal reflux disease)   . Irregular heart rate   . Hypothyroidism   . Depression   . Pneumonia     hx  . Sleep apnea     unable to wear mask-sleep study 3-4 yrs ago    PROCEDURE: Procedure(s): TOTAL HIP ARTHROPLASTY WITH AUTOGRAFT Right on 01/12/2013  CONSULTS: Pt/OT, Care Mgmt, Soc serv  HISTORY: See H&P in chart  HOSPITAL COURSE:  [name] [name] is a 57 y.o. admitted on 01/12/2013 and found to have a diagnosis of OA RIGHT HIP.  After appropriate laboratory studies were obtained  they were taken to the operating room on 01/12/2013 and underwent  Procedure(s): TOTAL HIP ARTHROPLASTY WITH AUTOGRAFT  Right.   They were given perioperative antibiotics:  Anti-infectives   Start     Dose/Rate Route Frequency Ordered Stop   01/12/13 2200  ceFAZolin (ANCEF) IVPB 2 g/50 mL premix     2 g 100 mL/hr over 30 Minutes Intravenous Every 6 hours 01/12/13 2016 01/13/13 0421   01/12/13 0600  ceFAZolin (ANCEF) IVPB 2 g/50 mL premix     2 g 100 mL/hr over 30 Minutes Intravenous On call to O.R. 01/11/13 1428 01/12/13 1630    .  Tolerated the procedure well.  Placed with a foley intraoperatively.    POD #1, allowed out of bed to a chair.  PT for ambulation and exercise program.  Foley D/C'd in morning.  IV saline locked.  O2 discontionued.  POD #2, continued PT and ambulation.  Hemovac pulled. .  The remainder of the hospital course was dedicated to ambulation and strengthening.   The patient was discharged on 2 Days Post-Op in   Stable condition.  Blood products given:none  DIAGNOSTIC STUDIES: Recent vital signs: Patient Vitals for the past 24 hrs:  BP Temp Pulse Resp SpO2  01/14/13 1415 101/52 mmHg 98.4 F (36.9 C) 88 18 92 %  01/14/13 1200 - - - 18 -  01/14/13 0800 - - - 16 -  01/14/13 0508 112/55 mmHg 97.6 F (36.4 C) 99 16 94 %  01/14/13 0400 - - - 18 95 %  01/14/13 0000 - - - 18 94 %  01/13/13 2013 106/53 mmHg 98.1 F (36.7 C) 102 16 96 %  01/13/13 2000 - - - 17 95 %       Recent laboratory studies:  Recent Labs  01/12/13 1129 01/13/13 0500 01/14/13 0625  WBC 10.1 11.8* 11.6*  HGB 13.8 11.5* 9.5*  HCT 40.0 34.9* 28.6*  PLT 330 300 253    Recent Labs  01/12/13 1129 01/13/13 0500 01/14/13 0625  NA 138 142 138  K 4.1 5.2* 4.2  CL 104 107 104  CO2 21 26 24   BUN 14 16 11   CREATININE 1.03 1.42* 1.02  GLUCOSE 87 124* 116*  CALCIUM 9.6 8.5 8.8   Lab Results  Component Value Date  INR 1.01 01/12/2013     Recent Radiographic Studies :  Dg Chest 2 View  01/12/2013   *RADIOLOGY REPORT*  Clinical Data: Preop right hip replacement.  No chest complaints.  CHEST - 2 VIEW  Comparison: 01/17/2011.  Findings: No infiltrate, congestive heart failure or pneumothorax. Minimal scarring left lung base unchanged.  Mild cardiomegaly.  IMPRESSION: Mild cardiomegaly.   Original Report Authenticated By: [name] [name], M.D.   Dg Pelvis Portable  01/12/2013   CLINICAL DATA:  57 year old female status post surgery.  EXAM: PORTABLE PELVIS  COMPARISON:  Hip CT 11/05/2012.  FINDINGS: AP portable spine view of the pelvis at 1858 hrs. Sequelae of right total hip arthroplasty. Overlying postoperative changes to the soft tissues. Hardware components appear intact and normally aligned on this AP view. Otherwise stable pelvis. Partially visible lower lumbar spine fusion hardware.  IMPRESSION: Right total hip arthroplasty, no adverse features.   Electronically Signed   By: [name] [name] M.D.   On: 01/12/2013 19:42    DISCHARGE  INSTRUCTIONS: Discharge Orders   Future Orders Complete By Expires   Call MD / Call 911  As directed    Comments:     If you experience chest pain or shortness of breath, CALL 911 and be transported to the hospital emergency room.  If you develope a fever above 101 F, pus (white drainage) or increased drainage or redness at the wound, or calf pain, call your surgeon's office.   Change dressing  As directed    Comments:     change the dressing daily with sterile 4 x 4 inch gauze dressing and paper tape.  You may clean the incision with alcohol prior to redressing   Constipation Prevention  As directed    Comments:     Drink plenty of fluids.  Prune juice may be helpful.  You may use a stool softener, such as Colace (over the counter) 100 mg twice a day.  Use MiraLax (over the counter) for constipation as needed.   Diet - low sodium heart healthy  As directed    Follow the hip precautions as taught in Physical Therapy  As directed    Increase activity slowly as tolerated  As directed    Patient may shower  As directed    Comments:     You may shower without a dressing once there is no drainage x 2 days. If drainage remains, cover wound with plastic wrap and then shower.   TED hose  As directed    Comments:     Use stockings (TED hose) for 2 weeks on both leg(s).  You may remove them at night for sleeping.   Weight bearing as tolerated  As directed       DISCHARGE MEDICATIONS:     Medication List         aspirin 325 MG tablet  Take 1 tablet (325 mg total) by mouth daily.     baclofen 10 MG tablet  Commonly known as:  LIORESAL  Take 1 tablet (10 mg total) by mouth 3 (three) times daily.     CALCIUM PO  Take 1 tablet by mouth daily.     fenofibrate 160 MG tablet  Take 160 mg by mouth daily.     levothyroxine 125 MCG tablet  Commonly known as:  SYNTHROID, LEVOTHROID  Take 125 mcg by mouth daily before breakfast.     multivitamin with minerals tablet  Take 1 tablet by mouth  daily.  nabumetone 500 MG tablet  Commonly known as:  RELAFEN  Take 500 mg by mouth daily.     omeprazole 20 MG capsule  Commonly known as:  PRILOSEC  Take 20 mg by mouth daily.     oxyCODONE-acetaminophen 7.5-325 MG per tablet  Commonly known as:  PERCOCET  Take 1 tablet by mouth every 4 (four) hours as needed for pain.     sertraline 50 MG tablet  Commonly known as:  ZOLOFT  Take 50 mg by mouth daily.        FOLLOW UP VISIT:       Follow-up Information   Follow up with Loreta Ave, MD. (as scheduled 2 weeks post-op)    Specialty:  Orthopedic Surgery   Contact information:   [address] [place] [address] [phone]       DISPOSITION:   Skilled Nursing Facility/Rehab tomorrow 01/15/13  CONDITION:  Stable   Sarely Stracener JAMES 01/14/2013, 3:20 PM

## 2013-01-14 NOTE — Clinical Social Work Note (Addendum)
Clinical Social Worker continuing to follow patient for support and discharge planning needs.  CSW has faxed discharge paperwork to [facility] and made arrangements for discharge tomorrow.  CSW will continue to follow and facilitate patient discharge needs Saturday 01/15/2013.  Macario Golds, [place] [phone]

## 2013-01-14 NOTE — Evaluation (Signed)
Occupational Therapy Evaluation and Discharge Patient Details Name: [name] [name] MRN: [id number] DOB: 10/30/1955 Today's Date: 01/14/2013 Time: 4098-1191 OT Time Calculation (min): 16 min  OT Assessment / Plan / Recommendation History of present illness s/p elective Rt THA    Clinical Impression   Pt s/p elective Rt THA.  Pt demonstrates decreased activity tolerance and decreased independence with ADLs. Per pt and LCSW note, pt to d/c to SNF ([facility]).  All further OT needs can be met at next venue in order to progress rehab for eventual return home. Acute OT to sign off.    OT Assessment  All further OT needs can be met in the next venue of care    Follow Up Recommendations  SNF    Barriers to Discharge      Equipment Recommendations  3 in 1 bedside comode    Recommendations for Other Services    Frequency       Precautions / Restrictions Precautions Precautions: Fall;Posterior Hip Precaution Comments: Reviewed 3/3 posterior hip precautions.  Required Braces or Orthoses: Other Brace/Splint Other Brace/Splint: hip abduction pillow Restrictions Weight Bearing Restrictions: Yes RLE Weight Bearing: Weight bearing as tolerated   Pertinent Vitals/Pain See vitals    ADL  Grooming: Performed;Wash/dry hands;Wash/dry face;Teeth care;Supervision/safety Where Assessed - Grooming: Unsupported standing Upper Body Bathing: Simulated;Set up Where Assessed - Upper Body Bathing: Unsupported sitting Lower Body Bathing: Simulated;Moderate assistance (without AE) Where Assessed - Lower Body Bathing: Unsupported sit to stand Upper Body Dressing: Simulated;Set up Where Assessed - Upper Body Dressing: Unsupported sitting Lower Body Dressing: Simulated;Maximal assistance (without AE) Where Assessed - Lower Body Dressing: Unsupported sit to stand Toilet Transfer: Simulated;Min guard Toilet Transfer Method: Sit to stand (bed<>ambulate to bathroom<>return to bed) Equipment  Used: Gait belt;Rolling walker Transfers/Ambulation Related to ADLs: min guard with RW ADL Comments: Min verbal cueing to maintain hip precautions when turning in bathroom with RW.     OT Diagnosis:    OT Problem List:   OT Treatment Interventions:     OT Goals(Current goals can be found in the care plan section) Acute Rehab OT Goals Patient Stated Goal: [facility] tomorrow   Visit Information  Last OT Received On: 01/14/13 Assistance Needed: +1 History of Present Illness: s/p elective Rt THA        Prior Functioning     Home Living Family/patient expects to be discharged to:: Skilled nursing facility Additional Comments: Plans to D/C to [facility]  Prior Function Level of Independence: Independent with assistive device(s) Communication Communication: No difficulties Dominant Hand: Right         Vision/Perception     Cognition  Cognition Arousal/Alertness: Awake/alert Behavior During Therapy: WFL for tasks assessed/performed Overall Cognitive Status: Within Functional Limits for tasks assessed    Extremity/Trunk Assessment Upper Extremity Assessment Upper Extremity Assessment: Overall WFL for tasks assessed     Mobility Bed Mobility Bed Mobility: Supine to Sit;Sitting - Scoot to Edge of Bed;Sit to Supine Supine to Sit: 4: Min assist;HOB flat Sitting - Scoot to Edge of Bed: 4: Min assist Sit to Supine: 4: Min assist;HOB flat Details for Bed Mobility Assistance: Assist to support RLE. Incr time due to pain. Cueing for technique in order to maintain hip precautions. Transfers Transfers: Sit to Stand;Stand to Sit Sit to Stand: 4: Min guard;From bed;With upper extremity assist Stand to Sit: 4: Min guard;With upper extremity assist;To bed Details for Transfer Assistance: Min guard for safety. VCs for safe hand placement.  Exercise   Balance Balance Balance Assessed: No   End of Session OT - End of Session Equipment Utilized During Treatment: Gait belt;Rolling  walker Activity Tolerance: Patient tolerated treatment well Patient left: in bed;with call bell/phone within reach Nurse Communication: Mobility status  GO    01/14/2013 Cipriano Mile OTR/L Pager [phone] Office [phone]  Cipriano Mile 01/14/2013, 1:41 PM

## 2013-01-15 LAB — BASIC METABOLIC PANEL
BUN: 11 mg/dL (ref 6–23)
CO2: 26 mEq/L (ref 19–32)
Calcium: 8.8 mg/dL (ref 8.4–10.5)
Chloride: 103 mEq/L (ref 96–112)
Creatinine, Ser: 0.99 mg/dL (ref 0.50–1.10)
Glucose, Bld: 119 mg/dL — ABNORMAL HIGH (ref 70–99)

## 2013-01-15 LAB — CBC
HCT: 27.1 % — ABNORMAL LOW (ref 36.0–46.0)
MCH: 30.3 pg (ref 26.0–34.0)
MCV: 91.2 fL (ref 78.0–100.0)
Platelets: 259 10*3/uL (ref 150–400)
RBC: 2.97 MIL/uL — ABNORMAL LOW (ref 3.87–5.11)
WBC: 10.1 10*3/uL (ref 4.0–10.5)

## 2013-01-15 NOTE — Progress Notes (Signed)
SPORTS MEDICINE AND JOINT REPLACEMENT  Georgena Spurling, MD   Altamese Cabal, PA-C [address] [place], [place], [place]  [address]                             [phone]   PROGRESS NOTE  Subjective:  negative for Chest Pain  negative for Shortness of Breath  negative for Nausea/Vomiting   negative for Calf Pain  negative for Bowel Movement   Tolerating Diet: yes         Patient reports pain as 4 on 0-10 scale.    Objective: Vital signs in last 24 hours:   Patient Vitals for the past 24 hrs:  BP Temp Pulse Resp SpO2  01/15/13 0533 110/61 mmHg 98.5 F (36.9 C) 90 16 94 %  01/14/13 2046 104/58 mmHg 99 F (37.2 C) 94 18 91 %  01/14/13 1415 101/52 mmHg 98.4 F (36.9 C) 88 18 92 %  01/14/13 1200 - - - 18 -    @flow {1959:LAST@   Intake/Output from previous day:   09/05 0701 - 09/06 0700 In: 120 [P.O.:120] Out: 750 [Urine:750]   Intake/Output this shift:   09/06 0701 - 09/06 1900 In: 320 [P.O.:320] Out: -    Intake/Output     09/05 0701 - 09/06 0700 09/06 0701 - 09/07 0700   P.O. 120 320   I.V.     Total Intake 120 320   Urine 750    Total Output 750     Net -630 +320        Urine Occurrence 5 x 1 x      LABORATORY DATA:  Recent Labs  01/12/13 1129 01/13/13 0500 01/14/13 0625 01/15/13 0515  WBC 10.1 11.8* 11.6* 10.1  HGB 13.8 11.5* 9.5* 9.0*  HCT 40.0 34.9* 28.6* 27.1*  PLT 330 300 253 259    Recent Labs  01/12/13 1129 01/13/13 0500 01/14/13 0625 01/15/13 0515  NA 138 142 138 138  K 4.1 5.2* 4.2 4.2  CL 104 107 104 103  CO2 21 26 24 26   BUN 14 16 11 11   CREATININE 1.03 1.42* 1.02 0.99  GLUCOSE 87 124* 116* 119*  CALCIUM 9.6 8.5 8.8 8.8   Lab Results  Component Value Date   INR 1.01 01/12/2013    Examination:  General appearance: alert, cooperative and no distress Extremities: Homans sign is negative, no sign of DVT  Wound Exam: clean, dry, intact   Drainage:  None: wound tissue dry  Motor Exam: EHL and FHL Intact  Sensory Exam:  Deep Peroneal normal   Assessment:    3 Days Post-Op  Procedure(s) (LRB): TOTAL HIP ARTHROPLASTY WITH AUTOGRAFT (Right)  ADDITIONAL DIAGNOSIS:  Active Problems:   * No active hospital problems. *  Acute Blood Loss Anemia   Plan: Physical Therapy as ordered Weight Bearing as Tolerated (WBAT)    DISCHARGE PLAN: Skilled Nursing Facility/Rehab  DISCHARGE NEEDS: HHPT, CPM, Walker and 3-in-1 comode seat         Amia Rynders 01/15/2013, 9:18 AM

## 2013-01-15 NOTE — Progress Notes (Signed)
Pt d/c to [facility]  ( [facility] ) via family transport today.  Cori Razor LCSW [phone]

## 2013-06-22 ENCOUNTER — Emergency Department (HOSPITAL_COMMUNITY)
Admission: EM | Admit: 2013-06-22 | Discharge: 2013-06-22 | Disposition: A | Discharge status: Home / Self Care | Payer: 59 | Attending: Emergency Medicine | Admitting: Emergency Medicine

## 2013-06-22 ENCOUNTER — Encounter (HOSPITAL_COMMUNITY): Payer: Self-pay | Admitting: Emergency Medicine

## 2013-06-22 DIAGNOSIS — R112 Nausea with vomiting, unspecified: Secondary | ICD-10-CM | POA: Insufficient documentation

## 2013-06-22 DIAGNOSIS — Z87768 Personal history of other specified (corrected) congenital malformations of integument, limbs and musculoskeletal system: Secondary | ICD-10-CM | POA: Insufficient documentation

## 2013-06-22 DIAGNOSIS — E039 Hypothyroidism, unspecified: Secondary | ICD-10-CM | POA: Insufficient documentation

## 2013-06-22 DIAGNOSIS — Z8679 Personal history of other diseases of the circulatory system: Secondary | ICD-10-CM | POA: Insufficient documentation

## 2013-06-22 DIAGNOSIS — K219 Gastro-esophageal reflux disease without esophagitis: Secondary | ICD-10-CM | POA: Insufficient documentation

## 2013-06-22 DIAGNOSIS — Z8701 Personal history of pneumonia (recurrent): Secondary | ICD-10-CM | POA: Insufficient documentation

## 2013-06-22 DIAGNOSIS — Z7982 Long term (current) use of aspirin: Secondary | ICD-10-CM | POA: Insufficient documentation

## 2013-06-22 DIAGNOSIS — Z8739 Personal history of other diseases of the musculoskeletal system and connective tissue: Secondary | ICD-10-CM | POA: Insufficient documentation

## 2013-06-22 DIAGNOSIS — Z79899 Other long term (current) drug therapy: Secondary | ICD-10-CM | POA: Insufficient documentation

## 2013-06-22 DIAGNOSIS — F3289 Other specified depressive episodes: Secondary | ICD-10-CM | POA: Insufficient documentation

## 2013-06-22 DIAGNOSIS — H53149 Visual discomfort, unspecified: Secondary | ICD-10-CM | POA: Insufficient documentation

## 2013-06-22 DIAGNOSIS — R51 Headache: Secondary | ICD-10-CM | POA: Insufficient documentation

## 2013-06-22 DIAGNOSIS — F329 Major depressive disorder, single episode, unspecified: Secondary | ICD-10-CM | POA: Insufficient documentation

## 2013-06-22 DIAGNOSIS — R519 Headache, unspecified: Secondary | ICD-10-CM

## 2013-06-22 DIAGNOSIS — Z8776 Personal history of (corrected) congenital malformations of integument, limbs and musculoskeletal system: Secondary | ICD-10-CM | POA: Insufficient documentation

## 2013-06-22 MED ORDER — SODIUM CHLORIDE 0.9 % IV BOLUS (SEPSIS)
1000.0000 mL | Freq: Once | INTRAVENOUS | Status: AC
  Administered 2013-06-22: 1000 mL via INTRAVENOUS

## 2013-06-22 MED ORDER — KETOROLAC TROMETHAMINE 30 MG/ML IJ SOLN
30.0000 mg | Freq: Once | INTRAMUSCULAR | Status: DC
  Filled 2013-06-22: qty 1

## 2013-06-22 MED ORDER — METHOCARBAMOL 100 MG/ML IJ SOLN
1000.0000 mg | Freq: Once | INTRAMUSCULAR | Status: AC
  Administered 2013-06-22: 1000 mg via INTRAVENOUS
  Filled 2013-06-22: qty 10

## 2013-06-22 MED ORDER — ONDANSETRON 4 MG PO TBDP: 4.0000 mg | ORAL_TABLET | Freq: Three times a day (TID) | ORAL | Status: DC | PRN

## 2013-06-22 MED ORDER — METHOCARBAMOL 500 MG PO TABS: 500.0000 mg | ORAL_TABLET | Freq: Three times a day (TID) | ORAL | Status: DC | PRN

## 2013-06-22 MED ORDER — METOCLOPRAMIDE HCL 5 MG/ML IJ SOLN
10.0000 mg | Freq: Once | INTRAMUSCULAR | Status: AC
  Administered 2013-06-22: 10 mg via INTRAVENOUS
  Filled 2013-06-22: qty 2

## 2013-06-22 NOTE — ED Notes (Signed)
MD at bedside. 

## 2013-06-22 NOTE — ED Provider Notes (Signed)
CSN: [id number]     Arrival date & time 06/22/13  1219 History   First MD Initiated Contact with Patient 06/22/13 1523     Chief Complaint  Patient presents with  . Migraine     HPI  Patient presents with a headache. States she gets migraines although there is infrequent. Her last headache that she can recall was partially 2 years ago. Her headache started on Saturday 5 days ago. Slow onset bifrontal headache. This is typical for her. She states she has been photophobic and mild nausea. Vomited this morning several times. This is that her headache somewhat worse. Hasn't pain in the upper neck or basilar skull which is unusual for her previous tender to touch and simply an ice pack on it. No sudden on set or thunderclap-type event this is very slow and invalid headache over several days. No neck rigidity. No fever. No skin rash. Photophobic but no vision changes or loss. No difficult with balance coordination speech or swallowing.  Past Medical History  Diagnosis Date  . DDD (degenerative disc disease), lumbar   . Spine malformation   . Thyroid disease   . GERD (gastroesophageal reflux disease)   . Irregular heart rate   . Hypothyroidism   . Depression   . Pneumonia     hx  . Sleep apnea     unable to wear mask-sleep study 3-4 yrs ago   Past Surgical History  Procedure Laterality Date  . Abdominal hysterectomy    . Appendectomy    . Tonsillectomy    . Back surgery      l4 and l5  . Craniectomy suboccipital w/ cervical laminectomy / chiari  08  . Carpal tunnel release Bilateral   . Total hip arthroplasty Right 01/12/2013    Procedure: TOTAL HIP ARTHROPLASTY WITH AUTOGRAFT;  Surgeon: Ninetta Lights, MD;  Location: [facility];  Service: Orthopedics;  Laterality: Right;   History reviewed. No pertinent family history. History  Substance Use Topics  . Smoking status: Never Smoker   . Smokeless tobacco: Never Used     Comment: 2004 last crack   occ alcohol  . Alcohol Use: Yes   OB  History   Grav Para Term Preterm Abortions TAB SAB Ect Mult Living                 Review of Systems  Constitutional: Negative for fever, chills, diaphoresis, appetite change and fatigue.  HENT: Negative for mouth sores, sore throat and trouble swallowing.   Eyes: Negative for visual disturbance.  Respiratory: Negative for cough, chest tightness, shortness of breath and wheezing.   Cardiovascular: Negative for chest pain.  Gastrointestinal: Positive for nausea and vomiting. Negative for abdominal pain, diarrhea and abdominal distention.  Endocrine: Negative for polydipsia, polyphagia and polyuria.  Genitourinary: Negative for dysuria, frequency and hematuria.  Musculoskeletal: Negative for gait problem.  Skin: Negative for color change, pallor and rash.  Neurological: Positive for headaches. Negative for dizziness, syncope and light-headedness.  Hematological: Does not bruise/bleed easily.  Psychiatric/Behavioral: Negative for behavioral problems and confusion.      Allergies  Morphine and Diphenhydramine hcl  Home Medications   Current Outpatient Rx  Name  Route  Sig  Dispense  Refill  . aspirin EC 81 MG tablet   Oral   Take 81 mg by mouth daily.         . calcium-vitamin D (OSCAL WITH D) 500-200 MG-UNIT per tablet   Oral   Take 1 tablet by  mouth daily with breakfast.         . fenofibrate 160 MG tablet   Oral   Take 160 mg by mouth daily.           Marland Kitchen levothyroxine (SYNTHROID, LEVOTHROID) 125 MCG tablet   Oral   Take 125 mcg by mouth daily before breakfast.         . Multiple Vitamin (MULTIVITAMIN WITH MINERALS) TABS tablet   Oral   Take 1 tablet by mouth daily.         Marland Kitchen omeprazole (PRILOSEC) 20 MG capsule   Oral   Take 20 mg by mouth daily.           . sertraline (ZOLOFT) 100 MG tablet   Oral   Take 100 mg by mouth at bedtime.         . methocarbamol (ROBAXIN) 500 MG tablet   Oral   Take 1 tablet (500 mg total) by mouth 3 (three) times  daily between meals as needed.   20 tablet   0   . ondansetron (ZOFRAN ODT) 4 MG disintegrating tablet   Oral   Take 1 tablet (4 mg total) by mouth every 8 (eight) hours as needed for nausea.   20 tablet   0    BP 137/77  Pulse 79  Temp(Src) 97.8 F (36.6 C) (Oral)  Resp 16  SpO2 96% Physical Exam  Constitutional: She is oriented to person, place, and time. She appears well-developed and well-nourished. No distress.  HENT:  Head: Normocephalic.    Eyes: Conjunctivae are normal. Pupils are equal, round, and reactive to light. No scleral icterus.  Neck: Normal range of motion. Neck supple. No thyromegaly present.    Cardiovascular: Normal rate and regular rhythm.  Exam reveals no gallop and no friction rub.   No murmur heard. Pulmonary/Chest: Effort normal and breath sounds normal. No respiratory distress. She has no wheezes. She has no rales.  Abdominal: Soft. Bowel sounds are normal. She exhibits no distension. There is no tenderness. There is no rebound.  Musculoskeletal: Normal range of motion.  Neurological: She is alert and oriented to person, place, and time.  Skin: Skin is warm and dry. No rash noted.  Psychiatric: She has a normal mood and affect. Her behavior is normal.    ED Course  Procedures (including critical care time) Labs Review Labs Reviewed - No data to display Imaging Review No results found.  EKG Interpretation   None       MDM   Final diagnoses:  Headache    History and exam are consistent with that of a typical migraine headache. No concerning neurological symptoms or findings. Plan will be Reglan, Toradol, Robaxin. Reevaluation.  16:51:  Given for the patient's medications were completely infusions giggly for headache. This may be discharged. Given prescription for Robaxin, and Zofran. Recheck primary care.    Tanna Furry, MD 06/22/13 [phone]

## 2013-06-22 NOTE — ED Notes (Signed)
Initial Contact - pt resting on stretcher, reports migrane 10/10 to forehead and post. head x2 days, worsening.  Pt reports +nausea, reports still able to tolerate PO however.  Pt denies photo/phonophobia.  PERRL, 64mm.  MAEI, pt denies n/t to extremities, ambulating with steady gait.  Skin PWD.  Speaking full/clear sentences.  NAD.

## 2013-06-22 NOTE — Discharge Instructions (Signed)
Migraine Headache A migraine headache is an intense, throbbing pain on one or both sides of your head. A migraine can last for 30 minutes to several hours. CAUSES  The exact cause of a migraine headache is not always known. However, a migraine may be caused when nerves in the brain become irritated and release chemicals that cause inflammation. This causes pain. Certain things may also trigger migraines, such as:  Alcohol.  Smoking.  Stress.  Menstruation.  Aged cheeses.  Foods or drinks that contain nitrates, glutamate, aspartame, or tyramine.  Lack of sleep.  Chocolate.  Caffeine.  Hunger.  Physical exertion.  Fatigue.  Medicines used to treat chest pain (nitroglycerine), birth control pills, estrogen, and some blood pressure medicines. SIGNS AND SYMPTOMS  Pain on one or both sides of your head.  Pulsating or throbbing pain.  Severe pain that prevents daily activities.  Pain that is aggravated by any physical activity.  Nausea, vomiting, or both.  Dizziness.  Pain with exposure to bright lights, loud noises, or activity.  General sensitivity to bright lights, loud noises, or smells. Before you get a migraine, you may get warning signs that a migraine is coming (aura). An aura may include:  Seeing flashing lights.  Seeing bright spots, halos, or zig-zag lines.  Having tunnel vision or blurred vision.  Having feelings of numbness or tingling.  Having trouble talking.  Having muscle weakness. DIAGNOSIS  A migraine headache is often diagnosed based on:  Symptoms.  Physical exam.  A CT scan or MRI of your head. These imaging tests cannot diagnose migraines, but they can help rule out other causes of headaches. TREATMENT Medicines may be given for pain and nausea. Medicines can also be given to help prevent recurrent migraines.  HOME CARE INSTRUCTIONS  Only take over-the-counter or prescription medicines for pain or discomfort as directed by your  health care provider. The use of long-term narcotics is not recommended.  Lie down in a dark, quiet room when you have a migraine.  Keep a journal to find out what may trigger your migraine headaches. For example, write down:  What you eat and drink.  How much sleep you get.  Any change to your diet or medicines.  Limit alcohol consumption.  Quit smoking if you smoke.  Get 7 9 hours of sleep, or as recommended by your health care provider.  Limit stress.  Keep lights dim if bright lights bother you and make your migraines worse. SEEK IMMEDIATE MEDICAL CARE IF:   Your migraine becomes severe.  You have a fever.  You have a stiff neck.  You have vision loss.  You have muscular weakness or loss of muscle control.  You start losing your balance or have trouble walking.  You feel faint or pass out.  You have severe symptoms that are different from your first symptoms. MAKE SURE YOU:   Understand these instructions.  Will watch your condition.  Will get help right away if you are not doing well or get worse. Document Released: 04/28/2005 Document Revised: 02/16/2013 Document Reviewed: 01/03/2013 [employer] Patient Information 2014 [employer].  

## 2013-06-22 NOTE — ED Notes (Addendum)
Pt c/o increasing migraine x 2 days and emesis starting today. Pain score 10/10.  Pt sts migraines are typically anterior, but this one is posterior.  Hx of migraines.  Reports taking ibuprofen w/o relief.

## 2013-09-14 ENCOUNTER — Ambulatory Visit (INDEPENDENT_AMBULATORY_CARE_PROVIDER_SITE_OTHER): Payer: 59 | Admitting: Emergency Medicine

## 2013-09-14 VITALS — BP 124/76 | HR 96 | Temp 97.7°F | Resp 16 | Ht 65.0 in | Wt 217.0 lb

## 2013-09-14 DIAGNOSIS — J018 Other acute sinusitis: Secondary | ICD-10-CM

## 2013-09-14 DIAGNOSIS — J209 Acute bronchitis, unspecified: Secondary | ICD-10-CM

## 2013-09-14 MED ORDER — AMOXICILLIN-POT CLAVULANATE 875-125 MG PO TABS: 1.0000 | ORAL_TABLET | Freq: Two times a day (BID) | ORAL | Status: DC

## 2013-09-14 MED ORDER — HYDROCOD POLST-CHLORPHEN POLST 10-8 MG/5ML PO LQCR: 5.0000 mL | Freq: Two times a day (BID) | ORAL | Status: DC | PRN

## 2013-09-14 NOTE — Progress Notes (Signed)
[facility] [address], [place] [address] [phone]  Date:  09/14/2013   Name:  [name] [name]   DOB:  1956-03-15   MRN:  [id number]  PCP:  Reginia Naas, MD    Chief Complaint: Sinusitis   History of Present Illness:  [name] [name] is a 58 y.o. very pleasant female patient who presents with the following:  Ill since early in the weekend.  Has nasal congestion and purulent drainage.  Has a post nasal drip and is now hoarse with a sore throat.  Cough is persistent and productive of purulent sputum.  Some exertional shortness of breath.  No wheezing. No nausea or vomiting.  Fever Monday.  No chills.  No improvement with over the counter medications or other home remedies. Denies other complaint or health concern today.   Patient Active Problem List   Diagnosis Date Noted  . HYPOTHYROIDISM 11/07/2009  . CALLUSES, LEFT FOOT 11/07/2009  . [place], [place] 11/07/2009    Past Medical History  Diagnosis Date  . DDD (degenerative disc disease), lumbar   . Spine malformation   . Thyroid disease   . GERD (gastroesophageal reflux disease)   . Irregular heart rate   . Hypothyroidism   . Depression   . Pneumonia     hx  . Sleep apnea     unable to wear mask-sleep study 3-4 yrs ago    Past Surgical History  Procedure Laterality Date  . Abdominal hysterectomy    . Appendectomy    . Tonsillectomy    . Back surgery      l4 and l5  . Craniectomy suboccipital w/ cervical laminectomy / chiari  08  . Carpal tunnel release Bilateral   . Total hip arthroplasty Right 01/12/2013    Procedure: TOTAL HIP ARTHROPLASTY WITH AUTOGRAFT;  Surgeon: Ninetta Lights, MD;  Location: [facility];  Service: Orthopedics;  Laterality: Right;    History  Substance Use Topics  . Smoking status: Never Smoker   . Smokeless tobacco: Never Used     Comment: 2004 last crack   occ alcohol  . Alcohol Use: Yes    No family history on file.  Allergies  Allergen Reactions  .  Morphine     REACTION: Bradycardia, Hypotension  . Diphenhydramine Hcl Palpitations    Medication list has been reviewed and updated.  Current Outpatient Prescriptions on File Prior to Visit  Medication Sig Dispense Refill  . aspirin EC 81 MG tablet Take 81 mg by mouth daily.      . calcium-vitamin D (OSCAL WITH D) 500-200 MG-UNIT per tablet Take 1 tablet by mouth daily with breakfast.      . fenofibrate 160 MG tablet Take 160 mg by mouth daily.        Marland Kitchen levothyroxine (SYNTHROID, LEVOTHROID) 125 MCG tablet Take 125 mcg by mouth daily before breakfast.      . Multiple Vitamin (MULTIVITAMIN WITH MINERALS) TABS tablet Take 1 tablet by mouth daily.      Marland Kitchen omeprazole (PRILOSEC) 20 MG capsule Take 20 mg by mouth daily.        . sertraline (ZOLOFT) 100 MG tablet Take 100 mg by mouth at bedtime.      . methocarbamol (ROBAXIN) 500 MG tablet Take 1 tablet (500 mg total) by mouth 3 (three) times daily between meals as needed.  20 tablet  0  . ondansetron (ZOFRAN ODT) 4 MG disintegrating tablet Take 1 tablet (4 mg total) by  mouth every 8 (eight) hours as needed for nausea.  20 tablet  0   No current facility-administered medications on file prior to visit.    Review of Systems:  As per HPI, otherwise negative.    Physical Examination: Filed Vitals:   09/14/13 1530  BP: 124/76  Pulse: 96  Temp: 97.7 F (36.5 C)  Resp: 16   Filed Vitals:   09/14/13 1530  Height: 5\' 5"  (1.651 m)  Weight: 217 lb (98.431 kg)   Body mass index is 36.11 kg/(m^2). Ideal Body Weight: Weight in (lb) to have BMI = 25: 149.9  GEN: WDWN, NAD, Non-toxic, A & O x 3 HEENT: Atraumatic, Normocephalic. Neck supple. No masses, No LAD. Ears and Nose: No external deformity. CV: RRR, No M/G/R. No JVD. No thrill. No extra heart sounds. PULM: CTA B, no wheezes, crackles, rhonchi. No retractions. No resp. distress. No accessory muscle use. ABD: S, NT, ND, +BS. No rebound. No HSM. EXTR: No c/c/e NEURO Normal gait.   PSYCH: Normally interactive. Conversant. Not depressed or anxious appearing.  Calm demeanor.     Assessment and Plan: Sinusitis  Bronchitis Phen c cod augmentin  Signed,  Ellison Carwin, MD

## 2013-09-14 NOTE — Patient Instructions (Signed)

## 2013-09-18 ENCOUNTER — Telehealth: Payer: Self-pay | Admitting: Emergency Medicine

## 2013-09-18 NOTE — Telephone Encounter (Signed)
Pt notified that letter is ready for pick up

## 2013-09-18 NOTE — Telephone Encounter (Signed)
Patient was seen on 09/14/2013 for an illness and was written out of work on 09/15/2013. Patient was out of work on Friday 09/16/2013 and needs a excuse note for that day as well. States she plans to return to work on Monday 09/19/2013.   [phone]

## 2014-01-22 ENCOUNTER — Emergency Department: Payer: Self-pay | Admitting: Emergency Medicine

## 2014-02-01 ENCOUNTER — Ambulatory Visit
Admission: RE | Admit: 2014-02-01 | Discharge: 2014-02-01 | Disposition: A | Discharge status: Home / Self Care | Payer: 59 | Source: Ambulatory Visit | Attending: Family Medicine | Admitting: Family Medicine

## 2014-02-01 ENCOUNTER — Other Ambulatory Visit: Payer: Self-pay | Admitting: Family Medicine

## 2014-02-01 DIAGNOSIS — J40 Bronchitis, not specified as acute or chronic: Secondary | ICD-10-CM

## 2014-03-12 ENCOUNTER — Emergency Department: Payer: Self-pay | Admitting: Emergency Medicine

## 2014-05-11 ENCOUNTER — Ambulatory Visit (INDEPENDENT_AMBULATORY_CARE_PROVIDER_SITE_OTHER): Payer: 59 | Admitting: Family Medicine

## 2014-05-11 VITALS — BP 140/80 | HR 96 | Temp 97.6°F | Resp 18 | Ht 65.5 in | Wt 204.8 lb

## 2014-05-11 DIAGNOSIS — J069 Acute upper respiratory infection, unspecified: Secondary | ICD-10-CM

## 2014-05-11 MED ORDER — IPRATROPIUM BROMIDE 0.03 % NA SOLN: 2.0000 | Freq: Two times a day (BID) | NASAL | Status: DC

## 2014-05-11 MED ORDER — HYDROCOD POLST-CHLORPHEN POLST 10-8 MG/5ML PO LQCR: 5.0000 mL | Freq: Two times a day (BID) | ORAL | Status: DC | PRN

## 2014-05-11 MED ORDER — DM-GUAIFENESIN ER 30-600 MG PO TB12: 1.0000 | ORAL_TABLET | Freq: Two times a day (BID) | ORAL | Status: DC

## 2014-05-11 NOTE — Progress Notes (Signed)
Subjective:  This chart was scribed for Wardell Honour, MD by Ladene Artist, ED Scribe. The patient was seen in room 8. Patient's care was started at 3:26 PM.   Patient ID: [name] [name], female    DOB: [birth year], 58 y.o.   MRN: [id number]  05/11/2014  Cough; Sore Throat; and Nasal Congestion  HPI HPI Comments: [name] [name] is a 58 y.o. female, with a h/o hypothyroidism, GERD, depression, who presents to the [facility] complaining of persistent sore throat onset 3 days ago. She reports associated congestion, postnasal drip, night sweats, chills, occasional HA, clear rhinorrhea, cough onset 2 days ago. Pt denies fever, ear pain, SOB, wheezing, vomiting, diarrhea. Pt has been treating with DayQuil and daytime sinus medication. She reports bronchitis x2-3 annually. Pt is a nonsmoker. No h/o asthma.   PCP: Reginia Naas, MD  Review of Systems  Constitutional: Positive for chills and diaphoresis. Negative for fever.  HENT: Positive for congestion, postnasal drip, rhinorrhea and sore throat. Negative for ear pain.   Respiratory: Positive for cough. Negative for shortness of breath and wheezing.   Gastrointestinal: Negative for vomiting and diarrhea.  Neurological: Positive for headaches.   Past Medical History  Diagnosis Date  . DDD (degenerative disc disease), lumbar   . Spine malformation   . Thyroid disease   . GERD (gastroesophageal reflux disease)   . Irregular heart rate   . Hypothyroidism   . Depression   . Pneumonia     hx  . Sleep apnea     unable to wear mask-sleep study 3-4 yrs ago   Past Surgical History  Procedure Laterality Date  . Abdominal hysterectomy    . Appendectomy    . Tonsillectomy    . Back surgery      l4 and l5  . Craniectomy suboccipital w/ cervical laminectomy / chiari  08  . Carpal tunnel release Bilateral   . Total hip arthroplasty Right 01/12/2013    Procedure: TOTAL HIP ARTHROPLASTY WITH AUTOGRAFT;  Surgeon: Ninetta Lights, MD;  Location: [facility];  Service: Orthopedics;  Laterality: Right;   Allergies  Allergen Reactions  . Morphine     REACTION: Bradycardia, Hypotension  . Diphenhydramine Hcl Palpitations   Current Outpatient Prescriptions  Medication Sig Dispense Refill  . aspirin EC 81 MG tablet Take 81 mg by mouth daily.    . calcium-vitamin D (OSCAL WITH D) 500-200 MG-UNIT per tablet Take 1 tablet by mouth daily with breakfast.    . chlorpheniramine-HYDROcodone (TUSSIONEX PENNKINETIC ER) 10-8 MG/5ML LQCR Take 5 mLs by mouth every 12 (twelve) hours as needed. 120 mL 0  . fenofibrate 160 MG tablet Take 160 mg by mouth daily.      . Multiple Vitamin (MULTIVITAMIN WITH MINERALS) TABS tablet Take 1 tablet by mouth daily.    Marland Kitchen omeprazole (PRILOSEC) 20 MG capsule Take 20 mg by mouth daily.      . sertraline (ZOLOFT) 100 MG tablet Take 100 mg by mouth at bedtime.    Marland Kitchen dextromethorphan-guaiFENesin (MUCINEX DM) 30-600 MG per 12 hr tablet Take 1 tablet by mouth 2 (two) times daily. 20 tablet 0  . ipratropium (ATROVENT) 0.03 % nasal spray Place 2 sprays into the nose 2 (two) times daily. 30 mL 0  . levothyroxine (SYNTHROID, LEVOTHROID) 125 MCG tablet Take 125 mcg by mouth daily before breakfast.    . methocarbamol (ROBAXIN) 500 MG tablet Take 1 tablet (500 mg total) by mouth 3 (three) times  daily between meals as needed. (Patient not taking: Reported on 05/11/2014) 20 tablet 0  . ondansetron (ZOFRAN ODT) 4 MG disintegrating tablet Take 1 tablet (4 mg total) by mouth every 8 (eight) hours as needed for nausea. (Patient not taking: Reported on 05/11/2014) 20 tablet 0   No current facility-administered medications for this visit.       Objective:    BP 140/80 mmHg  Pulse 96  Temp(Src) 97.6 F (36.4 C) (Oral)  Resp 18  Ht 5' 5.5" (1.664 m)  Wt 204 lb 12.8 oz (92.897 kg)  BMI 33.55 kg/m2  SpO2 96% Physical Exam  Constitutional: She is oriented to person, place, and time. She appears well-developed and  well-nourished. No distress.  HENT:  Head: Normocephalic and atraumatic.  Right Ear: Tympanic membrane and external ear normal.  Left Ear: Tympanic membrane and external ear normal.  Nose: Rhinorrhea present.  Mouth/Throat: Oropharynx is clear and moist. No oropharyngeal exudate.  Eyes: Conjunctivae and EOM are normal. Pupils are equal, round, and reactive to light.  Neck: Normal range of motion. Neck supple. No thyromegaly present.  Cardiovascular: Normal rate, regular rhythm and normal heart sounds.  Exam reveals no gallop and no friction rub.   No murmur heard. Pulmonary/Chest: Effort normal and breath sounds normal. She has no wheezes. She has no rales.  Musculoskeletal: Normal range of motion.  Lymphadenopathy:    She has cervical adenopathy.  Neurological: She is alert and oriented to person, place, and time.  Skin: Skin is warm and dry. She is not diaphoretic.  Psychiatric: She has a normal mood and affect. Her behavior is normal.  Nursing note and vitals reviewed.      Assessment & Plan:   1. Acute upper respiratory infection      -New. -Rx for Mucinex DM, Tussionex, Atrovent nasal spray provided. -Call if no improvement in one week; will treat with Augmentin for sinusitis.    Meds ordered this encounter  Medications  . dextromethorphan-guaiFENesin (MUCINEX DM) 30-600 MG per 12 hr tablet    Sig: Take 1 tablet by mouth 2 (two) times daily.    Dispense:  20 tablet    Refill:  0  . chlorpheniramine-HYDROcodone (TUSSIONEX PENNKINETIC ER) 10-8 MG/5ML LQCR    Sig: Take 5 mLs by mouth every 12 (twelve) hours as needed.    Dispense:  120 mL    Refill:  0  . ipratropium (ATROVENT) 0.03 % nasal spray    Sig: Place 2 sprays into the nose 2 (two) times daily.    Dispense:  30 mL    Refill:  0    No Follow-up on file.   I personally performed the services described in this documentation, which was scribed in my presence. The recorded information has been reviewed and  considered.    Odaly Peri Elayne Guerin, M.D. [facility] [address] [place], [place]  [address] [phone] phone [phone] fax

## 2014-05-11 NOTE — Patient Instructions (Signed)
Upper Respiratory Infection, Adult An upper respiratory infection (URI) is also sometimes known as the common cold. The upper respiratory tract includes the nose, sinuses, throat, trachea, and bronchi. Bronchi are the airways leading to the lungs. Most people improve within 1 week, but symptoms can last up to 2 weeks. A residual cough may last even longer.  CAUSES Many different viruses can infect the tissues lining the upper respiratory tract. The tissues become irritated and inflamed and often become very moist. Mucus production is also common. A cold is contagious. You can easily spread the virus to others by oral contact. This includes kissing, sharing a glass, coughing, or sneezing. Touching your mouth or nose and then touching a surface, which is then touched by another person, can also spread the virus. SYMPTOMS  Symptoms typically develop 1 to 3 days after you come in contact with a cold virus. Symptoms vary from person to person. They may include:  Runny nose.  Sneezing.  Nasal congestion.  Sinus irritation.  Sore throat.  Loss of voice (laryngitis).  Cough.  Fatigue.  Muscle aches.  Loss of appetite.  Headache.  Low-grade fever. DIAGNOSIS  You might diagnose your own cold based on familiar symptoms, since most people get a cold 2 to 3 times a year. Your caregiver can confirm this based on your exam. Most importantly, your caregiver can check that your symptoms are not due to another disease such as strep throat, sinusitis, pneumonia, asthma, or epiglottitis. Blood tests, throat tests, and X-rays are not necessary to diagnose a common cold, but they may sometimes be helpful in excluding other more serious diseases. Your caregiver will decide if any further tests are required. RISKS AND COMPLICATIONS  You may be at risk for a more severe case of the common cold if you smoke cigarettes, have chronic heart disease (such as heart failure) or lung disease (such as asthma), or if  you have a weakened immune system. The very young and very old are also at risk for more serious infections. Bacterial sinusitis, middle ear infections, and bacterial pneumonia can complicate the common cold. The common cold can worsen asthma and chronic obstructive pulmonary disease (COPD). Sometimes, these complications can require emergency medical care and may be life-threatening. PREVENTION  The best way to protect against getting a cold is to practice good hygiene. Avoid oral or hand contact with people with cold symptoms. Wash your hands often if contact occurs. There is no clear evidence that vitamin C, vitamin E, echinacea, or exercise reduces the chance of developing a cold. However, it is always recommended to get plenty of rest and practice good nutrition. TREATMENT  Treatment is directed at relieving symptoms. There is no cure. Antibiotics are not effective, because the infection is caused by a virus, not by bacteria. Treatment may include:  Increased fluid intake. Sports drinks offer valuable electrolytes, sugars, and fluids.  Breathing heated mist or steam (vaporizer or shower).  Eating chicken soup or other clear broths, and maintaining good nutrition.  Getting plenty of rest.  Using gargles or lozenges for comfort.  Controlling fevers with ibuprofen or acetaminophen as directed by your caregiver.  Increasing usage of your inhaler if you have asthma. Zinc gel and zinc lozenges, taken in the first 24 hours of the common cold, can shorten the duration and lessen the severity of symptoms. Pain medicines may help with fever, muscle aches, and throat pain. A variety of non-prescription medicines are available to treat congestion and runny nose. Your caregiver   can make recommendations and may suggest nasal or lung inhalers for other symptoms.  HOME CARE INSTRUCTIONS   Only take over-the-counter or prescription medicines for pain, discomfort, or fever as directed by your  caregiver.  Use a warm mist humidifier or inhale steam from a shower to increase air moisture. This may keep secretions moist and make it easier to breathe.  Drink enough water and fluids to keep your urine clear or pale yellow.  Rest as needed.  Return to work when your temperature has returned to normal or as your caregiver advises. You may need to stay home longer to avoid infecting others. You can also use a face mask and careful hand washing to prevent spread of the virus. SEEK MEDICAL CARE IF:   After the first few days, you feel you are getting worse rather than better.  You need your caregiver's advice about medicines to control symptoms.  You develop chills, worsening shortness of breath, or brown or red sputum. These may be signs of pneumonia.  You develop yellow or brown nasal discharge or pain in the face, especially when you bend forward. These may be signs of sinusitis.  You develop a fever, swollen neck glands, pain with swallowing, or white areas in the back of your throat. These may be signs of strep throat. SEEK IMMEDIATE MEDICAL CARE IF:   You have a fever.  You develop severe or persistent headache, ear pain, sinus pain, or chest pain.  You develop wheezing, a prolonged cough, cough up blood, or have a change in your usual mucus (if you have chronic lung disease).  You develop sore muscles or a stiff neck. Document Released: 10/22/2000 Document Revised: 07/21/2011 Document Reviewed: 08/03/2013 Exit[employer] Patient Information 2015 [employer]. This information is not intended to replace advice given to you by your health care provider. Make sure you discuss any questions you have with your health care provider.  

## 2014-06-18 LAB — URINALYSIS, COMPLETE
BILIRUBIN, UR: NEGATIVE
Glucose,UR: NEGATIVE mg/dL (ref 0–75)
KETONE: NEGATIVE
Nitrite: NEGATIVE
PH: 7 (ref 4.5–8.0)
PROTEIN: NEGATIVE
RBC,UR: <1 /HPF (ref 0–5)
SPECIFIC GRAVITY: 1.009 (ref 1.003–1.030)
SQUAMOUS EPITHELIAL: NONE SEEN
WBC UR: 10 /HPF (ref 0–5)

## 2014-06-18 LAB — DRUG SCREEN, URINE
AMPHETAMINES, UR SCREEN: NEGATIVE (ref ?–1000)
BENZODIAZEPINE, UR SCRN: NEGATIVE (ref ?–200)
Barbiturates, Ur Screen: NEGATIVE (ref ?–200)
Cannabinoid 50 Ng, Ur ~~LOC~~: POSITIVE (ref ?–50)
Cocaine Metabolite,Ur ~~LOC~~: NEGATIVE (ref ?–300)
MDMA (ECSTASY) UR SCREEN: NEGATIVE (ref ?–500)
METHADONE, UR SCREEN: NEGATIVE (ref ?–300)
Opiate, Ur Screen: NEGATIVE (ref ?–300)
Phencyclidine (PCP) Ur S: NEGATIVE (ref ?–25)
TRICYCLIC, UR SCREEN: NEGATIVE (ref ?–1000)

## 2014-06-18 LAB — CBC
HCT: 37.6 % (ref 35.0–47.0)
HGB: 12.3 g/dL (ref 12.0–16.0)
MCH: 28.9 pg (ref 26.0–34.0)
MCHC: 32.7 g/dL (ref 32.0–36.0)
MCV: 88 fL (ref 80–100)
Platelet: 294 10*3/uL (ref 150–440)
RBC: 4.26 10*6/uL (ref 3.80–5.20)
RDW: 13.4 % (ref 11.5–14.5)
WBC: 11.1 10*3/uL — ABNORMAL HIGH (ref 3.6–11.0)

## 2014-06-18 LAB — COMPREHENSIVE METABOLIC PANEL
ALBUMIN: 3.8 g/dL (ref 3.4–5.0)
ALK PHOS: 81 U/L (ref 46–116)
ANION GAP: 7 (ref 7–16)
BILIRUBIN TOTAL: 0.3 mg/dL (ref 0.2–1.0)
BUN: 22 mg/dL — ABNORMAL HIGH (ref 7–18)
CO2: 27 mmol/L (ref 21–32)
CREATININE: 1.02 mg/dL (ref 0.60–1.30)
Calcium, Total: 9.3 mg/dL (ref 8.5–10.1)
Chloride: 105 mmol/L (ref 98–107)
EGFR (African American): >60
GFR CALC NON AF AMER: 59 — AB
Glucose: 82 mg/dL (ref 65–99)
OSMOLALITY: 280 (ref 275–301)
POTASSIUM: 4 mmol/L (ref 3.5–5.1)
SGOT(AST): 38 U/L — ABNORMAL HIGH (ref 15–37)
SGPT (ALT): 39 U/L (ref 14–63)
SODIUM: 139 mmol/L (ref 136–145)
Total Protein: 7.3 g/dL (ref 6.4–8.2)

## 2014-06-18 LAB — ETHANOL

## 2014-06-18 LAB — ACETAMINOPHEN LEVEL

## 2014-06-18 LAB — SALICYLATE LEVEL: SALICYLATES, SERUM: 2.1 mg/dL

## 2014-06-19 ENCOUNTER — Inpatient Hospital Stay: Payer: Self-pay | Admitting: Psychiatry

## 2014-09-08 ENCOUNTER — Emergency Department
Admit: 2014-09-08 | Disposition: A | Discharge status: Home / Self Care | Payer: Self-pay | Admitting: Emergency Medicine

## 2014-09-08 LAB — TROPONIN I: Troponin-I: <0.03 ng/mL

## 2014-09-08 LAB — BASIC METABOLIC PANEL
Anion Gap: 10 (ref 7–16)
BUN: 17 mg/dL
CO2: 21 mmol/L — AB
CREATININE: 1.35 mg/dL — AB
Calcium, Total: 9.3 mg/dL
Chloride: 106 mmol/L
GFR CALC AF AMER: 50 — AB
GFR CALC NON AF AMER: 43 — AB
Glucose: 127 mg/dL — ABNORMAL HIGH
POTASSIUM: 3.6 mmol/L
Sodium: 137 mmol/L

## 2014-09-08 LAB — CBC
HCT: 39.2 % (ref 35.0–47.0)
HGB: 12.9 g/dL (ref 12.0–16.0)
MCH: 28.5 pg (ref 26.0–34.0)
MCHC: 32.9 g/dL (ref 32.0–36.0)
MCV: 87 fL (ref 80–100)
Platelet: 369 10*3/uL (ref 150–440)
RBC: 4.52 10*6/uL (ref 3.80–5.20)
RDW: 12.6 % (ref 11.5–14.5)
WBC: 10.7 10*3/uL (ref 3.6–11.0)

## 2014-09-08 LAB — PRO B NATRIURETIC PEPTIDE: B-Type Natriuretic Peptide: 17 pg/mL

## 2014-09-10 NOTE — Consult Note (Signed)
PATIENT NAME:  [name] [name], [name] [name] MR#:  [id number] DATE OF BIRTH:  1955/09/01  DATE OF CONSULTATION:  06/18/2014  CONSULTING PHYSICIAN:  Demarrion Meiklejohn K. Allyn Bertoni, MD  AGE: 59 years  SEX:  Female  RACE:  White  SUBJECTIVE:  The patient was seen in consultation in [facility]. The patient is a 59 year old white female, who has 2 jobs and works in Therapist, art and now also at [employer]. The patient divorced twice and recently got back with her second husband, who moved in with her.  The patient reports to increasing depression and not able to cope with the head of her parents in 2011 and 1214 from natural causes. She was really close with her parents and she used to live with them 24/7 and loss of her parents caused her financial stress and also emotional stress and loneliness. The patient started having increasing depression and came here for help.    PAST PSYCHIATRIC HISTORY:  No previous history of inpatient psychiatry.  No history of suicide attempt.  Not being followed by any psychiatrist.    ALCOHOL AND DRUGS:  Admits that she did smoke crack cocaine for 4 years and she quit it and got sober since 2004.  She had a couple of hits of [facility] recently.    MENTAL STATUS: The patient is alert and oriented, pleasant and cooperative, no agitation. Affect is sad, mood depressed, teary-eyed talking about her pain  and not able to cope. Admits to hopeless, helpless, admits feeling worthless and useless.  Did have some suicidal ideation, but she wants to contract for safety, wants to get help.  No psychosis.  Does not appear to be responding to internal stimuli.  Insight and judgment guarded.  Impulse control poor.  IMPRESSION:  1.  Major depressive disorder, single episode, with suicidal ideas, contracts for safety.  2.  Crack cocaine dependence, in remission, since 2004.   3.  THC abuse.   RECOMMENDATION:  Recommend inpatient hospital on psychiatry for close observation and help.  She will be started  on antidepressant medications which will be adjusted by the floor physician and she will be given help with her depression.   ____________________________ Wallace Cullens. Franchot Mimes, MD skc:LT D: 06/18/2014 17:54:11 ET T: 06/18/2014 18:13:10 ET JOB#: [id number]  cc: Arlyn Leak K. Franchot Mimes, MD, <Dictator> Dewain Penning MD ELECTRONICALLY SIGNED 06/24/2014 16:24

## 2014-09-10 NOTE — H&P (Signed)
PATIENT NAME:  [name] [name], Bischof MR#:  [id number] DATE OF BIRTH:  09/24/1955  DATE OF ADMISSION:  06/19/2014  IDENTIFYING INFORMATION: A 59 year old single Caucasian female from [place], [place].   CHIEF COMPLAINT: "I am overwhelmed."   HISTORY OF PRESENT ILLNESS: The patient presented voluntarily to our Emergency Department for psychiatric evaluation. She reports that her depression started a few years ago after her mother passed away. She explains that her mother was her best friend. Soon after that, about a year later, her father also passed away. The patient said that having them dying was devastating for her.  She has not been able to grieve. She feels depressed. She is having thoughts of not wanting to wake up, not caring whether she lives or dies.  She wants to give up.  She feels worthless and helpless and overwhelmed. She explains that she has been working 2 jobs. She is in debt for helping her father pay for her mother's funeral services. Most of these payments were made with credit cards and the patient is now trying to pay the bills unsuccessfully. The patient also had some problems that were prior to her admission, as she had fallen. She had problems with work, with 1 of her jobs prior to admission.  She explains that she had fallen last week and contacted her job, her employer, [employer], informing her that she had a fall and was having leg pain and therefore she did not feel able to go to work that day (she works as a Scientist, water quality). However, she states that she was mistreated and after that she felt that nobody at her work cared about her. The patient complains of having poor energy, poor appetite, poor concentration, and insomnia. As far as suicidality, the patient states "I'm too chicken"; however, she does not care what will happen to her and she feels ready to die "if the Rich Fuchs will take her."   The patient stated that she sought help briefly. The patient states that she sought  help through her primary care provider who prescribed her with Zoloft; however,  the cost of the 90 day supply of Zoloft was too high and she decided to discontinue the treatment as she was not able to afford it.    SUBSTANCE ABUSE:  The patient does reports smoking marijuana which she usually does about 3 times a week prior to sleep, as this is the only thing that she feels helps her with sleep. She denies the use of alcohol or any other illicit substances. The patient denies smoking cigarettes. PAST PSYCHIATRIC HISTORY: Negative, noncontributory. No history of suicidal attempts or having self-injurious behavior. She was treated for depression by her primary care provider who prescribed her with Zoloft 50 mg. The patient did report some improvement initially; however, once the improvement stopped she contacted her PCP who instructed to increase the Zoloft to 100 mg; however, she did not continue this for long as it was too expensive for her.   PAST MEDICAL HISTORY: Positive for hypothyroidism, GERD, and hyperlipidemia. She also claims to have an irregular heartbeat for which she was told to take aspirin 81 mg a day.   FAMILY HISTORY: Noncontributory.  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 The patient appears to be lonely, and she lives alone. Her main contact and social life was around her parents who passed  away in the last 3 years. The patient is single, never married, does not have any children. No illegal history.   ALLERGIES: MORPHINE AND ANTIHISTAMINICS.  REVIEW OF SYSTEMS: The patient denies nausea, vomiting, or diarrhea.  The rest of the 10 review of systems is negative.   MENTAL STATUS EXAMINATION: The patient is a 59 year old Caucasian female who appears older than her stated age. She displays limited grooming and hygiene. She was very tearful during the assessment. Psychomotor  activity is decreased. Eye contact was within normal limits. She was calm, pleasant, and cooperative. Speech was regular tone, volume, and rate. Thought process is linear. Thought content is positive for passive suicidality, negative for hallucinations or homicidality. Mood is dysphoric. Her affect is blunted. Insight and judgment is limited. Cognitive examination, she is alert and oriented to person, place, time, and situation. Fund of knowledge appears to be average for her level of education.   PHYSICAL EXAMINATION: GENERAL: The patient is a 59 year old Caucasian female in no acute distress.  VITAL SIGNS: Blood pressure 144/85, respirations 20, pulse 66, temperature 97.7.  MUSCULOSKELETAL: Normal gait, normal muscular tone. No evidence of involuntary movements.   LABORATORY RESULTS: BUN 22, creatinine 1.02, sodium 139, potassium 4, calcium 9.3. Alcohol was below detection limit. AST 38, ALT 39. Urine toxicology screen was positive for cannabis. CBC was negative. UA shows a UTI and a urine culture shows greater than 100,000 organisms per mL. Positive for gram-negative rod. Acetaminophen and salicylate level were below detection limit.   DIAGNOSES: 1.  Major depressive disorder, single episode, severe. 2.  Cannabis use disorder, moderate. 3.  Hypothyroidism. 4.  Dyslipidemia. 5.  Gastroesophageal reflux disease.  6.  Rule out arrhythmia.  ASSESSMENT:  A 59 year old who has developed severe depression after the death of both of her parents, who had passed away over the last couple of years. The patient has passive suicidal ideation, feelings of hopelessness and worthlessness.   PLAN:  1.  For depression, the patient will be restarted on sertraline 50 mg p.o. daily, which was effective when initiated by her primary care provider. 2.  For insomnia, I will start her on trazodone 100 mg p.o. at bedtime.  3.  For hypothyroidism, she will be continued on Synthroid 125 mcg a day.  4.  For  dyslipidemia, she will be continued on fenofibrate 160 mg p.o. daily.  5.  For GERD, she will be continued on pantoprazole 40 mg p.o. daily. 6.  For cardiovascular health, she will be continued on aspirin 81 mg p.o. daily.   DISCHARGE DISPOSITION: The patient will be observed closely as her depression is severe and she is having passive suicidal ideation along with hopelessness and helplessness. Once stable, she will be discharged back to her home and she will be set up to follow up with an outpatient psychiatrist and a therapist.    ____________________________ Hildred Priest, MD ahg:LT D: 06/20/2014 16:23:51 ET T: 06/20/2014 17:05:49 ET JOB#: [id number]  cc: Hildred Priest, MD, <Dictator> Rhodia Albright MD ELECTRONICALLY SIGNED 06/27/2014 16:27

## 2015-01-08 ENCOUNTER — Ambulatory Visit (INDEPENDENT_AMBULATORY_CARE_PROVIDER_SITE_OTHER): Payer: 59 | Admitting: Urgent Care

## 2015-01-08 VITALS — BP 112/62 | HR 79 | Temp 97.5°F | Resp 20 | Ht 64.5 in | Wt 197.8 lb

## 2015-01-08 DIAGNOSIS — R51 Headache: Secondary | ICD-10-CM

## 2015-01-08 DIAGNOSIS — E669 Obesity, unspecified: Secondary | ICD-10-CM

## 2015-01-08 DIAGNOSIS — R1084 Generalized abdominal pain: Secondary | ICD-10-CM

## 2015-01-08 DIAGNOSIS — R112 Nausea with vomiting, unspecified: Secondary | ICD-10-CM | POA: Diagnosis not present

## 2015-01-08 DIAGNOSIS — R519 Headache, unspecified: Secondary | ICD-10-CM

## 2015-01-08 DIAGNOSIS — E860 Dehydration: Secondary | ICD-10-CM

## 2015-01-08 DIAGNOSIS — R42 Dizziness and giddiness: Secondary | ICD-10-CM

## 2015-01-08 DIAGNOSIS — M791 Myalgia, unspecified site: Secondary | ICD-10-CM

## 2015-01-08 DIAGNOSIS — B349 Viral infection, unspecified: Secondary | ICD-10-CM

## 2015-01-08 HISTORY — DX: Obesity, unspecified: E66.9

## 2015-01-08 HISTORY — DX: Generalized abdominal pain: R10.84

## 2015-01-08 HISTORY — DX: Dizziness and giddiness: R42

## 2015-01-08 LAB — COMPREHENSIVE METABOLIC PANEL
ALK PHOS: 46 U/L (ref 33–130)
ALT: 27 U/L (ref 6–29)
AST: 33 U/L (ref 10–35)
Albumin: 4.4 g/dL (ref 3.6–5.1)
BILIRUBIN TOTAL: 0.5 mg/dL (ref 0.2–1.2)
BUN: 28 mg/dL — AB (ref 7–25)
CALCIUM: 9.6 mg/dL (ref 8.6–10.4)
CO2: 23 mmol/L (ref 20–31)
Chloride: 104 mmol/L (ref 98–110)
Creat: 1.18 mg/dL — ABNORMAL HIGH (ref 0.50–1.05)
GLUCOSE: 82 mg/dL (ref 65–99)
Potassium: 4.1 mmol/L (ref 3.5–5.3)
Sodium: 138 mmol/L (ref 135–146)
Total Protein: 6.7 g/dL (ref 6.1–8.1)

## 2015-01-08 LAB — POCT CBC
Granulocyte percent: 75.3 %G (ref 37–80)
HCT, POC: 37.9 % (ref 37.7–47.9)
Hemoglobin: 12.3 g/dL (ref 12.2–16.2)
LYMPH, POC: 1.9 (ref 0.6–3.4)
MCH, POC: 27.8 pg (ref 27–31.2)
MCHC: 32.4 g/dL (ref 31.8–35.4)
MCV: 85.8 fL (ref 80–97)
MID (cbc): 0.9 (ref 0–0.9)
MPV: 6.3 fL (ref 0–99.8)
PLATELET COUNT, POC: 307 10*3/uL (ref 142–424)
POC Granulocyte: 8.7 — AB (ref 2–6.9)
POC LYMPH %: 16.7 % (ref 10–50)
POC MID %: 8 %M (ref 0–12)
RBC: 4.42 M/uL (ref 4.04–5.48)
RDW, POC: 12.7 %
WBC: 11.6 10*3/uL — AB (ref 4.6–10.2)

## 2015-01-08 LAB — POCT URINALYSIS DIPSTICK
BILIRUBIN UA: NEGATIVE
Blood, UA: NEGATIVE
Glucose, UA: NEGATIVE
Ketones, UA: NEGATIVE
Nitrite, UA: NEGATIVE
PROTEIN UA: NEGATIVE
Spec Grav, UA: 1.02
Urobilinogen, UA: 0.2
pH, UA: 7

## 2015-01-08 LAB — POCT UA - MICROSCOPIC ONLY
Casts, Ur, LPF, POC: NEGATIVE
Crystals, Ur, HPF, POC: NEGATIVE
MUCUS UA: NEGATIVE
YEAST UA: NEGATIVE

## 2015-01-08 LAB — POCT GLYCOSYLATED HEMOGLOBIN (HGB A1C): HEMOGLOBIN A1C: 5.6

## 2015-01-08 MED ORDER — ONDANSETRON 8 MG PO TBDP: 8.0000 mg | ORAL_TABLET | Freq: Three times a day (TID) | ORAL | Status: DC | PRN

## 2015-01-08 NOTE — Progress Notes (Signed)
MRN: [id number] DOB: Feb 27, 1956  Subjective:   [name] [name] is a 59 y.o. female presenting for chief complaint of Dizziness; Generalized Body Aches; and Headache  Reports 2 day history of malaise, subjective fever, dizziness, myalgia (1 week), intermittent headaches, nausea with vomiting x1, abdominal pain, back pain. She has had a hard time staying hydrated and eating due to her nausea. Has tried [employer], Advil with temporary relief. Denies sore throat, cough, chest pain, shob, wheezing, weakness, numbness and tingling, dysuria, hematuria, diarrhea, bloody stools. Has 1-2 bowel movements daily. Diet is a mix of foods healthy and non-healthy. Admits that she does not hydrate well. Has had a hard time eating as well due to her nausea. No obvious sick contacts but works at [employer] in [place], [place]. Denies smoking, rare alcohol use. Patient is under a lot of stress, has 2 jobs to pay for her mortgage. Takes Zoloft, trazodone PRN for sleep. Denies history of migraines, a. fib, heart disease. Denies any other aggravating or relieving factors, no other questions or concerns.  [name] [name] has a current medication list which includes the following prescription(s): aspirin ec, calcium-vitamin d, fenofibrate, levothyroxine, multivitamin with minerals, omeprazole, sertraline, and trazodone. Also is allergic to morphine and diphenhydramine hcl.  [name] [name]  has a past medical history of DDD (degenerative disc disease), lumbar; Spine malformation; Thyroid disease; GERD (gastroesophageal reflux disease); Irregular heart rate; Hypothyroidism; Depression; Pneumonia; and Sleep apnea. Also  has past surgical history that includes Abdominal hysterectomy; Appendectomy; Tonsillectomy; Back surgery; Craniectomy suboccipital w/ cervical laminectomy / Chiari (08); Carpal tunnel release (Bilateral); and Total hip arthroplasty (Right, 01/12/2013).  Objective:   Vitals: BP 112/62 mmHg  Pulse 79  Temp(Src) 97.5 F (36.4  C) (Oral)  Resp 20  Ht 5' 4.5" (1.638 m)  Wt 197 lb 12.8 oz (89.721 kg)  BMI 33.44 kg/m2  SpO2 98%  Physical Exam  Constitutional: She is oriented to person, place, and time. She appears well-developed and well-nourished.  HENT:  Mouth/Throat: Oropharynx is clear and moist.  Eyes: Conjunctivae and EOM are normal. Pupils are equal, round, and reactive to light. No scleral icterus.  Cardiovascular: Normal rate, regular rhythm and intact distal pulses.  Exam reveals no gallop and no friction rub.   No murmur heard. Pulmonary/Chest: No respiratory distress. She has no wheezes. She has no rales.  Abdominal: Soft. Bowel sounds are normal. She exhibits no distension and no mass. There is tenderness (generalized throughout).  Musculoskeletal: She exhibits no edema or tenderness (diffuse along her back).  Neurological: She is alert and oriented to person, place, and time. No cranial nerve deficit.  Skin: Skin is warm and dry. No rash noted. No erythema. No pallor.   Results for orders placed or performed in visit on 01/08/15 (from the past 24 hour(s))  POCT CBC     Status: Abnormal   Collection Time: 01/08/15 12:20 PM  Result Value Ref Range   WBC 11.6 (A) 4.6 - 10.2 K/uL   Lymph, poc 1.9 0.6 - 3.4   POC LYMPH PERCENT 16.7 10 - 50 %L   MID (cbc) 0.9 0 - 0.9   POC MID % 8.0 0 - 12 %M   POC Granulocyte 8.7 (A) 2 - 6.9   Granulocyte percent 75.3 37 - 80 %G   RBC 4.42 4.04 - 5.48 M/uL   Hemoglobin 12.3 12.2 - 16.2 g/dL   HCT, POC 37.9 37.7 - 47.9 %   MCV 85.8 80 - 97 fL   MCH,  POC 27.8 27 - 31.2 pg   MCHC 32.4 31.8 - 35.4 g/dL   RDW, POC 12.7 %   Platelet Count, POC 307 142 - 424 K/uL   MPV 6.3 0 - 99.8 fL  POCT urinalysis dipstick     Status: Abnormal   Collection Time: 01/08/15 12:20 PM  Result Value Ref Range   Color, UA yellow    Clarity, UA clear    Glucose, UA neg    Bilirubin, UA neg    Ketones, UA neg    Spec Grav, UA 1.020    Blood, UA neg    pH, UA 7.0    Protein, UA  neg    Urobilinogen, UA 0.2    Nitrite, UA neg    Leukocytes, UA Trace (A) Negative  POCT UA - Microscopic Only     Status: None   Collection Time: 01/08/15 12:23 PM  Result Value Ref Range   WBC, Ur, HPF, POC 0-2    RBC, urine, microscopic 0-1    Bacteria, U Microscopic trace    Mucus, UA neg    Epithelial cells, urine per micros 0-2    Crystals, Ur, HPF, POC neg    Casts, Ur, LPF, POC neg    Yeast, UA neg   POCT glycosylated hemoglobin (Hb A1C)     Status: None   Collection Time: 01/08/15  1:22 PM  Result Value Ref Range   Hemoglobin A1C 5.6    Assessment and Plan :   1. Viral syndrome 2. Generalized abdominal pain 3. Non-intractable vomiting with nausea, vomiting of unspecified type - Due to CBC, patient may be undergoing viral syndrome worsened due to dehydration (as below) and exhaustion from her two jobs. Advised supportive care, adequate hydration daily. Zofran for n/v - RTC in 5 days if no improvement. - ondansetron (ZOFRAN-ODT) 8 MG disintegrating tablet; Take 1 tablet (8 mg total) by mouth every 8 (eight) hours as needed for nausea.  Dispense: 15 tablet; Refill: 0  4. Myalgia 5. Nonintractable headache, unspecified chronicity pattern, unspecified headache type 6. Dizziness 7. Dehydration - Patient admits that she does not drink water, does not like it. She has also not eaten well due to her nausea. This may have been the source of her dizziness as it resolved s/p 2L of IV fluids. Same is true to her myalgias.  8. Obesity - Negative for diabetes, ruled this out as a cause of her panpositive ROS. - POCT glycosylated hemoglobin (Hb A1C)   [name] Eagles, PA-C [facility] [phone] 01/08/2015 11:42 AM

## 2015-01-09 LAB — URINE CULTURE: Colony Count: 15000

## 2015-02-16 ENCOUNTER — Telehealth: Payer: Self-pay | Admitting: Urgent Care

## 2015-02-16 NOTE — Telephone Encounter (Signed)
Patient faxed an FMLA/Disabilitiy form to be completed by Jaynee Eagles, PA-C. This is to cover her from the time period of 01/08/2015 to 01/11/2015. Forms placed in box on 02/16/2015. Please return to the FMLA/Disability tray located at checkout upon completion. I also left a message for the patient that she needs to complete a release of information form that we can fax this personal medical information to her employer.

## 2015-02-19 NOTE — Telephone Encounter (Signed)
The form was not in my box this morning. It also was not in my mailbox.

## 2015-02-24 NOTE — Telephone Encounter (Signed)
Forms completed and placed in FMLA box at 102 for processing. Thank you!

## 2015-02-26 NOTE — Telephone Encounter (Signed)
Forms scanned and faxed on 02/26/15

## 2015-03-02 ENCOUNTER — Emergency Department (HOSPITAL_COMMUNITY)
Admission: EM | Admit: 2015-03-02 | Discharge: 2015-03-02 | Disposition: A | Discharge status: Home / Self Care | Payer: 59 | Attending: Emergency Medicine | Admitting: Emergency Medicine

## 2015-03-02 ENCOUNTER — Emergency Department (HOSPITAL_COMMUNITY): Payer: 59

## 2015-03-02 ENCOUNTER — Encounter (HOSPITAL_COMMUNITY): Payer: Self-pay | Admitting: Emergency Medicine

## 2015-03-02 DIAGNOSIS — R079 Chest pain, unspecified: Secondary | ICD-10-CM

## 2015-03-02 DIAGNOSIS — K219 Gastro-esophageal reflux disease without esophagitis: Secondary | ICD-10-CM | POA: Insufficient documentation

## 2015-03-02 DIAGNOSIS — Z8739 Personal history of other diseases of the musculoskeletal system and connective tissue: Secondary | ICD-10-CM | POA: Insufficient documentation

## 2015-03-02 DIAGNOSIS — Z79899 Other long term (current) drug therapy: Secondary | ICD-10-CM | POA: Diagnosis not present

## 2015-03-02 DIAGNOSIS — F329 Major depressive disorder, single episode, unspecified: Secondary | ICD-10-CM | POA: Diagnosis not present

## 2015-03-02 DIAGNOSIS — R000 Tachycardia, unspecified: Secondary | ICD-10-CM | POA: Insufficient documentation

## 2015-03-02 DIAGNOSIS — E039 Hypothyroidism, unspecified: Secondary | ICD-10-CM | POA: Diagnosis not present

## 2015-03-02 DIAGNOSIS — Z8669 Personal history of other diseases of the nervous system and sense organs: Secondary | ICD-10-CM | POA: Insufficient documentation

## 2015-03-02 DIAGNOSIS — Z8701 Personal history of pneumonia (recurrent): Secondary | ICD-10-CM | POA: Insufficient documentation

## 2015-03-02 DIAGNOSIS — Z7982 Long term (current) use of aspirin: Secondary | ICD-10-CM | POA: Insufficient documentation

## 2015-03-02 LAB — CBC
HEMATOCRIT: 38 % (ref 36.0–46.0)
Hemoglobin: 12.4 g/dL (ref 12.0–15.0)
MCH: 28.4 pg (ref 26.0–34.0)
MCHC: 32.6 g/dL (ref 30.0–36.0)
MCV: 87.2 fL (ref 78.0–100.0)
Platelets: 260 10*3/uL (ref 150–400)
RBC: 4.36 MIL/uL (ref 3.87–5.11)
RDW: 13.2 % (ref 11.5–15.5)
WBC: 8.9 10*3/uL (ref 4.0–10.5)

## 2015-03-02 LAB — BASIC METABOLIC PANEL
Anion gap: 10 (ref 5–15)
BUN: 22 mg/dL — ABNORMAL HIGH (ref 6–20)
CALCIUM: 9.9 mg/dL (ref 8.9–10.3)
CO2: 19 mmol/L — AB (ref 22–32)
Chloride: 110 mmol/L (ref 101–111)
Creatinine, Ser: 1.01 mg/dL — ABNORMAL HIGH (ref 0.44–1.00)
GFR calc Af Amer: >60 mL/min (ref >60)
GFR calc non Af Amer: >60 mL/min (ref >60)
GLUCOSE: 112 mg/dL — AB (ref 65–99)
Potassium: 4.4 mmol/L (ref 3.5–5.1)
Sodium: 139 mmol/L (ref 135–145)

## 2015-03-02 LAB — I-STAT TROPONIN, ED: TROPONIN I, POC: 0 ng/mL (ref 0.00–0.08)

## 2015-03-02 NOTE — Discharge Instructions (Signed)
Return to the ED with any concerns including difficulty breathing, fainting, worsening chest pain, fainting, decreased level of alertness/lethargy, or any other alarming symptoms

## 2015-03-02 NOTE — ED Provider Notes (Signed)
CSN: [id number]     Arrival date & time 03/02/15  1259 History   First MD Initiated Contact with Patient 03/02/15 1308     Chief Complaint  Patient presents with  . Chest Pain     (Consider location/radiation/quality/duration/timing/severity/associated sxs/prior Treatment) HPI  Pt presenting with c/o central chest pain which occurred at her job this morning.  She states she was in an argument on the phone with her boyfriend when pain occurred.  She states she was breathing very fast,  Pain was sharp and feels sore- more sore to the touch.  She states she has been under increased stress with problems in her relationship.  Pt c/o shortness of breath, appears to be hyperventilating when talking about her stresses.  No nasuea, no diaphoresis, no radiation of pain.  No syncope.  Pt currently has no active chest pain.  No leg swelling.  No hx of DVT/PE, no recent travel/trauma/surgery.  She received one nitroglycerin via EMS prior to arrival.  There are no other associated systemic symptoms, there are no other alleviating or modifying factors.   Past Medical History  Diagnosis Date  . DDD (degenerative disc disease), lumbar   . Spine malformation   . Thyroid disease   . GERD (gastroesophageal reflux disease)   . Irregular heart rate   . Hypothyroidism   . Depression   . Pneumonia     hx  . Sleep apnea     unable to wear mask-sleep study 3-4 yrs ago   Past Surgical History  Procedure Laterality Date  . Abdominal hysterectomy    . Appendectomy    . Tonsillectomy    . Back surgery      l4 and l5  . Craniectomy suboccipital w/ cervical laminectomy / chiari  08  . Carpal tunnel release Bilateral   . Total hip arthroplasty Right 01/12/2013    Procedure: TOTAL HIP ARTHROPLASTY WITH AUTOGRAFT;  Surgeon: Ninetta Lights, MD;  Location: [facility];  Service: Orthopedics;  Laterality: Right;   Family History  Problem Relation Age of Onset  . COPD Mother   . Stroke Mother   . Cancer Father     Social History  Substance Use Topics  . Smoking status: Never Smoker   . Smokeless tobacco: Never Used     Comment: 2004 last crack   occ alcohol  . Alcohol Use: No     Comment: 2 beers a year   OB History    No data available     Review of Systems  ROS reviewed and all otherwise negative except for mentioned in HPI    Allergies  Morphine and Diphenhydramine hcl  Home Medications   Prior to Admission medications   Medication Sig Start Date End Date Taking? Authorizing Provider  aspirin EC 81 MG tablet Take 81 mg by mouth daily.    Historical Provider, MD  calcium-vitamin D (OSCAL WITH D) 500-200 MG-UNIT per tablet Take 1 tablet by mouth daily with breakfast.    Historical Provider, MD  fenofibrate 160 MG tablet Take 160 mg by mouth daily.      Historical Provider, MD  levothyroxine (SYNTHROID, LEVOTHROID) 125 MCG tablet Take 125 mcg by mouth daily before breakfast.    Historical Provider, MD  Multiple Vitamin (MULTIVITAMIN WITH MINERALS) TABS tablet Take 1 tablet by mouth daily.    Historical Provider, MD  omeprazole (PRILOSEC) 20 MG capsule Take 20 mg by mouth daily.      Historical Provider, MD  ondansetron (ZOFRAN-ODT)  8 MG disintegrating tablet Take 1 tablet (8 mg total) by mouth every 8 (eight) hours as needed for nausea. 01/08/15   Jaynee Eagles, PA-C  sertraline (ZOLOFT) 100 MG tablet Take 100 mg by mouth at bedtime.    Historical Provider, MD  traZODone (DESYREL) 100 MG tablet Take 100 mg by mouth at bedtime.    Historical Provider, MD   BP 99/58 mmHg  Pulse 72  Temp(Src) 98.5 F (36.9 C) (Oral)  Resp 24  Ht 5' 5.38" (1.661 m)  Wt 191 lb (86.637 kg)  BMI 31.40 kg/m2  SpO2 100%  Vitals reviewed Physical Exam  Physical Examination: General appearance - alert, well appearing, and in no distress Mental status - alert, oriented to person, place, and time Eyes - no scleral icterus, no conjunctival injection Mouth - mucous membranes moist, pharynx normal without  lesions Neck - supple, no significant adenopathy Chest - clear to auscultation, no wheezes, rales or rhonchi, symmetric air entry, tender to palpation over sternum at area of chest pain, no crepitus Heart - normal rate, regular rhythm, normal S1, S2, no murmurs, rubs, clicks or gallops Abdomen - soft, nontender, nondistended, no masses or organomegaly Neurological - alert, oriented, normal speech Musculoskeletal - no joint tenderness, deformity or swelling Extremities - peripheral pulses normal, no pedal edema, no clubbing or cyanosis Skin - normal coloration and turgor, no rashes Psych- anxious, talkative, intermittently tearful  ED Course  Procedures (including critical care time) Labs Review Labs Reviewed  BASIC METABOLIC PANEL - Abnormal; Notable for the following:    CO2 19 (*)    Glucose, Bld 112 (*)    BUN 22 (*)    Creatinine, Ser 1.01 (*)    All other components within normal limits  CBC  I-STAT TROPOININ, ED    Imaging Review Dg Chest 2 View  03/02/2015  CLINICAL DATA:  Chest pain off and on for 1 week worse today with pain down back of LEFT arm, shortness of breath and lightheadedness EXAM: CHEST  2 VIEW COMPARISON:  09/08/2014 FINDINGS: Enlargement of cardiac silhouette. Mediastinal contours and pulmonary vascularity normal. Subsegmental atelectasis LEFT base. Lungs otherwise clear. No pleural effusion or pneumothorax. Osseous structures unremarkable. IMPRESSION: Enlargement of cardiac silhouette with mild subsegmental atelectasis LEFT base. Electronically Signed   By: Lavonia Dana M.D.   On: 03/02/2015 14:02   I have personally reviewed and evaluated these images and lab results as part of my medical decision-making.   EKG Interpretation   Date/Time:  Friday March 02 2015 13:27:28 EDT Ventricular Rate:  78 PR Interval:  189 QRS Duration: 94 QT Interval:  428 QTC Calculation: 487 R Axis:   41 Text Interpretation:  Sinus rhythm Ventricular premature complex Low   voltage, precordial leads RSR' in V1 or V2, right VCD or RVH Borderline  prolonged QT interval Since previous tracing PVC is new Confirmed by  [facility]  MD, [place] [phone]) on 03/02/2015 1:37:41 PM      MDM   Final diagnoses:  Chest pain, unspecified chest pain type    Pt presenting with c/o chest pain which came on while she was very anxious and upset during a telephone argument.  States this has been happening intermittnetly over the past week when she becomes upset with boyfriend.  Workup is reassuring in the ED including no changes from prior EKG, negative troponin.  Pt has no risk factors for PE.  No findings of PNA, PTX on CXR.  Suspect symptoms related to anxiety.  D/w patient  the importance of outpatient followup.  Discharged with strict return precautions.  Pt agreeable with plan.    Alfonzo Beers, MD 03/03/15 [phone]

## 2015-03-02 NOTE — ED Notes (Signed)
Sudden onset cp central radiate to left arm while at work.  States that the pain in arm and chest is worse when you touch it.  Has no cardiac history.. Does not remember recent injury.  Has been under stress lately, c/o sob but it hyperventilating and lightheadedness.  1st ems ecg showed inverted t waves v1-v4, a posterior 12 lead ecg was unremarkable.  BP: 104/70, was given 1 ntg.  98/54 after ntg.  Was given 324 of asa. No nausea/vomiting.

## 2015-03-02 NOTE — ED Notes (Signed)
Intermittent cp over past week.

## 2015-05-01 ENCOUNTER — Emergency Department: Payer: 59

## 2015-05-01 ENCOUNTER — Inpatient Hospital Stay
Admission: EM | Admit: 2015-05-01 | Discharge: 2015-05-05 | DRG: 392 | Disposition: A | Discharge status: Home / Self Care | Payer: 59 | Attending: Surgery | Admitting: Surgery

## 2015-05-01 ENCOUNTER — Encounter: Payer: Self-pay | Admitting: Medical Oncology

## 2015-05-01 DIAGNOSIS — N390 Urinary tract infection, site not specified: Secondary | ICD-10-CM | POA: Diagnosis present

## 2015-05-01 DIAGNOSIS — K529 Noninfective gastroenteritis and colitis, unspecified: Secondary | ICD-10-CM | POA: Diagnosis present

## 2015-05-01 DIAGNOSIS — E039 Hypothyroidism, unspecified: Secondary | ICD-10-CM | POA: Diagnosis present

## 2015-05-01 DIAGNOSIS — Z96641 Presence of right artificial hip joint: Secondary | ICD-10-CM | POA: Diagnosis present

## 2015-05-01 DIAGNOSIS — Z809 Family history of malignant neoplasm, unspecified: Secondary | ICD-10-CM

## 2015-05-01 DIAGNOSIS — K219 Gastro-esophageal reflux disease without esophagitis: Secondary | ICD-10-CM | POA: Diagnosis present

## 2015-05-01 DIAGNOSIS — Z825 Family history of asthma and other chronic lower respiratory diseases: Secondary | ICD-10-CM

## 2015-05-01 DIAGNOSIS — G4730 Sleep apnea, unspecified: Secondary | ICD-10-CM | POA: Diagnosis present

## 2015-05-01 DIAGNOSIS — F329 Major depressive disorder, single episode, unspecified: Secondary | ICD-10-CM | POA: Diagnosis present

## 2015-05-01 DIAGNOSIS — Z823 Family history of stroke: Secondary | ICD-10-CM

## 2015-05-01 DIAGNOSIS — K5792 Diverticulitis of intestine, part unspecified, without perforation or abscess without bleeding: Principal | ICD-10-CM | POA: Diagnosis present

## 2015-05-01 DIAGNOSIS — F419 Anxiety disorder, unspecified: Secondary | ICD-10-CM | POA: Diagnosis present

## 2015-05-01 LAB — URINALYSIS COMPLETE WITH MICROSCOPIC (ARMC ONLY)
BILIRUBIN URINE: NEGATIVE
Glucose, UA: NEGATIVE mg/dL
KETONES UR: NEGATIVE mg/dL
NITRITE: NEGATIVE
PROTEIN: NEGATIVE mg/dL
SPECIFIC GRAVITY, URINE: 1.02 (ref 1.005–1.030)
pH: 5 (ref 5.0–8.0)

## 2015-05-01 LAB — COMPREHENSIVE METABOLIC PANEL
ALT: 25 U/L (ref 14–54)
AST: 24 U/L (ref 15–41)
Albumin: 4.2 g/dL (ref 3.5–5.0)
Alkaline Phosphatase: 59 U/L (ref 38–126)
Anion gap: 8 (ref 5–15)
BUN: 16 mg/dL (ref 6–20)
CALCIUM: 9.5 mg/dL (ref 8.9–10.3)
CHLORIDE: 106 mmol/L (ref 101–111)
CO2: 23 mmol/L (ref 22–32)
CREATININE: 0.9 mg/dL (ref 0.44–1.00)
GFR calc Af Amer: >60 mL/min (ref >60)
GFR calc non Af Amer: >60 mL/min (ref >60)
GLUCOSE: 122 mg/dL — AB (ref 65–99)
Potassium: 3.9 mmol/L (ref 3.5–5.1)
SODIUM: 137 mmol/L (ref 135–145)
Total Bilirubin: 0.8 mg/dL (ref 0.3–1.2)
Total Protein: 7.6 g/dL (ref 6.5–8.1)

## 2015-05-01 LAB — CBC WITH DIFFERENTIAL/PLATELET
BASOS PCT: 0 %
Basophils Absolute: 0.1 10*3/uL (ref 0–0.1)
EOS ABS: 0.1 10*3/uL (ref 0–0.7)
EOS PCT: 0 %
HCT: 40.6 % (ref 35.0–47.0)
HEMOGLOBIN: 13.3 g/dL (ref 12.0–16.0)
Lymphocytes Relative: 9 %
Lymphs Abs: 1.6 10*3/uL (ref 1.0–3.6)
MCH: 28.1 pg (ref 26.0–34.0)
MCHC: 32.7 g/dL (ref 32.0–36.0)
MCV: 86 fL (ref 80.0–100.0)
Monocytes Absolute: 1.4 10*3/uL — ABNORMAL HIGH (ref 0.2–0.9)
Monocytes Relative: 8 %
NEUTROS PCT: 83 %
Neutro Abs: 14.7 10*3/uL — ABNORMAL HIGH (ref 1.4–6.5)
PLATELETS: 331 10*3/uL (ref 150–440)
RBC: 4.73 MIL/uL (ref 3.80–5.20)
RDW: 12.9 % (ref 11.5–14.5)
WBC: 17.8 10*3/uL — AB (ref 3.6–11.0)

## 2015-05-01 LAB — CREATININE, SERUM
Creatinine, Ser: 0.98 mg/dL (ref 0.44–1.00)
GFR calc Af Amer: >60 mL/min (ref >60)
GFR calc non Af Amer: >60 mL/min (ref >60)

## 2015-05-01 LAB — CBC
HEMATOCRIT: 39 % (ref 35.0–47.0)
HEMOGLOBIN: 12.6 g/dL (ref 12.0–16.0)
MCH: 28.2 pg (ref 26.0–34.0)
MCHC: 32.3 g/dL (ref 32.0–36.0)
MCV: 87.3 fL (ref 80.0–100.0)
Platelets: 311 10*3/uL (ref 150–440)
RBC: 4.47 MIL/uL (ref 3.80–5.20)
RDW: 12.7 % (ref 11.5–14.5)
WBC: 16 10*3/uL — ABNORMAL HIGH (ref 3.6–11.0)

## 2015-05-01 LAB — LIPASE, BLOOD: Lipase: 16 U/L (ref 11–51)

## 2015-05-01 LAB — LACTIC ACID, PLASMA: Lactic Acid, Venous: 0.8 mmol/L (ref 0.5–2.0)

## 2015-05-01 LAB — TROPONIN I: Troponin I: <0.03 ng/mL (ref <0.031)

## 2015-05-01 MED ORDER — ONDANSETRON HCL 4 MG/2ML IJ SOLN
4.0000 mg | Freq: Four times a day (QID) | INTRAMUSCULAR | Status: DC | PRN
  Administered 2015-05-03 (×2): 4 mg via INTRAVENOUS
  Filled 2015-05-01 (×2): qty 2

## 2015-05-01 MED ORDER — DULOXETINE HCL 20 MG PO CPEP
40.0000 mg | ORAL_CAPSULE | Freq: Every day | ORAL | Status: DC
  Administered 2015-05-01 – 2015-05-05 (×5): 40 mg via ORAL
  Filled 2015-05-01 (×5): qty 2

## 2015-05-01 MED ORDER — ADULT MULTIVITAMIN W/MINERALS CH
1.0000 | ORAL_TABLET | Freq: Every day | ORAL | Status: DC
  Administered 2015-05-01 – 2015-05-05 (×5): 1 via ORAL
  Filled 2015-05-01 (×5): qty 1

## 2015-05-01 MED ORDER — ENOXAPARIN SODIUM 40 MG/0.4ML ~~LOC~~ SOLN
40.0000 mg | SUBCUTANEOUS | Status: DC
Start: 2015-05-01 — End: 2015-05-04
  Administered 2015-05-01 – 2015-05-03 (×3): 40 mg via SUBCUTANEOUS
  Filled 2015-05-01 (×3): qty 0.4

## 2015-05-01 MED ORDER — ASPIRIN EC 81 MG PO TBEC
81.0000 mg | DELAYED_RELEASE_TABLET | Freq: Every day | ORAL | Status: DC
  Administered 2015-05-01 – 2015-05-03 (×3): 81 mg via ORAL
  Filled 2015-05-01 (×3): qty 1

## 2015-05-01 MED ORDER — ONDANSETRON HCL 4 MG PO TABS: 4.0000 mg | ORAL_TABLET | Freq: Four times a day (QID) | ORAL | Status: DC | PRN

## 2015-05-01 MED ORDER — CALCIUM CARBONATE-VITAMIN D 500-200 MG-UNIT PO TABS
1.0000 | ORAL_TABLET | Freq: Every day | ORAL | Status: DC
  Administered 2015-05-02 – 2015-05-05 (×4): 1 via ORAL
  Filled 2015-05-01 (×4): qty 1

## 2015-05-01 MED ORDER — SODIUM CHLORIDE 0.9 % IV BOLUS (SEPSIS)
1000.0000 mL | Freq: Once | INTRAVENOUS | Status: AC
  Administered 2015-05-01: 1000 mL via INTRAVENOUS

## 2015-05-01 MED ORDER — HYDROMORPHONE HCL 1 MG/ML IJ SOLN
0.5000 mg | Freq: Once | INTRAMUSCULAR | Status: AC
  Administered 2015-05-01: 0.5 mg via INTRAVENOUS
  Filled 2015-05-01: qty 1

## 2015-05-01 MED ORDER — DEXTROSE 5 % IV SOLN
1.0000 g | INTRAVENOUS | Status: DC
  Administered 2015-05-02: 1 g via INTRAVENOUS
  Filled 2015-05-01: qty 10

## 2015-05-01 MED ORDER — LEVOTHYROXINE SODIUM 125 MCG PO TABS
125.0000 ug | ORAL_TABLET | Freq: Every day | ORAL | Status: DC
  Administered 2015-05-02 – 2015-05-05 (×4): 125 ug via ORAL
  Filled 2015-05-01 (×5): qty 1

## 2015-05-01 MED ORDER — ALUM & MAG HYDROXIDE-SIMETH 200-200-20 MG/5ML PO SUSP: 30.0000 mL | Freq: Four times a day (QID) | ORAL | Status: DC | PRN

## 2015-05-01 MED ORDER — ACETAMINOPHEN 325 MG PO TABS: 650.0000 mg | ORAL_TABLET | Freq: Four times a day (QID) | ORAL | Status: DC | PRN

## 2015-05-01 MED ORDER — METRONIDAZOLE IN NACL 5-0.79 MG/ML-% IV SOLN
500.0000 mg | Freq: Once | INTRAVENOUS | Status: AC
  Administered 2015-05-01: 500 mg via INTRAVENOUS
  Filled 2015-05-01: qty 100

## 2015-05-01 MED ORDER — OXYCODONE-ACETAMINOPHEN 5-325 MG PO TABS
1.0000 | ORAL_TABLET | Freq: Four times a day (QID) | ORAL | Status: DC | PRN
  Administered 2015-05-01 – 2015-05-04 (×11): 1 via ORAL
  Filled 2015-05-01 (×12): qty 1

## 2015-05-01 MED ORDER — PANTOPRAZOLE SODIUM 40 MG PO TBEC
40.0000 mg | DELAYED_RELEASE_TABLET | Freq: Every day | ORAL | Status: DC
  Administered 2015-05-01 – 2015-05-05 (×5): 40 mg via ORAL
  Filled 2015-05-01 (×5): qty 1

## 2015-05-01 MED ORDER — HYDROMORPHONE HCL 1 MG/ML IJ SOLN
INTRAMUSCULAR | Status: AC
  Administered 2015-05-01: 0.5 mg via INTRAVENOUS
  Filled 2015-05-01: qty 1

## 2015-05-01 MED ORDER — METRONIDAZOLE IN NACL 5-0.79 MG/ML-% IV SOLN
500.0000 mg | Freq: Three times a day (TID) | INTRAVENOUS | Status: DC
Start: 2015-05-01 — End: 2015-05-05
  Administered 2015-05-01 – 2015-05-05 (×11): 500 mg via INTRAVENOUS
  Filled 2015-05-01 (×14): qty 100

## 2015-05-01 MED ORDER — IOHEXOL 350 MG/ML SOLN
100.0000 mL | Freq: Once | INTRAVENOUS | Status: AC | PRN
  Administered 2015-05-01: 100 mL via INTRAVENOUS

## 2015-05-01 MED ORDER — IOHEXOL 240 MG/ML SOLN
25.0000 mL | Freq: Once | INTRAMUSCULAR | Status: AC | PRN
  Administered 2015-05-01: 25 mL via ORAL

## 2015-05-01 MED ORDER — ACETAMINOPHEN 650 MG RE SUPP: 650.0000 mg | Freq: Four times a day (QID) | RECTAL | Status: DC | PRN

## 2015-05-01 MED ORDER — SENNOSIDES-DOCUSATE SODIUM 8.6-50 MG PO TABS
1.0000 | ORAL_TABLET | Freq: Every evening | ORAL | Status: DC | PRN
  Administered 2015-05-05: 09:00:00 1 via ORAL
  Filled 2015-05-01 (×2): qty 1

## 2015-05-01 MED ORDER — ONDANSETRON HCL 4 MG/2ML IJ SOLN
4.0000 mg | Freq: Once | INTRAMUSCULAR | Status: AC
  Administered 2015-05-01: 4 mg via INTRAVENOUS
  Filled 2015-05-01: qty 2

## 2015-05-01 MED ORDER — FENOFIBRATE 160 MG PO TABS
160.0000 mg | ORAL_TABLET | Freq: Every day | ORAL | Status: DC
  Administered 2015-05-01 – 2015-05-05 (×5): 160 mg via ORAL
  Filled 2015-05-01 (×5): qty 1

## 2015-05-01 MED ORDER — SODIUM CHLORIDE 0.9 % IV SOLN
INTRAVENOUS | Status: DC
  Administered 2015-05-01 – 2015-05-05 (×9): via INTRAVENOUS

## 2015-05-01 MED ORDER — DEXTROSE 5 % IV SOLN
1.0000 g | Freq: Once | INTRAVENOUS | Status: AC
  Administered 2015-05-01: 1 g via INTRAVENOUS
  Filled 2015-05-01: qty 10

## 2015-05-01 MED ORDER — HYDROMORPHONE HCL 1 MG/ML IJ SOLN
0.5000 mg | Freq: Once | INTRAMUSCULAR | Status: AC
  Administered 2015-05-01: 0.5 mg via INTRAVENOUS

## 2015-05-01 NOTE — H&P (Signed)
[facility] NAME: [name] [name]    MR#:  [id number]  DATE OF BIRTH:  07-14-1955  DATE OF ADMISSION:  05/01/2015  PRIMARY CARE PHYSICIAN: Reginia Naas, MD   REQUESTING/REFERRING PHYSICIAN: Dr. Reita Cliche  CHIEF COMPLAINT:  Left lower quadrant abdominal pain HISTORY OF PRESENT ILLNESS:  [name] [name]  is a 59 y.o. female with a known history of  hypothyroidism and depression who presents with above complaint. Patient states about 2 days ago she has had left lower quadrant abdominal pain which does not radiate to her groin. She does have some back pain associated with it as well. She denies fever or chills. In the emergency room CT scan was performed which showed colitis versus acute diverticulitis. SHe has been started on Rocephin and Flagyl. She also has UTI.  PAST MEDICAL HISTORY:   Past Medical History  Diagnosis Date  . DDD (degenerative disc disease), lumbar   . Spine malformation   . Thyroid disease   . GERD (gastroesophageal reflux disease)   . Irregular heart rate   . Hypothyroidism   . Depression   . Pneumonia     hx  . Sleep apnea     unable to wear mask-sleep study 3-4 yrs ago    PAST SURGICAL HISTORY:   Past Surgical History  Procedure Laterality Date  . Abdominal hysterectomy    . Appendectomy    . Tonsillectomy    . Back surgery      l4 and l5  . Craniectomy suboccipital w/ cervical laminectomy / chiari  08  . Carpal tunnel release Bilateral   . Total hip arthroplasty Right 01/12/2013    Procedure: TOTAL HIP ARTHROPLASTY WITH AUTOGRAFT;  Surgeon: Ninetta Lights, MD;  Location: [facility];  Service: Orthopedics;  Laterality: Right;    SOCIAL HISTORY:   Social History  Substance Use Topics  . Smoking status: Never Smoker   . Smokeless tobacco: Never Used     Comment: 2004 last crack   occ alcohol  . Alcohol Use: No     Comment: 2 beers a year    FAMILY HISTORY:   Family History  Problem  Relation Age of Onset  . COPD Mother   . Stroke Mother   . Cancer Father     DRUG ALLERGIES:   Allergies  Allergen Reactions  . Morphine     REACTION: Bradycardia, Hypotension  . Diphenhydramine Hcl Palpitations     REVIEW OF SYSTEMS:  CONSTITUTIONAL: No fever, fatigue or weakness.  EYES: No blurred or double vision.  EARS, NOSE, AND THROAT: No tinnitus or ear pain.  RESPIRATORY: No cough, shortness of breath, wheezing or hemoptysis.  CARDIOVASCULAR: No chest pain, orthopnea, edema.  GASTROINTESTINAL: No nausea, vomiting, diarrhea   +++ LLQ abdominal pain.  GENITOURINARY: No dysuria, hematuria.  ENDOCRINE: No polyuria, nocturia,  HEMATOLOGY: No anemia, easy bruising or bleeding SKIN: No rash or lesion. MUSCULOSKELETAL: No joint pain or arthritis.   NEUROLOGIC: No tingling, numbness, weakness.  PSYCHIATRY: + anxiety/ depression.   MEDICATIONS AT HOME:   Prior to Admission medications   Medication Sig Start Date End Date Taking? Authorizing Provider  aspirin EC 81 MG tablet Take 81 mg by mouth daily.   Yes Historical Provider, MD  calcium-vitamin D (OSCAL WITH D) 500-200 MG-UNIT per tablet Take 1 tablet by mouth daily with breakfast.   Yes Historical Provider, MD  DULoxetine (CYMBALTA) 20 MG capsule Take 40 mg by mouth daily.  Yes Historical Provider, MD  fenofibrate 160 MG tablet Take 160 mg by mouth daily.     Yes Historical Provider, MD  levothyroxine (SYNTHROID, LEVOTHROID) 125 MCG tablet Take 125 mcg by mouth daily before breakfast.   Yes Historical Provider, MD  Multiple Vitamin (MULTIVITAMIN WITH MINERALS) TABS tablet Take 1 tablet by mouth daily.   Yes Historical Provider, MD  omeprazole (PRILOSEC) 20 MG capsule Take 20 mg by mouth daily.     Yes Historical Provider, MD  traZODone (DESYREL) 100 MG tablet Take 100 mg by mouth at bedtime as needed.    Yes Historical Provider, MD      VITAL SIGNS:  Blood pressure 101/77, pulse 96, resp. rate 18, height 5\' 6"  (1.676  m), weight 91.173 kg (201 lb), SpO2 97 %.  PHYSICAL EXAMINATION:  GENERAL:  59 y.o.-year-old patient lying in the bed with no acute distress.  EYES: Pupils equal, round, reactive to light and accommodation. No scleral icterus. Extraocular muscles intact.  HEENT: Head atraumatic, normocephalic. Oropharynx and nasopharynx clear.  NECK:  Supple, no jugular venous distention. No thyroid enlargement, no tenderness.  LUNGS: Normal breath sounds bilaterally, no wheezing, rales,rhonchi or crepitation. No use of accessory muscles of respiration.  CARDIOVASCULAR: S1, S2 normal. No murmurs, rubs, or gallops.  ABDOMEN: left lower quadrant tenderness without rebound or guarding. Positive bowel sounds. Hard to appreciate organomegaly due to body habitus.  EXTREMITIES: No pedal edema, cyanosis, or clubbing.  NEUROLOGIC: Cranial nerves II through XII are grossly intact. No focal deficits. PSYCHIATRIC: The patient is alert and oriented x 3.  SKIN: No obvious rash, lesion, or ulcer.   LABORATORY PANEL:   CBC  Recent Labs Lab 05/01/15 0903  WBC 17.8*  HGB 13.3  HCT 40.6  PLT 331   ------------------------------------------------------------------------------------------------------------------  Chemistries   Recent Labs Lab 05/01/15 0903  NA 137  K 3.9  CL 106  CO2 23  GLUCOSE 122*  BUN 16  CREATININE 0.90  CALCIUM 9.5  AST 24  ALT 25  ALKPHOS 59  BILITOT 0.8   ------------------------------------------------------------------------------------------------------------------  Cardiac Enzymes  Recent Labs Lab 05/01/15 0903  TROPONINI <0.03   ------------------------------------------------------------------------------------------------------------------  RADIOLOGY:  Ct Abdomen Pelvis W Contrast  05/01/2015  CLINICAL DATA:  Left side flank pain for 2 days EXAM: CT ABDOMEN AND PELVIS WITH CONTRAST TECHNIQUE: Multidetector CT imaging of the abdomen and pelvis was performed using  the standard protocol following bolus administration of intravenous contrast. CONTRAST:  142mL OMNIPAQUE IOHEXOL 350 MG/ML SOLN COMPARISON:  None. FINDINGS: The lung bases are unremarkable. Sagittal images of the spine shows mild degenerative changes lower thoracic spine. There is posterior fusion lumbar spine at L4-L5 level. Disc space flattening with vacuum disc phenomena noted at L3-L4 and L5-S1 level. Enhanced liver is unremarkable no calcified gallstones are noted within gallbladder. No focal hepatic mass. The pancreas, spleen and adrenal glands are unremarkable. Enhanced kidneys are symmetrical size. No hydronephrosis or hydroureter. Delayed renal images shows bilateral renal symmetrical excretion. Bilateral visualized proximal ureter is unremarkable. No small bowel obstruction.  No free abdominal air. No pericecal inflammation. The terminal ileum is unremarkable. No adenopathy. In axial image 46 there is short segment thickening of descending colon wall in left flank. There is significant stranding of pericolonic fat. Colonic diverticula are noted at this level. Trace amount of fluid/ stranding noted in left paracolic gutter. There is mild enhancement of diverticular wall in axial image 49 at this level. Findings are consistent with focal colitis or acute diverticulitis. There is  no evidence of pericolonic abscess or definite perforation. No extraluminal contrast material is noted. Moderate stool noted in rectosigmoid colon. The patient is status post hysterectomy. Urinary bladder is unremarkable. Degenerative changes pubic symphysis. Evaluation of the pelvis is limited by metallic artifacts from right hip prosthesis. IMPRESSION: 1. There is inflammatory process in left flank descending colon with focal thickening of colonic wall and significant stranding of pericolonic fat. Mild enhancement of colonic diverticular wall in axial image 49. Findings are consistent with focal colitis or acute diverticulitis. No  definite evidence of perforation or pericolonic abscess. Trace amount of fluid/stranding noted in left paracolic gutter. 2. No small bowel or colonic obstruction. 3. Moderate stool noted in rectosigmoid colon. 4. Status post appendectomy.  No pericecal inflammation. 5. Postsurgical changes lumbar spine L4-L5 level. 6. Status post hysterectomy. Electronically Signed   By: Lahoma Crocker M.D.   On: 05/01/2015 11:13    EKG:   Sinus tachycardia no ST elevation or depression   IMPRESSION AND PLAN:   59 year old female with a history of hypothyroidism and depression who presents with abdominal pain and found to have colitis versus acute diverticulitis on CT scan.   1. Abdominal pain: I suspect this is more related acute diverticulitis. I will order Rocephin and Flagyl. We'll also consult surgery. Continue pain medications as tolerated.   2. Urinary tract infection: Patient's on Rocephin. Urine culture was ordered in the emergency room.  3. Hypothyroidism: Continue Synthroid   4. Depression/anxiety: Continue Cymbalta    All the records are reviewed and case discussed with ED provider. Management plans discussed with the patient and she is in agreement.  CODE STATUS: FULL  TOTAL TIME TAKING CARE OF THIS PATIENT: 50 minutes.    Jatavious Peppard M.D on 05/01/2015 at 12:18 PM  Between 7am to 6pm - Pager - [phone] After 6pm go to [email or web address] - password [email or web address] [facility]  Office  [phone]  CC: Primary care physician; Reginia Naas, MD

## 2015-05-01 NOTE — ED Notes (Signed)
Pt reports she has been having left sided flank pain that radiates into lower abd x 2 days. Denies dysuria. Reports nausea but no vomiting.

## 2015-05-01 NOTE — ED Provider Notes (Signed)
[facility] Emergency Department Provider Note   ____________________________________________  Time seen: 8:15 AM I have reviewed the triage vital signs and the triage nursing note.  HISTORY  Chief Complaint Flank Pain and Abdominal Pain   Historian Patient  HPI [name] [name] is a 59 y.o. female here for evaluation of left-sided abdominal pain that extends down into the suprapubic area.  It started about 2 days ago and has gradually gotten worse and worse. Pain is constant. Pain is moderate to severe. Mild nausea, no vomiting. patient diarrhea history. No fever. No chest pain or trouble breathing. No history of panic this before. No exacerbating or alleviating factors. Denies dysuria or frequency or urination.    Past Medical History  Diagnosis Date  . DDD (degenerative disc disease), lumbar   . Spine malformation   . Thyroid disease   . GERD (gastroesophageal reflux disease)   . Irregular heart rate   . Hypothyroidism   . Depression   . Pneumonia     hx  . Sleep apnea     unable to wear mask-sleep study 3-4 yrs ago    Patient Active Problem List   Diagnosis Date Noted  . Generalized abdominal pain 01/08/2015  . Obesity 01/08/2015  . Dizziness 01/08/2015  . HYPOTHYROIDISM 11/07/2009  . CALLUSES, LEFT FOOT 11/07/2009  . FASCIITIS, PLANTAR 11/07/2009    Past Surgical History  Procedure Laterality Date  . Abdominal hysterectomy    . Appendectomy    . Tonsillectomy    . Back surgery      l4 and l5  . Craniectomy suboccipital w/ cervical laminectomy / chiari  08  . Carpal tunnel release Bilateral   . Total hip arthroplasty Right 01/12/2013    Procedure: TOTAL HIP ARTHROPLASTY WITH AUTOGRAFT;  Surgeon: Ninetta Lights, MD;  Location: [place];  Service: Orthopedics;  Laterality: Right;    Current Outpatient Rx  Name  Route  Sig  Dispense  Refill  . aspirin EC 81 MG tablet   Oral   Take 81 mg by mouth daily.         . calcium-vitamin D  (OSCAL WITH D) 500-200 MG-UNIT per tablet   Oral   Take 1 tablet by mouth daily with breakfast.         . DULoxetine (CYMBALTA) 20 MG capsule   Oral   Take 40 mg by mouth daily.         . fenofibrate 160 MG tablet   Oral   Take 160 mg by mouth daily.           Marland Kitchen levothyroxine (SYNTHROID, LEVOTHROID) 125 MCG tablet   Oral   Take 125 mcg by mouth daily before breakfast.         . Multiple Vitamin (MULTIVITAMIN WITH MINERALS) TABS tablet   Oral   Take 1 tablet by mouth daily.         Marland Kitchen omeprazole (PRILOSEC) 20 MG capsule   Oral   Take 20 mg by mouth daily.           . traZODone (DESYREL) 100 MG tablet   Oral   Take 100 mg by mouth at bedtime as needed.            Allergies Morphine and Diphenhydramine hcl  Family History  Problem Relation Age of Onset  . COPD Mother   . Stroke Mother   . Cancer Father     Social History Social History  Substance Use Topics  .  Smoking status: Never Smoker   . Smokeless tobacco: Never Used     Comment: 2004 last crack   occ alcohol  . Alcohol Use: No     Comment: 2 beers a year    Review of Systems  Constitutional: Negative for fever. Eyes: Negative for visual changes. ENT: Negative for sore throat. Cardiovascular: Negative for chest pain. Respiratory: Negative for shortness of breath. Gastrointestinal: Positive as per history of present illness  Genitourinary: Negative for dysuria. Musculoskeletal: Negative for back pain. Skin: Negative for rash. Neurological: Negative for headache. 10 point Review of Systems otherwise negative ____________________________________________   PHYSICAL EXAM:  VITAL SIGNS: ED Triage Vitals  Enc Vitals Group     BP 05/01/15 0804 112/71 mmHg     Pulse Rate 05/01/15 0804 108     Resp 05/01/15 0804 20     Temp --      Temp src --      SpO2 05/01/15 0804 98 %     Weight 05/01/15 0804 201 lb (91.173 kg)     Height 05/01/15 0804 5\' 6"  (1.676 m)     Head Cir --      Peak  Flow --      Pain Score 05/01/15 0805 10     Pain Loc --      Pain Edu? --      Excl. in [place]? --      Constitutional: Alert and oriented. Well appearing and in no distress, but complaining of left-sided abdominal pain. Eyes: Conjunctivae are normal. PERRL. Normal extraocular movements. ENT   Head: Normocephalic and atraumatic.   Nose: No congestion/rhinnorhea.   Mouth/Throat: Mucous membranes are moist.   Neck: No stridor. Cardiovascular/Chest: Normal rate, regular rhythm.  No murmurs, rubs, or gallops. Respiratory: Normal respiratory effort without tachypnea nor retractions. Breath sounds are clear and equal bilaterally. No wheezes/rales/rhonchi. Gastrointestinal: Soft. Slightly obese. Moderate tenderness palpation in the left upper and lower quadrant and across to the suprapubic area. No focal McBurney's point tenderness. No right upper quadrant tenderness. Mild tenderness in epigastrium. No guarding or rebound.  Genitourinary/rectal:Deferred Musculoskeletal: Nontender with normal range of motion in all extremities. No joint effusions.  No lower extremity tenderness.  No edema. Neurologic:  Normal speech and language. No gross or focal neurologic deficits are appreciated. Skin:  Skin is warm, dry and intact. No rash noted. Psychiatric: Mood and affect are normal. Speech and behavior are normal. Patient exhibits appropriate insight and judgment.  ____________________________________________   EKG I, Lisa Roca, MD, the attending physician have personally viewed and interpreted all ECGs.  101 bpm. Sinus tachycardia. Narrow QRS. Normal axis. T-wave inversions anteriorly with slight ST depression ____________________________________________  LABS (pertinent positives/negatives)  Lactic acid 0.8 Comprehensive metabolic panel without significant abnormality Lipase 16 Troponin less than 0.03 White blood count 17.8 with left shift. Hemoglobin 13.3 and platelet count  231 Urinalysis 1+ leukocytes, 6-30 white blood cells and many bacteria  ____________________________________________  RADIOLOGY All Xrays were viewed by me. Imaging interpreted by Radiologist.  CT abdomen and pelvis with contrast:  IMPRESSION: 1. There is inflammatory process in left flank descending colon with focal thickening of colonic wall and significant stranding of pericolonic fat. Mild enhancement of colonic diverticular wall in axial image 49. Findings are consistent with focal colitis or acute diverticulitis. No definite evidence of perforation or pericolonic abscess. Trace amount of fluid/stranding noted in left paracolic gutter. 2. No small bowel or colonic obstruction. 3. Moderate stool noted in rectosigmoid colon. 4. Status  post appendectomy. No pericecal inflammation. 5. Postsurgical changes lumbar spine L4-L5 level. 6. Status post hysterectomy. __________________________________________  PROCEDURES  Procedure(s) performed: None  Critical Care performed: None  ____________________________________________   ED COURSE / ASSESSMENT AND PLAN  CONSULTATIONS: Hospitalist for admission  Pertinent labs & imaging results that were available during my care of the patient were reviewed by me and considered in my medical decision making (see chart for details).   Patient arrived with fairly significant amount of left-sided abdominal pain on palpation. Urinalysis positive for signs of urinary tract infection and I started Rocephin.  Due to still more pain in the abdomen that I would expect from UTI, CT scan was obtained. Her CT scan shows a focal colitis. At this point I did add Flagyl.  Patient had some relief with initial dose of Dilaudid, but pain came back pretty significantly, and patient was given a second dose of IV Dilaudid.  Patient was initially tachycardic, but since that point has a heart rate in the high 90s. Blood pressure has remained above 123456  systolically. Lactate is normal. Clinically I do not suspect sepsis. However, she does have an elevated white blood count of 17,000 and continued pain. I discussed with the patient going home if she has adequate pain control, however patient states that she is not adequate pain control. I will admit her for observation overnight for treatment of pain and nausea associated with the colitis and urinary tract infection.  Patient / Family / Caregiver informed of clinical course, medical decision-making process, and agree with plan.     ___________________________________________   FINAL CLINICAL IMPRESSION(S) / ED DIAGNOSES   Final diagnoses:  Urinary tract infection without hematuria, site unspecified  Colitis       Lisa Roca, MD 05/01/15 1147

## 2015-05-01 NOTE — Plan of Care (Addendum)
Pt c/o pain, prn meds given with improvement, resting comfortably in bed.  Pt is moderate falls, calling for assistance when needed.   Problem: Activity: Goal: Ability to implement measures to reduce episodes of fatigue will improve Outcome: Progressing Pt is stand by assist to the BR.  Pt has good mobility in bed  Problem: Education: Goal: Knowledge of the prescribed therapeutic regimen will improve Outcome: Progressing Pt education completed, information guide given, falls prevention explained.  Pt verbalized understanding.

## 2015-05-01 NOTE — ED Notes (Signed)
Pt taken to 1C by Vivien Rota, NCA via stretcher at this time.

## 2015-05-02 DIAGNOSIS — K529 Noninfective gastroenteritis and colitis, unspecified: Secondary | ICD-10-CM

## 2015-05-02 LAB — CBC
HCT: 35.7 % (ref 35.0–47.0)
Hemoglobin: 11.4 g/dL — ABNORMAL LOW (ref 12.0–16.0)
MCH: 27.9 pg (ref 26.0–34.0)
MCHC: 31.8 g/dL — AB (ref 32.0–36.0)
MCV: 87.7 fL (ref 80.0–100.0)
PLATELETS: 297 10*3/uL (ref 150–440)
RBC: 4.07 MIL/uL (ref 3.80–5.20)
RDW: 12.9 % (ref 11.5–14.5)
WBC: 12 10*3/uL — ABNORMAL HIGH (ref 3.6–11.0)

## 2015-05-02 LAB — COMPREHENSIVE METABOLIC PANEL
ALBUMIN: 3.2 g/dL — AB (ref 3.5–5.0)
ALT: 20 U/L (ref 14–54)
ANION GAP: 5 (ref 5–15)
AST: 19 U/L (ref 15–41)
Alkaline Phosphatase: 49 U/L (ref 38–126)
BILIRUBIN TOTAL: 0.6 mg/dL (ref 0.3–1.2)
BUN: 11 mg/dL (ref 6–20)
CALCIUM: 8.6 mg/dL — AB (ref 8.9–10.3)
CO2: 25 mmol/L (ref 22–32)
CREATININE: 0.93 mg/dL (ref 0.44–1.00)
Chloride: 110 mmol/L (ref 101–111)
GFR calc Af Amer: >60 mL/min (ref >60)
GLUCOSE: 89 mg/dL (ref 65–99)
POTASSIUM: 3.9 mmol/L (ref 3.5–5.1)
Sodium: 140 mmol/L (ref 135–145)
Total Protein: 6.2 g/dL — ABNORMAL LOW (ref 6.5–8.1)

## 2015-05-02 MED ORDER — CIPROFLOXACIN IN D5W 400 MG/200ML IV SOLN
400.0000 mg | Freq: Two times a day (BID) | INTRAVENOUS | Status: DC
  Administered 2015-05-02 – 2015-05-05 (×6): 400 mg via INTRAVENOUS
  Filled 2015-05-02 (×7): qty 200

## 2015-05-02 MED ORDER — LACTULOSE 10 GM/15ML PO SOLN
20.0000 g | Freq: Every day | ORAL | Status: DC | PRN
  Filled 2015-05-02: qty 30

## 2015-05-02 NOTE — Progress Notes (Signed)
2111 Pt hypotensive at 85/44. Spoke with Dr Lavetta Nielsen.  Pt asymptomatic.  1000 cc NS fluid bolus ordered.  BP improved to 101/53 at 2325. Dorna Bloom RN

## 2015-05-02 NOTE — Consult Note (Signed)
Patient ID: [name] [name], female   DOB: 22-Jan-1956, 59 y.o.   MRN: [id number]  Chief Complaint  Patient presents with  . Flank Pain  . Abdominal Pain    HPI  [name] [name] is a 59 y.o. female. who was admitted to the medical service yesterday afternoon with approximately a 2 day history of worsening and unrelenting left lower quadrant abdominal pain associated with diffuse abdominal cramping. She denies nausea and vomiting. No history of jaundice. No recent travel. She has a history of a prior colonoscopy several years ago demonstrating no significant findings. She is does not have a history of any problems with her: Normal in history of diverticulitis. She states that on Sunday she started having some crampy abdominal pain which progressed to severe abdominal pain requiring her to come home early from work and seek medical attention at our local emergency room. Imaging in the emergency room demonstrates descending colon diverticular disease and for such surgical services were contacted. Of note she's had a prior abdominal hysterectomy and appendectomy. She's had multiple back in orthopedic procedures.  Past Medical History  Diagnosis Date  . DDD (degenerative disc disease), lumbar   . Spine malformation   . Thyroid disease   . GERD (gastroesophageal reflux disease)   . Irregular heart rate   . Hypothyroidism   . Depression   . Pneumonia     hx  . Sleep apnea     unable to wear mask-sleep study 3-4 yrs ago    Past Surgical History  Procedure Laterality Date  . Abdominal hysterectomy    . Appendectomy    . Tonsillectomy    . Back surgery      l4 and l5  . Craniectomy suboccipital w/ cervical laminectomy / chiari  08  . Carpal tunnel release Bilateral   . Total hip arthroplasty Right 01/12/2013    Procedure: TOTAL HIP ARTHROPLASTY WITH AUTOGRAFT;  Surgeon: Ninetta Lights, MD;  Location: [place];  Service: Orthopedics;  Laterality: Right;    Family History  Problem Relation Age of  Onset  . COPD Mother   . Stroke Mother   . Cancer Father     Social History Social History  Substance Use Topics  . Smoking status: Never Smoker   . Smokeless tobacco: Never Used     Comment: 2004 last crack   occ alcohol  . Alcohol Use: No     Comment: 2 beers a year    Allergies  Allergen Reactions  . Morphine     REACTION: Bradycardia, Hypotension  . Diphenhydramine Hcl Palpitations    Current Facility-Administered Medications  Medication Dose Route Frequency Provider Last Rate Last Dose  . 0.9 %  sodium chloride infusion   Intravenous Continuous Demetrios Loll, MD 125 mL/hr at 05/02/15 1054    . acetaminophen (TYLENOL) tablet 650 mg  650 mg Oral Q6H PRN Bettey Costa, MD       Or  . acetaminophen (TYLENOL) suppository 650 mg  650 mg Rectal Q6H PRN Bettey Costa, MD      . alum & mag hydroxide-simeth (MAALOX/MYLANTA) 200-200-20 MG/5ML suspension 30 mL  30 mL Oral Q6H PRN Bettey Costa, MD      . aspirin EC tablet 81 mg  81 mg Oral Daily Bettey Costa, MD   81 mg at 05/02/15 0735  . calcium-vitamin D (OSCAL WITH D) 500-200 MG-UNIT per tablet 1 tablet  1 tablet Oral Q breakfast Bettey Costa, MD   1 tablet at 05/02/15 0735  .  cefTRIAXone (ROCEPHIN) 1 g in dextrose 5 % 50 mL IVPB  1 g Intravenous Q24H Bettey Costa, MD   1 g at 05/02/15 0735  . DULoxetine (CYMBALTA) DR capsule 40 mg  40 mg Oral Daily Bettey Costa, MD   40 mg at 05/02/15 0735  . enoxaparin (LOVENOX) injection 40 mg  40 mg Subcutaneous Q24H Bettey Costa, MD   40 mg at 05/01/15 2135  . fenofibrate tablet 160 mg  160 mg Oral Daily Bettey Costa, MD   160 mg at 05/02/15 0735  . lactulose (CHRONULAC) 10 GM/15ML solution 20 g  20 g Oral Daily PRN Demetrios Loll, MD      . levothyroxine (SYNTHROID, LEVOTHROID) tablet 125 mcg  125 mcg Oral QAC breakfast Bettey Costa, MD   125 mcg at 05/02/15 0735  . metroNIDAZOLE (FLAGYL) IVPB 500 mg  500 mg Intravenous Q8H Sital Mody, MD   500 mg at 05/02/15 1054  . multivitamin with minerals tablet 1 tablet  1 tablet  Oral Daily Bettey Costa, MD   1 tablet at 05/02/15 0735  . ondansetron (ZOFRAN) tablet 4 mg  4 mg Oral Q6H PRN Bettey Costa, MD       Or  . ondansetron (ZOFRAN) injection 4 mg  4 mg Intravenous Q6H PRN Bettey Costa, MD      . oxyCODONE-acetaminophen (PERCOCET/ROXICET) 5-325 MG per tablet 1 tablet  1 tablet Oral Q6H PRN Bettey Costa, MD   1 tablet at 05/02/15 1406  . pantoprazole (PROTONIX) EC tablet 40 mg  40 mg Oral Daily Bettey Costa, MD   40 mg at 05/02/15 0735  . senna-docusate (Senokot-S) tablet 1 tablet  1 tablet Oral QHS PRN Sital Mody, MD        Blood pressure 101/59, pulse 89, temperature 97.7 F (36.5 C), temperature source Oral, resp. rate 18, height '5\' 6"'$  (1.676 m), weight 201 lb (91.173 kg), SpO2 97 %.  Results for orders placed or performed during the hospital encounter of 05/01/15 (from the past 48 hour(s))  Comprehensive metabolic panel     Status: Abnormal   Collection Time: 05/01/15  9:03 AM  Result Value Ref Range   Sodium 137 135 - 145 mmol/L   Potassium 3.9 3.5 - 5.1 mmol/L   Chloride 106 101 - 111 mmol/L   CO2 23 22 - 32 mmol/L   Glucose, Bld 122 (H) 65 - 99 mg/dL   BUN 16 6 - 20 mg/dL   Creatinine, Ser 0.90 0.44 - 1.00 mg/dL   Calcium 9.5 8.9 - 10.3 mg/dL   Total Protein 7.6 6.5 - 8.1 g/dL   Albumin 4.2 3.5 - 5.0 g/dL   AST 24 15 - 41 U/L   ALT 25 14 - 54 U/L   Alkaline Phosphatase 59 38 - 126 U/L   Total Bilirubin 0.8 0.3 - 1.2 mg/dL   GFR calc non Af Amer >60 >60 mL/min   GFR calc Af Amer >60 >60 mL/min    Comment: (NOTE) The eGFR has been calculated using the CKD EPI equation. This calculation has not been validated in all clinical situations. eGFR's persistently <60 mL/min signify possible Chronic Kidney Disease.    Anion gap 8 5 - 15  Lipase, blood     Status: None   Collection Time: 05/01/15  9:03 AM  Result Value Ref Range   Lipase 16 11 - 51 U/L  Troponin I     Status: None   Collection Time: 05/01/15  9:03 AM  Result Value Ref  Range   Troponin I  <0.03 <0.031 ng/mL    Comment:        NO INDICATION OF MYOCARDIAL INJURY.   CBC with Differential     Status: Abnormal   Collection Time: 05/01/15  9:03 AM  Result Value Ref Range   WBC 17.8 (H) 3.6 - 11.0 K/uL   RBC 4.73 3.80 - 5.20 MIL/uL   Hemoglobin 13.3 12.0 - 16.0 g/dL   HCT 84.1 86.7 - 29.2 %   MCV 86.0 80.0 - 100.0 fL   MCH 28.1 26.0 - 34.0 pg   MCHC 32.7 32.0 - 36.0 g/dL   RDW 67.2 82.0 - 54.1 %   Platelets 331 150 - 440 K/uL   Neutrophils Relative % 83 %   Neutro Abs 14.7 (H) 1.4 - 6.5 K/uL   Lymphocytes Relative 9 %   Lymphs Abs 1.6 1.0 - 3.6 K/uL   Monocytes Relative 8 %   Monocytes Absolute 1.4 (H) 0.2 - 0.9 K/uL   Eosinophils Relative 0 %   Eosinophils Absolute 0.1 0 - 0.7 K/uL   Basophils Relative 0 %   Basophils Absolute 0.1 0 - 0.1 K/uL  Urinalysis complete, with microscopic     Status: Abnormal   Collection Time: 05/01/15  9:03 AM  Result Value Ref Range   Color, Urine YELLOW (A) YELLOW   APPearance HAZY (A) CLEAR   Glucose, UA NEGATIVE NEGATIVE mg/dL   Bilirubin Urine NEGATIVE NEGATIVE   Ketones, ur NEGATIVE NEGATIVE mg/dL   Specific Gravity, Urine 1.020 1.005 - 1.030   Hgb urine dipstick 2+ (A) NEGATIVE   pH 5.0 5.0 - 8.0   Protein, ur NEGATIVE NEGATIVE mg/dL   Nitrite NEGATIVE NEGATIVE   Leukocytes, UA 1+ (A) NEGATIVE   RBC / HPF 0-5 0 - 5 RBC/hpf   WBC, UA 6-30 0 - 5 WBC/hpf   Bacteria, UA MANY (A) NONE SEEN   Squamous Epithelial / LPF 0-5 (A) NONE SEEN   Mucous PRESENT   Urine culture     Status: None (Preliminary result)   Collection Time: 05/01/15  9:03 AM  Result Value Ref Range   Specimen Description URINE, RANDOM    Special Requests NONE    Culture      >=100,000 COLONIES/mL GRAM NEGATIVE RODS IDENTIFICATION AND SUSCEPTIBILITIES TO FOLLOW    Report Status PENDING   Lactic acid, plasma     Status: None   Collection Time: 05/01/15  9:55 AM  Result Value Ref Range   Lactic Acid, Venous 0.8 0.5 - 2.0 mmol/L  CBC     Status: Abnormal     Collection Time: 05/01/15  3:28 PM  Result Value Ref Range   WBC 16.0 (H) 3.6 - 11.0 K/uL   RBC 4.47 3.80 - 5.20 MIL/uL   Hemoglobin 12.6 12.0 - 16.0 g/dL   HCT 09.2 73.3 - 10.7 %   MCV 87.3 80.0 - 100.0 fL   MCH 28.2 26.0 - 34.0 pg   MCHC 32.3 32.0 - 36.0 g/dL   RDW 88.1 17.9 - 30.5 %   Platelets 311 150 - 440 K/uL  Creatinine, serum     Status: None   Collection Time: 05/01/15  3:28 PM  Result Value Ref Range   Creatinine, Ser 0.98 0.44 - 1.00 mg/dL   GFR calc non Af Amer >60 >60 mL/min   GFR calc Af Amer >60 >60 mL/min    Comment: (NOTE) The eGFR has been calculated using the CKD EPI equation. This calculation  has not been validated in all clinical situations. eGFR's persistently <60 mL/min signify possible Chronic Kidney Disease.   Comprehensive metabolic panel     Status: Abnormal   Collection Time: 05/02/15  4:55 AM  Result Value Ref Range   Sodium 140 135 - 145 mmol/L   Potassium 3.9 3.5 - 5.1 mmol/L   Chloride 110 101 - 111 mmol/L   CO2 25 22 - 32 mmol/L   Glucose, Bld 89 65 - 99 mg/dL   BUN 11 6 - 20 mg/dL   Creatinine, Ser 0.93 0.44 - 1.00 mg/dL   Calcium 8.6 (L) 8.9 - 10.3 mg/dL   Total Protein 6.2 (L) 6.5 - 8.1 g/dL   Albumin 3.2 (L) 3.5 - 5.0 g/dL   AST 19 15 - 41 U/L   ALT 20 14 - 54 U/L   Alkaline Phosphatase 49 38 - 126 U/L   Total Bilirubin 0.6 0.3 - 1.2 mg/dL   GFR calc non Af Amer >60 >60 mL/min   GFR calc Af Amer >60 >60 mL/min    Comment: (NOTE) The eGFR has been calculated using the CKD EPI equation. This calculation has not been validated in all clinical situations. eGFR's persistently <60 mL/min signify possible Chronic Kidney Disease.    Anion gap 5 5 - 15  CBC     Status: Abnormal   Collection Time: 05/02/15  4:55 AM  Result Value Ref Range   WBC 12.0 (H) 3.6 - 11.0 K/uL   RBC 4.07 3.80 - 5.20 MIL/uL   Hemoglobin 11.4 (L) 12.0 - 16.0 g/dL   HCT 35.7 35.0 - 47.0 %   MCV 87.7 80.0 - 100.0 fL   MCH 27.9 26.0 - 34.0 pg   MCHC 31.8 (L)  32.0 - 36.0 g/dL   RDW 12.9 11.5 - 14.5 %   Platelets 297 150 - 440 K/uL   Ct Abdomen Pelvis W Contrast  05/01/2015  CLINICAL DATA:  Left side flank pain for 2 days EXAM: CT ABDOMEN AND PELVIS WITH CONTRAST TECHNIQUE: Multidetector CT imaging of the abdomen and pelvis was performed using the standard protocol following bolus administration of intravenous contrast. CONTRAST:  12m OMNIPAQUE IOHEXOL 350 MG/ML SOLN COMPARISON:  None. FINDINGS: The lung bases are unremarkable. Sagittal images of the spine shows mild degenerative changes lower thoracic spine. There is posterior fusion lumbar spine at L4-L5 level. Disc space flattening with vacuum disc phenomena noted at L3-L4 and L5-S1 level. Enhanced liver is unremarkable no calcified gallstones are noted within gallbladder. No focal hepatic mass. The pancreas, spleen and adrenal glands are unremarkable. Enhanced kidneys are symmetrical size. No hydronephrosis or hydroureter. Delayed renal images shows bilateral renal symmetrical excretion. Bilateral visualized proximal ureter is unremarkable. No small bowel obstruction.  No free abdominal air. No pericecal inflammation. The terminal ileum is unremarkable. No adenopathy. In axial image 46 there is short segment thickening of descending colon wall in left flank. There is significant stranding of pericolonic fat. Colonic diverticula are noted at this level. Trace amount of fluid/ stranding noted in left paracolic gutter. There is mild enhancement of diverticular wall in axial image 49 at this level. Findings are consistent with focal colitis or acute diverticulitis. There is no evidence of pericolonic abscess or definite perforation. No extraluminal contrast material is noted. Moderate stool noted in rectosigmoid colon. The patient is status post hysterectomy. Urinary bladder is unremarkable. Degenerative changes pubic symphysis. Evaluation of the pelvis is limited by metallic artifacts from right hip prosthesis.  IMPRESSION: 1. There  is inflammatory process in left flank descending colon with focal thickening of colonic wall and significant stranding of pericolonic fat. Mild enhancement of colonic diverticular wall in axial image 49. Findings are consistent with focal colitis or acute diverticulitis. No definite evidence of perforation or pericolonic abscess. Trace amount of fluid/stranding noted in left paracolic gutter. 2. No small bowel or colonic obstruction. 3. Moderate stool noted in rectosigmoid colon. 4. Status post appendectomy.  No pericecal inflammation. 5. Postsurgical changes lumbar spine L4-L5 level. 6. Status post hysterectomy. Electronically Signed   By: Lahoma Crocker M.D.   On: 05/01/2015 11:13     Review of Systems  Constitutional: Positive for fever, chills, malaise/fatigue and diaphoresis. Negative for weight loss.  HENT: Negative.   Eyes: Negative.   Respiratory: Negative.   Cardiovascular: Negative.   Gastrointestinal: Positive for abdominal pain. Negative for nausea and vomiting.  Musculoskeletal: Negative.   Skin: Negative.   Neurological: Positive for weakness.  Endo/Heme/Allergies: Negative.   Psychiatric/Behavioral: Negative.     Physical Exam  Constitutional: She is oriented to person, place, and time and well-developed, well-nourished, and in no distress.  HENT:  Head: Atraumatic.  Eyes: Pupils are equal, round, and reactive to light. No scleral icterus.  Neck: No thyromegaly present.  Cardiovascular: Normal rate and regular rhythm.   Pulmonary/Chest: Effort normal and breath sounds normal. No respiratory distress.  Abdominal: Soft. She exhibits no distension and no mass. There is tenderness in the left lower quadrant. There is rebound and guarding.    Neurological: She is oriented to person, place, and time.  Skin: Skin is warm and dry. She is not diaphoretic.  Psychiatric: Mood, memory, affect and judgment normal.    Data Reviewed  I have personally reviewed the  patient's imaging, laboratory findings and medical records.    Assessment    This is a 59 year old white female with what sounds like the first episode of acute left sided diverticular disease and diverticulitis without obvious perforation. I see no indication at present for acute operative intervention. I agree with your plan.    Plan    As she is continuing to have some tenderness and rebound I would not advance her diet. I wrote for a clear liquid diet. I agree with intravenous antibiotics as written. I will reassess her in 24 hours. When she is pain-free she'll be ready for discharge.      Sherri Rad MD, FACS 05/02/2015, 2:42 PM

## 2015-05-02 NOTE — Progress Notes (Signed)
Pt is alert and oriented x 4, c/o Left back pain related to diverticulitis improved with prn narcotic, on room air, IV fluids infusing, receiving IV antibiotics, consulted with surgeon and surgery not appropriate at this time, on clear liquid diet, ambulatory in room, denies n/v. Surgery will f/u on 12/22. Vital signs stable, afebrile.

## 2015-05-02 NOTE — Progress Notes (Addendum)
[place] at [place] NAME: Lyriq Flamand    MR#:  [id number]  DATE OF BIRTH:  [birth year]  SUBJECTIVE:  CHIEF COMPLAINT:   Chief Complaint  Patient presents with  . Flank Pain  . Abdominal Pain   still left LLQ pain, no fever, nausea, vomiting or diarrhea.  REVIEW OF SYSTEMS:  CONSTITUTIONAL: No fever, fatigue or weakness.  EYES: No blurred or double vision.  EARS, NOSE, AND THROAT: No tinnitus or ear pain.  RESPIRATORY: No cough, shortness of breath, wheezing or hemoptysis.  CARDIOVASCULAR: No chest pain, orthopnea, edema.  GASTROINTESTINAL: No nausea, vomiting, diarrhea but has abdominal pain.  GENITOURINARY: No dysuria, hematuria.  ENDOCRINE: No polyuria, nocturia,  HEMATOLOGY: No anemia, easy bruising or bleeding SKIN: No rash or lesion. MUSCULOSKELETAL: No joint pain or arthritis.   NEUROLOGIC: No tingling, numbness, weakness.  PSYCHIATRY: No anxiety or depression.   DRUG ALLERGIES:   Allergies  Allergen Reactions  . Morphine     REACTION: Bradycardia, Hypotension  . Diphenhydramine Hcl Palpitations    VITALS:  Blood pressure 101/59, pulse 89, temperature 97.7 F (36.5 C), temperature source Oral, resp. rate 18, height 5\' 6"  (1.676 m), weight 91.173 kg (201 lb), SpO2 97 %.  PHYSICAL EXAMINATION:  GENERAL:  59 y.o.-year-old patient lying in the bed with no acute distress. Obese. EYES: Pupils equal, round, reactive to light and accommodation. No scleral icterus. Extraocular muscles intact.  HEENT: Head atraumatic, normocephalic. Oropharynx and nasopharynx clear.  NECK:  Supple, no jugular venous distention. No thyroid enlargement, no tenderness.  LUNGS: Normal breath sounds bilaterally, no wheezing, rales,rhonchi or crepitation. No use of accessory muscles of respiration.  CARDIOVASCULAR: S1, S2 normal. No murmurs, rubs, or gallops.  ABDOMEN: Soft, tenderness in LLQ, nondistended, no rigidity or rebound. Bowel sounds  present. No organomegaly or mass.  EXTREMITIES: No pedal edema, cyanosis, or clubbing.  NEUROLOGIC: Cranial nerves II through XII are intact. Muscle strength 5/5 in all extremities. Sensation intact. Gait not checked.  PSYCHIATRIC: The patient is alert and oriented x 3.  SKIN: No obvious rash, lesion, or ulcer.    LABORATORY PANEL:   CBC  Recent Labs Lab 05/02/15 0455  WBC 12.0*  HGB 11.4*  HCT 35.7  PLT 297   ------------------------------------------------------------------------------------------------------------------  Chemistries   Recent Labs Lab 05/02/15 0455  NA 140  K 3.9  CL 110  CO2 25  GLUCOSE 89  BUN 11  CREATININE 0.93  CALCIUM 8.6*  AST 19  ALT 20  ALKPHOS 49  BILITOT 0.6   ------------------------------------------------------------------------------------------------------------------  Cardiac Enzymes  Recent Labs Lab 05/01/15 0903  TROPONINI <0.03   ------------------------------------------------------------------------------------------------------------------  RADIOLOGY:  Ct Abdomen Pelvis W Contrast  05/01/2015  CLINICAL DATA:  Left side flank pain for 2 days EXAM: CT ABDOMEN AND PELVIS WITH CONTRAST TECHNIQUE: Multidetector CT imaging of the abdomen and pelvis was performed using the standard protocol following bolus administration of intravenous contrast. CONTRAST:  113mL OMNIPAQUE IOHEXOL 350 MG/ML SOLN COMPARISON:  None. FINDINGS: The lung bases are unremarkable. Sagittal images of the spine shows mild degenerative changes lower thoracic spine. There is posterior fusion lumbar spine at L4-L5 level. Disc space flattening with vacuum disc phenomena noted at L3-L4 and L5-S1 level. Enhanced liver is unremarkable no calcified gallstones are noted within gallbladder. No focal hepatic mass. The pancreas, spleen and adrenal glands are unremarkable. Enhanced kidneys are symmetrical size. No hydronephrosis or hydroureter. Delayed renal images  shows bilateral renal symmetrical excretion. Bilateral visualized proximal  ureter is unremarkable. No small bowel obstruction.  No free abdominal air. No pericecal inflammation. The terminal ileum is unremarkable. No adenopathy. In axial image 46 there is short segment thickening of descending colon wall in left flank. There is significant stranding of pericolonic fat. Colonic diverticula are noted at this level. Trace amount of fluid/ stranding noted in left paracolic gutter. There is mild enhancement of diverticular wall in axial image 49 at this level. Findings are consistent with focal colitis or acute diverticulitis. There is no evidence of pericolonic abscess or definite perforation. No extraluminal contrast material is noted. Moderate stool noted in rectosigmoid colon. The patient is status post hysterectomy. Urinary bladder is unremarkable. Degenerative changes pubic symphysis. Evaluation of the pelvis is limited by metallic artifacts from right hip prosthesis. IMPRESSION: 1. There is inflammatory process in left flank descending colon with focal thickening of colonic wall and significant stranding of pericolonic fat. Mild enhancement of colonic diverticular wall in axial image 49. Findings are consistent with focal colitis or acute diverticulitis. No definite evidence of perforation or pericolonic abscess. Trace amount of fluid/stranding noted in left paracolic gutter. 2. No small bowel or colonic obstruction. 3. Moderate stool noted in rectosigmoid colon. 4. Status post appendectomy.  No pericecal inflammation. 5. Postsurgical changes lumbar spine L4-L5 level. 6. Status post hysterectomy. Electronically Signed   By: Lahoma Crocker M.D.   On: 05/01/2015 11:13    EKG:   Orders placed or performed during the hospital encounter of 05/01/15  . ED EKG  . ED EKG    ASSESSMENT AND PLAN:    1.  Acute diverticulitis or focal colitis. Discontinue Rocephin, start Cipro and continue Flagyl.  Per Dr. Marina Gravel, no  indication for surgery. Continue pain medications as tolerated.  On clear liquid diet.  * leukocytosis. Improved.  2. Urinary tract infection: Change to Cipro, follow-up Urine culture (GNR).  3. Hypothyroidism: Continue Synthroid   4. Depression/anxiety: Continue Cymbalta    All the records are reviewed and case discussed with Care Management/Social Workerr. Management plans discussed with the patient, family and they are in agreement.  CODE STATUS: Full code  TOTAL TIME TAKING CARE OF THIS PATIENT: 36 minutes.  Greater than 50% time was spent on coordination of care and face-to-face counseling.  POSSIBLE D/C IN 2 DAYS, DEPENDING ON CLINICAL CONDITION.   Demetrios Loll M.D on 05/02/2015 at 4:01 PM  Between 7am to 6pm - Pager - [phone]  After 6pm go to [email or web address] - password [email or web address] [facility]  Office  [phone]  CC: Primary care physician; Reginia Naas, MD

## 2015-05-02 NOTE — Plan of Care (Signed)
Problem: Activity: Goal: Ability to implement measures to reduce episodes of fatigue will improve Outcome: Progressing Pt reports better pain control  Problem: Education: Goal: Knowledge of the prescribed therapeutic regimen will improve Outcome: Progressing Pt agreeable to pain medication schedule.

## 2015-05-02 NOTE — Care Management (Signed)
Admitted to [facility] with the diagnosis of colitis. Lives with ex-husband, Nathaneil Canary, [phone] or [phone]). Last seen Dr. Carol Ada December 14th. Hip surgery September 2014. [place] [facility] [place] for rehabilitation x 20 days. Home Health in the home following rehabilitation. Doesn't remember name of agency. Uses no aids for ambulation. Drives herself for errands. Appetite good today. Fell at home about 6 weeks ago. Husband will transport. Shelbie Ammons RN MSN CCM Care Management [phone]

## 2015-05-02 NOTE — Plan of Care (Signed)
Problem: Education: Goal: Knowledge of [facility] Education information/materials will improve Outcome: Progressing Pt oriented to unit  Problem: Safety: Goal: Ability to remain free from injury will improve Outcome: Progressing Bed alarm and need to call for assistance to bathroom explained to pt and pt complying  Problem: Pain Managment: Goal: General experience of comfort will improve Outcome: Progressing Pain managed with oral pain medication at this time  Problem: Bowel/Gastric: Goal: Will not experience complications related to bowel motility Outcome: Progressing Stool softener daily to prevent constipation

## 2015-05-03 MED ORDER — OXYCODONE-ACETAMINOPHEN 5-325 MG PO TABS: 1.0000 | ORAL_TABLET | Freq: Three times a day (TID) | ORAL | Status: DC | PRN

## 2015-05-03 MED ORDER — CIPROFLOXACIN HCL 500 MG PO TABS: 500.0000 mg | ORAL_TABLET | Freq: Two times a day (BID) | ORAL | Status: DC

## 2015-05-03 MED ORDER — METRONIDAZOLE 500 MG PO TABS: 500.0000 mg | ORAL_TABLET | Freq: Three times a day (TID) | ORAL | Status: DC

## 2015-05-03 NOTE — Plan of Care (Addendum)
Problem: Food- and Nutrition-Related Knowledge Deficit (NB-1.1) Goal: Nutrition education Formal process to instruct or train a patient/client in a skill or to impart knowledge to help patients/clients voluntarily manage or modify food choices and eating behavior to maintain or improve health. Outcome: Completed/Met Date Met:  05/03/15    Nutrition Education Note  RD consulted for nutrition education regarding a Low Fiber/Low Residue diet secondary to new diagnosis of Diverticulitis.  RD provided "Low Fiber Nutrition Therapy" handout from the Academy of [employer]etics. Discussed nutrition therapy to reduce the irritation of the gastrointestinal tract to promote healing. Provided list of recommended low fiberous foods in comparison to foods with high amounts of fiber, as well as a recommended sample day.    RD also educated pt on transitioning to high fiber meals, once flare resolved to prevent another diverticulitis flare secondary to nutrition history. Pt reports eating a lot of fast food, high fatty foods secondary to work schedule. Discussed ways to eat healthier with more fiber in the future. RD also recommended not starting high fiber diet until check up with MD outpatient and MD approval to do so. Pt reports understanding.  Teach back method used.  Expect good compliance.  Body mass index is 32.46 kg/(m^2).   Current diet order is CL, patient is consuming mostly ginger ale and water at this time.  Labs and medications reviewed.   LOW Care Level   Allyson West, RD, LDN Pager [phone] Weekend/On-Call Pager [phone]        

## 2015-05-03 NOTE — Progress Notes (Signed)
[facility] [facility] [place] was admitted to the [facility] on 05/01/2015 and Discharged  05/03/2015 and should be excused from work/school   for 7  days starting 05/01/2015 , may return to work/school without any restrictions.  Demetrios Loll MD.  Demetrios Loll M.D on 05/03/2015,at 10:01 AM  [place] [facility]  [phone]

## 2015-05-03 NOTE — Progress Notes (Signed)
Pt is alert and oriented x 4, c/o L back pain improved with prn narcotics, tolerating clear liquid diet, denies n/v, no bm throughout shift, refuses stool softener/ laxative, up in room independently, IV fluids infusing/ Iv antibiotics, WBC improving, services transferred to Dr. Marina Gravel. Uneventful shift.

## 2015-05-03 NOTE — Progress Notes (Signed)
Surgery   Feeling somewhat better Some nausea continues pain in LLQ  Filed Vitals:   05/02/15 1322 05/02/15 2115 05/03/15 0551 05/03/15 1339  BP: 101/59 119/70 102/57 111/63  Pulse: 89 89 82 82  Temp: 97.7 F (36.5 C) 97.7 F (36.5 C) 98.2 F (36.8 C) 98.6 F (37 C)  TempSrc: Oral Oral Oral Oral  Resp: 18 18 18 18   Height:      Weight:      SpO2: 97% 98% 93% 95%   PE  abd about the same as exam yesterday  CBC Latest Ref Rng 05/02/2015 05/01/2015 05/01/2015  WBC 3.6 - 11.0 K/uL 12.0(H) 16.0(H) 17.8(H)  Hemoglobin 12.0 - 16.0 g/dL 11.4(L) 12.6 13.3  Hematocrit 35.0 - 47.0 % 35.7 39.0 40.6  Platelets 150 - 440 K/uL 297 311 331    IMP  Diverticulitis  Plan:  Continue current Rx plan.

## 2015-05-03 NOTE — Progress Notes (Signed)
[place] at [place] NAME: [name] [name]    MR#:  [id number]  DATE OF BIRTH:  August 24, 1955  SUBJECTIVE:  CHIEF COMPLAINT:   Chief Complaint  Patient presents with  . Flank Pain  . Abdominal Pain  better left LLQ pain, no fever, nausea, vomiting or diarrhea. Tolerated clear liquid diet, wants more food.  REVIEW OF SYSTEMS:  CONSTITUTIONAL: No fever, fatigue or weakness.  EYES: No blurred or double vision.  EARS, NOSE, AND THROAT: No tinnitus or ear pain.  RESPIRATORY: No cough, shortness of breath, wheezing or hemoptysis.  CARDIOVASCULAR: No chest pain, orthopnea, edema.  GASTROINTESTINAL: No nausea, vomiting, diarrhea but has abdominal pain.  GENITOURINARY: No dysuria, hematuria.  ENDOCRINE: No polyuria, nocturia,  HEMATOLOGY: No anemia, easy bruising or bleeding SKIN: No rash or lesion. MUSCULOSKELETAL: No joint pain or arthritis.   NEUROLOGIC: No tingling, numbness, weakness.  PSYCHIATRY: No anxiety or depression.   DRUG ALLERGIES:   Allergies  Allergen Reactions  . Morphine     REACTION: Bradycardia, Hypotension  . Diphenhydramine Hcl Palpitations    VITALS:  Blood pressure 111/63, pulse 82, temperature 98.6 F (37 C), temperature source Oral, resp. rate 18, height 5\' 6"  (1.676 m), weight 91.173 kg (201 lb), SpO2 95 %.  PHYSICAL EXAMINATION:  GENERAL:  59 y.o.-year-old patient lying in the bed with no acute distress. Obese. EYES: Pupils equal, round, reactive to light and accommodation. No scleral icterus. Extraocular muscles intact.  HEENT: Head atraumatic, normocephalic. Oropharynx and nasopharynx clear.  NECK:  Supple, no jugular venous distention. No thyroid enlargement, no tenderness.  LUNGS: Normal breath sounds bilaterally, no wheezing, rales,rhonchi or crepitation. No use of accessory muscles of respiration.  CARDIOVASCULAR: S1, S2 normal. No murmurs, rubs, or gallops.  ABDOMEN: Soft, tenderness in LLQ,  nondistended, no rigidity or rebound. Bowel sounds present. No organomegaly or mass.  EXTREMITIES: No pedal edema, cyanosis, or clubbing.  NEUROLOGIC: Cranial nerves II through XII are intact. Muscle strength 5/5 in all extremities. Sensation intact. Gait not checked.  PSYCHIATRIC: The patient is alert and oriented x 3.  SKIN: No obvious rash, lesion, or ulcer.    LABORATORY PANEL:   CBC  Recent Labs Lab 05/02/15 0455  WBC 12.0*  HGB 11.4*  HCT 35.7  PLT 297   ------------------------------------------------------------------------------------------------------------------  Chemistries   Recent Labs Lab 05/02/15 0455  NA 140  K 3.9  CL 110  CO2 25  GLUCOSE 89  BUN 11  CREATININE 0.93  CALCIUM 8.6*  AST 19  ALT 20  ALKPHOS 49  BILITOT 0.6   ------------------------------------------------------------------------------------------------------------------  Cardiac Enzymes  Recent Labs Lab 05/01/15 0903  TROPONINI <0.03   ------------------------------------------------------------------------------------------------------------------  RADIOLOGY:  No results found.  EKG:   Orders placed or performed during the hospital encounter of 05/01/15  . ED EKG  . ED EKG    ASSESSMENT AND PLAN:    1.  Acute diverticulitis or focal colitis. Discontinued Rocephin, continue Cipro and continue Flagyl.  Per Dr. Marina Gravel, transfer to his service. Continue pain medications as tolerated.  On clear liquid diet.  * leukocytosis. Improved.  2. Urinary tract infection: Changed to Cipro, Urine culture (E Coli), sensitive to cipro.  3. Hypothyroidism: Continue Synthroid   4. Depression/anxiety: Continue Cymbalta  Transfer to Dr. Marina Gravel service. Sign off.  All the records are reviewed and case discussed with Care Management/Social Workerr. Management plans discussed with the patient, family and they are in agreement.  CODE STATUS: Full code  TOTAL TIME TAKING CARE OF  THIS PATIENT: 35 minutes.  Greater than 50% time was spent on coordination of care and face-to-face counseling.  POSSIBLE D/C IN 2 DAYS, DEPENDING ON CLINICAL CONDITION.   Demetrios Loll M.D on 05/03/2015 at 1:47 PM  Between 7am to 6pm - Pager - [phone]  After 6pm go to [email or web address] - password [email or web address] [facility]  Office  [phone]  CC: Primary care physician; Reginia Naas, MD

## 2015-05-03 NOTE — Plan of Care (Signed)
Problem: Bowel/Gastric: Goal: Will not experience complications related to bowel motility Outcome: Progressing Pt alert and oriented. Percocet prn x's two with relief. Up to the bathroom with out difficulties. Iv fluids and iv antibiotics. Continue to monitor.

## 2015-05-03 NOTE — Progress Notes (Signed)
Notified Dr. Marina Gravel via telephone that patient is being discharged per Dr. Bridgett Larsson, per Dr. Marina Gravel patietn is not ready and to transfer patient to ely surgical services.

## 2015-05-04 NOTE — Progress Notes (Signed)
Problem: Bowel/Gastric:  Goal: Will not experience complications related to bowel motility Outcome: Progressing Pt alert and oriented. Diet advance to soft and patient tolerated new diet well with no c/o nausea or vomiting  Percocet for pain with relief. Continues on iv fluids. Up to the bathroom without difficulties. Continues on IV antibiotics. Continue to monitor.

## 2015-05-04 NOTE — Plan of Care (Signed)
Problem: Bowel/Gastric: Goal: Will not experience complications related to bowel motility Outcome: Progressing Pt alert and oriented. Percocet for pain with relief. No c/o nausea/vomiting this shift. Continues on iv fluids. Up to the bathroom without difficulties. Continues on clear liquid diet. IV antibiotics. Continue to monitor.

## 2015-05-04 NOTE — Progress Notes (Signed)
Patient ID: [name] [name], female   DOB: [birth year], 59 y.o.   MRN: [id number]   She is feeling better. She is passing gas. She is tolerating a clear liquid diet.  Filed Vitals:   05/03/15 0551 05/03/15 1339 05/03/15 2044 05/04/15 0527  BP: 102/57 111/63 114/59 108/56  Pulse: 82 82 85 85  Temp: 98.2 F (36.8 C) 98.6 F (37 C) 98.6 F (37 C) 98 F (36.7 C)  TempSrc: Oral Oral Oral Oral  Resp: 18 18 18 18   Height:      Weight:      SpO2: 93% 95% 96% 91%    Physical examination she is alert and oriented. Abdomen is soft with minimal tenderness in the left mid abdomen laterally. Certainly no rebound tenderness or peritoneal signs.  CBC Latest Ref Rng 05/02/2015 05/01/2015 05/01/2015  WBC 3.6 - 11.0 K/uL 12.0(H) 16.0(H) 17.8(H)  Hemoglobin 12.0 - 16.0 g/dL 11.4(L) 12.6 13.3  Hematocrit 35.0 - 47.0 % 35.7 39.0 40.6  Platelets 150 - 440 K/uL 297 311 331    Impression: Resolving left colon diverticulitis.  Plan low fiber diet likely home in the morning. Will adjust IV fluid infusion rate.

## 2015-05-05 LAB — CBC
HEMATOCRIT: 32.9 % — AB (ref 35.0–47.0)
Hemoglobin: 10.9 g/dL — ABNORMAL LOW (ref 12.0–16.0)
MCH: 28 pg (ref 26.0–34.0)
MCHC: 33.3 g/dL (ref 32.0–36.0)
MCV: 84.2 fL (ref 80.0–100.0)
PLATELETS: 306 10*3/uL (ref 150–440)
RBC: 3.91 MIL/uL (ref 3.80–5.20)
RDW: 12.6 % (ref 11.5–14.5)
WBC: 5 10*3/uL (ref 3.6–11.0)

## 2015-05-05 MED ORDER — METRONIDAZOLE 500 MG PO TABS: 500.0000 mg | ORAL_TABLET | Freq: Three times a day (TID) | ORAL | Status: DC

## 2015-05-05 MED ORDER — ONDANSETRON HCL 4 MG PO TABS: 4.0000 mg | ORAL_TABLET | Freq: Three times a day (TID) | ORAL | Status: DC | PRN

## 2015-05-05 MED ORDER — CIPRO 500 MG PO TABS
500.0000 mg | ORAL_TABLET | Freq: Two times a day (BID) | ORAL | Status: DC
Start: 2015-05-05 — End: 2015-07-01

## 2015-05-05 MED ORDER — OXYCODONE-ACETAMINOPHEN 5-325 MG PO TABS: 1.0000 | ORAL_TABLET | Freq: Three times a day (TID) | ORAL | Status: DC | PRN

## 2015-05-05 NOTE — Progress Notes (Signed)
Patient had no c/o pain this shift VSS Patient is tolerating diet well with no nausea Received MD order to discharge patient to home, reviewed home med, and prescriptions with patient along with discharge instructions and patient verbalized understanding discharged to home in wheelchair by nursing spouse drove patient home

## 2015-05-05 NOTE — Final Progress Note (Signed)
Physician Final Progress Note  Feels ok.  No pain Tolerating diet  Filed Vitals:   05/04/15 0527 05/04/15 1556 05/04/15 2115 05/05/15 0537  BP: 108/56 122/63 123/64 112/59  Pulse: 85 91 89 81  Temp: 98 F (36.7 C) 97.8 F (36.6 C) 98.2 F (36.8 C) 98 F (36.7 C)  TempSrc: Oral Oral Oral Oral  Resp: 18 20 18 18   Height:      Weight:      SpO2: 91% 98% 96% 93%    abd soft and nontender  No new labs  Stable for discharge  F/u in 10 days

## 2015-05-05 NOTE — Care Management Note (Signed)
Case Management Note  Patient Details  Name: [name] [name] MRN: [id number] Date of Birth: December 10, 1955  Subjective/Objective:    Discussed home health needs with [name]. She denies having any home health services currently and stated to this writer that she does not need home health at this time. No other needs identified. [name] reports that she will be returning home to her significant other who "will take care of me."                 Action/Plan:   Expected Discharge Date:  05/04/15               Expected Discharge Plan:     In-House Referral:     Discharge planning Services     Post Acute Care Choice:    Choice offered to:     DME Arranged:    DME Agency:     HH Arranged:    [place] Agency:     Status of Service:     Medicare Important Message Given:    Date Medicare IM Given:    Medicare IM give by:    Date Additional Medicare IM Given:    Additional Medicare Important Message give by:     If discussed at [place] of Stay Meetings, dates discussed:    Additional Comments:  Sanna Porcaro A, RN 05/05/2015, 11:36 AM

## 2015-05-05 NOTE — Plan of Care (Signed)
Problem: Bowel/Gastric: Goal: Will not experience complications related to bowel motility Outcome: Progressing Pt has been resting comfortably during shift. Pt report pain once and was given meds accordingly. No other signs of distress noted. Will continue to monitor.

## 2015-05-05 NOTE — Discharge Summary (Signed)
Physician Discharge Summary  Patient ID: [name] [name] MRN: [id number] DOB/AGE: 1955/10/01 59 y.o.  Admit date: 05/01/2015 Discharge date: 05/05/2015  Admission Diagnoses: Descending colon diverticulitis.  Discharge Diagnoses:  Same  Hospital Course: The patient was admitted to the medical service with diverticulitis of the descending colon. Surgical services were consulted. Medicine service for discharge her on the 22nd. I transferred her to my service. Her diet was able to be advanced from clears to regular as her pain improved with intravenous Cipro and Flagyl. She was discharged home on the 24th in stable condition without slowly no pain or physical examination which was benign.  Discharge Exam: Blood pressure 112/59, pulse 81, temperature 98 F (36.7 C), temperature source Oral, resp. rate 18, height 5\' 6"  (1.676 m), weight 201 lb (91.173 kg), SpO2 93 %. Soft nontender obese abdomen.  Disposition: 01-Home or Self Care  Discharge Instructions    Call MD for:  persistant nausea and vomiting    Complete by:  As directed      Call MD for:  severe uncontrolled pain    Complete by:  As directed      Call MD for:  temperature >100.4    Complete by:  As directed      Diet - low sodium heart healthy    Complete by:  As directed      Diet general    Complete by:  As directed   Low fiber.     Discharge instructions    Complete by:  As directed   Follow low fiber diet Call office Monday for appt.in about 10 days for follow-up.     Increase activity slowly    Complete by:  As directed      Increase activity slowly    Complete by:  As directed             Medication List    TAKE these medications        aspirin EC 81 MG tablet  Take 81 mg by mouth daily.     calcium-vitamin D 500-200 MG-UNIT tablet  Commonly known as:  OSCAL WITH D  Take 1 tablet by mouth daily with breakfast.     ciprofloxacin 500 MG tablet  Commonly known as:  CIPRO  Take 1 tablet (500 mg total) by  mouth 2 (two) times daily.     DULoxetine 20 MG capsule  Commonly known as:  CYMBALTA  Take 40 mg by mouth daily.     fenofibrate 160 MG tablet  Take 160 mg by mouth daily.     levothyroxine 125 MCG tablet  Commonly known as:  SYNTHROID, LEVOTHROID  Take 125 mcg by mouth daily before breakfast.     metroNIDAZOLE 500 MG tablet  Commonly known as:  FLAGYL  Take 1 tablet (500 mg total) by mouth 3 (three) times daily.     multivitamin with minerals Tabs tablet  Take 1 tablet by mouth daily.     omeprazole 20 MG capsule  Commonly known as:  PRILOSEC  Take 20 mg by mouth daily.     ondansetron 4 MG tablet  Commonly known as:  ZOFRAN  Take 1 tablet (4 mg total) by mouth every 8 (eight) hours as needed for nausea or vomiting.     oxyCODONE-acetaminophen 5-325 MG tablet  Commonly known as:  PERCOCET/ROXICET  Take 1 tablet by mouth every 8 (eight) hours as needed for moderate pain or severe pain.     traZODone 100 MG tablet  Commonly known as:  DESYREL  Take 100 mg by mouth at bedtime as needed.           Follow-up Information    Follow up with Sherri Rad, MD In 10 days.   Specialties:  Surgery, Radiology   Contact information:   [address] [place] [address] [phone]       Signed: Sherri Rad 05/05/2015, 11:20 AM

## 2015-05-06 LAB — URINE CULTURE: Culture: >=100000

## 2015-05-18 ENCOUNTER — Other Ambulatory Visit: Payer: Self-pay

## 2015-05-18 ENCOUNTER — Ambulatory Visit (INDEPENDENT_AMBULATORY_CARE_PROVIDER_SITE_OTHER): Payer: 59 | Admitting: Surgery

## 2015-05-18 ENCOUNTER — Telehealth: Payer: Self-pay

## 2015-05-18 ENCOUNTER — Encounter: Payer: Self-pay | Admitting: Surgery

## 2015-05-18 VITALS — BP 128/83 | HR 84 | Temp 98.3°F | Ht 66.0 in | Wt 207.0 lb

## 2015-05-18 DIAGNOSIS — K5792 Diverticulitis of intestine, part unspecified, without perforation or abscess without bleeding: Secondary | ICD-10-CM | POA: Diagnosis not present

## 2015-05-18 NOTE — Telephone Encounter (Signed)
Patient seen in office today. Disability was given to myself during the appointment and have been placed at the front for scanning at this time. She will call back to the office with a fax number to return these forms to. She was given a disability certificate to give to her work today.

## 2015-05-18 NOTE — Patient Instructions (Signed)
We will give you a call once we have arranged a Physician to do a Colonoscopy in approximately 2 months.  Please call our office with ANY questions or concerns.

## 2015-05-20 NOTE — Progress Notes (Signed)
Subjective:     Patient ID: [name] [name], female   DOB: 03-07-1956, 60 y.o.   MRN: [id number]  HPI 60 yr old female with first bout of diverticulitis.  Patient states feeling much better.  She does endorse having some diarrhea still but sometimes is having regular stools as well.  She states still some fatigue but denies any nausea, vomiting, fever, chills, or constant abdominal pain.  She states occasional twinges of left sided pain that come with some of her stools but not all the time.     Review of Systems  Constitutional: Positive for activity change, appetite change and fatigue. Negative for fever, chills and unexpected weight change.  HENT: Negative for congestion and sore throat.   Respiratory: Negative for cough, shortness of breath and wheezing.   Cardiovascular: Negative for chest pain, palpitations and leg swelling.  Gastrointestinal: Positive for abdominal pain and diarrhea. Negative for nausea, vomiting, constipation, blood in stool and abdominal distention.  Genitourinary: Negative for dysuria and hematuria.  Musculoskeletal: Negative for myalgias, back pain and joint swelling.  Skin: Negative for color change, pallor, rash and wound.  Neurological: Negative for dizziness and weakness.  Hematological: Negative for adenopathy. Does not bruise/bleed easily.  Psychiatric/Behavioral: Negative for agitation. The patient is not nervous/anxious.        Filed Vitals:   05/18/15 1351  BP: 128/83  Pulse: 84  Temp: 98.3 F (36.8 C)    Objective:   Physical Exam  Constitutional: She is oriented to person, place, and time. She appears well-developed and well-nourished. No distress.  Cardiovascular: Normal rate, regular rhythm, normal heart sounds and intact distal pulses.  Exam reveals no gallop and no friction rub.   No murmur heard. Pulmonary/Chest: Effort normal and breath sounds normal. No respiratory distress. She has no wheezes. She has no rales.  Abdominal: Soft. She  exhibits no distension. There is no tenderness. There is no rebound.  Musculoskeletal: Normal range of motion. She exhibits no edema or tenderness.  Neurological: She is alert and oriented to person, place, and time. No cranial nerve deficit.  Skin: Skin is warm and dry. No rash noted. No erythema.  Psychiatric: She has a normal mood and affect. Her behavior is normal. Judgment and thought content normal.  Vitals reviewed.      Assessment:     60 yr old after first occurrence of acute diverticulitis     Plan:     Discussed with her that some twinges of pain normal and diarrhea should subside when she finishes antibiotics.  As long as she recovers well from this, do not have to plan elective removal but did discuss that she does have a chance of it return again.  Will have her avoid seeds, nuts, corn and popcorn for the next couple of months and have her f/u with GI for colonoscopy in 2 months.

## 2015-05-21 NOTE — Telephone Encounter (Signed)
FMLA Form was faxed to [phone] on 05/21/2015.

## 2015-05-22 ENCOUNTER — Telehealth: Payer: Self-pay

## 2015-05-22 NOTE — Telephone Encounter (Signed)
Patient was seen in office on 05/18/15 for follow-up from [place]. She will need a Colonoscopy in 2 months. Please triage and arrange.

## 2015-05-24 ENCOUNTER — Other Ambulatory Visit: Payer: Self-pay

## 2015-05-28 NOTE — Telephone Encounter (Signed)
Per Sharyn Lull in [place] office, patient has been scheduled for 07/17/15 for Colonoscopy.

## 2015-05-29 ENCOUNTER — Telehealth: Payer: Self-pay

## 2015-05-29 NOTE — Telephone Encounter (Signed)
New FMLA Form was filled and faxed.

## 2015-06-05 ENCOUNTER — Telehealth: Payer: Self-pay

## 2015-06-05 NOTE — Telephone Encounter (Signed)
Error

## 2015-06-05 NOTE — Telephone Encounter (Signed)
FMLA Form was filled and faxed.

## 2015-07-01 ENCOUNTER — Emergency Department: Payer: 59

## 2015-07-01 ENCOUNTER — Encounter: Payer: Self-pay | Admitting: Radiology

## 2015-07-01 ENCOUNTER — Emergency Department
Admission: EM | Admit: 2015-07-01 | Discharge: 2015-07-01 | Disposition: A | Discharge status: Home / Self Care | Payer: 59 | Attending: Emergency Medicine | Admitting: Emergency Medicine

## 2015-07-01 DIAGNOSIS — Z79899 Other long term (current) drug therapy: Secondary | ICD-10-CM | POA: Diagnosis not present

## 2015-07-01 DIAGNOSIS — Z7982 Long term (current) use of aspirin: Secondary | ICD-10-CM | POA: Diagnosis not present

## 2015-07-01 DIAGNOSIS — R000 Tachycardia, unspecified: Secondary | ICD-10-CM | POA: Diagnosis not present

## 2015-07-01 DIAGNOSIS — R1084 Generalized abdominal pain: Secondary | ICD-10-CM | POA: Diagnosis present

## 2015-07-01 DIAGNOSIS — K5792 Diverticulitis of intestine, part unspecified, without perforation or abscess without bleeding: Secondary | ICD-10-CM | POA: Insufficient documentation

## 2015-07-01 LAB — COMPREHENSIVE METABOLIC PANEL
ALT: 44 U/L (ref 14–54)
AST: 48 U/L — ABNORMAL HIGH (ref 15–41)
Albumin: 4.1 g/dL (ref 3.5–5.0)
Alkaline Phosphatase: 55 U/L (ref 38–126)
Anion gap: 8 (ref 5–15)
BUN: 25 mg/dL — ABNORMAL HIGH (ref 6–20)
CALCIUM: 9.5 mg/dL (ref 8.9–10.3)
CHLORIDE: 111 mmol/L (ref 101–111)
CO2: 20 mmol/L — ABNORMAL LOW (ref 22–32)
CREATININE: 1.05 mg/dL — AB (ref 0.44–1.00)
GFR, EST NON AFRICAN AMERICAN: 57 mL/min — AB (ref >60)
Glucose, Bld: 136 mg/dL — ABNORMAL HIGH (ref 65–99)
Potassium: 3.8 mmol/L (ref 3.5–5.1)
Sodium: 139 mmol/L (ref 135–145)
Total Bilirubin: 0.6 mg/dL (ref 0.3–1.2)
Total Protein: 7.2 g/dL (ref 6.5–8.1)

## 2015-07-01 LAB — CBC
HCT: 43.2 % (ref 35.0–47.0)
Hemoglobin: 14.5 g/dL (ref 12.0–16.0)
MCH: 28.4 pg (ref 26.0–34.0)
MCHC: 33.5 g/dL (ref 32.0–36.0)
MCV: 84.7 fL (ref 80.0–100.0)
PLATELETS: 366 10*3/uL (ref 150–440)
RBC: 5.1 MIL/uL (ref 3.80–5.20)
RDW: 13.6 % (ref 11.5–14.5)
WBC: 11.2 10*3/uL — AB (ref 3.6–11.0)

## 2015-07-01 LAB — LIPASE, BLOOD: LIPASE: 21 U/L (ref 11–51)

## 2015-07-01 LAB — TROPONIN I

## 2015-07-01 MED ORDER — HYDROMORPHONE HCL 1 MG/ML IJ SOLN
0.5000 mg | Freq: Once | INTRAMUSCULAR | Status: AC
  Administered 2015-07-01: 0.5 mg via INTRAVENOUS
  Filled 2015-07-01: qty 1

## 2015-07-01 MED ORDER — OXYCODONE-ACETAMINOPHEN 5-325 MG PO TABS: 1.0000 | ORAL_TABLET | Freq: Four times a day (QID) | ORAL | Status: DC | PRN

## 2015-07-01 MED ORDER — METRONIDAZOLE 500 MG PO TABS
500.0000 mg | ORAL_TABLET | Freq: Once | ORAL | Status: AC
  Administered 2015-07-01: 500 mg via ORAL
  Filled 2015-07-01: qty 1

## 2015-07-01 MED ORDER — HYDROMORPHONE HCL 1 MG/ML IJ SOLN
0.5000 mg | Freq: Once | INTRAMUSCULAR | Status: AC
  Administered 2015-07-01: 0.5 mg via INTRAVENOUS

## 2015-07-01 MED ORDER — ONDANSETRON 4 MG PO TBDP: 4.0000 mg | ORAL_TABLET | Freq: Three times a day (TID) | ORAL | Status: DC | PRN

## 2015-07-01 MED ORDER — ONDANSETRON HCL 4 MG PO TABS
4.0000 mg | ORAL_TABLET | Freq: Once | ORAL | Status: AC
  Administered 2015-07-01: 4 mg via ORAL
  Filled 2015-07-01: qty 1

## 2015-07-01 MED ORDER — CIPROFLOXACIN HCL 500 MG PO TABS
500.0000 mg | ORAL_TABLET | Freq: Once | ORAL | Status: AC
  Administered 2015-07-01: 500 mg via ORAL
  Filled 2015-07-01: qty 1

## 2015-07-01 MED ORDER — IOHEXOL 300 MG/ML  SOLN
100.0000 mL | Freq: Once | INTRAMUSCULAR | Status: AC | PRN
  Administered 2015-07-01: 100 mL via INTRAVENOUS

## 2015-07-01 MED ORDER — SODIUM CHLORIDE 0.9 % IV BOLUS (SEPSIS)
1000.0000 mL | Freq: Once | INTRAVENOUS | Status: AC
Start: 2015-07-01 — End: 2015-07-01
  Administered 2015-07-01: 1000 mL via INTRAVENOUS

## 2015-07-01 MED ORDER — METRONIDAZOLE 500 MG PO TABS: 500.0000 mg | ORAL_TABLET | Freq: Three times a day (TID) | ORAL | Status: DC

## 2015-07-01 MED ORDER — CIPROFLOXACIN HCL 500 MG PO TABS: 500.0000 mg | ORAL_TABLET | Freq: Two times a day (BID) | ORAL | Status: DC

## 2015-07-01 MED ORDER — IOHEXOL 240 MG/ML SOLN
25.0000 mL | Freq: Once | INTRAMUSCULAR | Status: AC | PRN
  Administered 2015-07-01: 25 mL via ORAL

## 2015-07-01 NOTE — ED Notes (Signed)
Pt states that she started with severe abd pain after a BM this afternoon, pt is diaphoretic, pale, moaning in pain, nauseated, and states hx of diverticulitis.

## 2015-07-01 NOTE — ED Provider Notes (Signed)
[facility] Emergency Department Provider Note  ____________________________________________  Time seen: 3:00 PM  I have reviewed the triage vital signs and the nursing notes.   HISTORY  Chief Complaint Abdominal Pain    HPI [name] [name] is a 60 y.o. female who complains of generalized abdominal pain for the past 3 or 4 hours, severe, nonradiating. Says he with nausea and one episode of vomiting. She has a history of diverticulitis and this feels similar. Onset of pain was after straining to have a bowel movement. Denies any bloody stool or hematemesis. No fever chest pain shortness of breath.     Past Medical History  Diagnosis Date  . DDD (degenerative disc disease), lumbar   . Spine malformation     Kiari Malformation  . Thyroid disease   . GERD (gastroesophageal reflux disease)   . Irregular heart rate   . Hypothyroidism   . Depression   . Pneumonia     hx  . Sleep apnea     unable to wear mask-sleep study 3-4 yrs ago     Patient Active Problem List   Diagnosis Date Noted  . Acute diverticulitis 05/18/2015  . Colitis 05/01/2015  . Generalized abdominal pain 01/08/2015  . Obesity 01/08/2015  . Dizziness 01/08/2015  . HYPOTHYROIDISM 11/07/2009  . CALLUSES, LEFT FOOT 11/07/2009  . FASCIITIS, PLANTAR 11/07/2009     Past Surgical History  Procedure Laterality Date  . Tonsillectomy  as child  . Back surgery  1992    l4 and l5  . Craniectomy suboccipital w/ cervical laminectomy / chiari  08  . Carpal tunnel release Bilateral 2012  . Total hip arthroplasty Right 01/12/2013    Procedure: TOTAL HIP ARTHROPLASTY WITH AUTOGRAFT;  Surgeon: Ninetta Lights, MD;  Location: [facility];  Service: Orthopedics;  Laterality: Right;  . Appendectomy  60 years old  . Abdominal hysterectomy  60 years old     Current Outpatient Rx  Name  Route  Sig  Dispense  Refill  . aspirin EC 81 MG tablet   Oral   Take 81 mg by mouth daily.         .  calcium-vitamin D (OSCAL WITH D) 500-200 MG-UNIT per tablet   Oral   Take 1 tablet by mouth daily with breakfast.         . ciprofloxacin (CIPRO) 500 MG tablet   Oral   Take 1 tablet (500 mg total) by mouth 2 (two) times daily.   14 tablet   0   . DULoxetine (CYMBALTA) 60 MG capsule   Oral   Take 1 capsule by mouth daily.          . fenofibrate 160 MG tablet   Oral   Take 160 mg by mouth daily.           Marland Kitchen levothyroxine (SYNTHROID, LEVOTHROID) 125 MCG tablet   Oral   Take 125 mcg by mouth daily before breakfast.         . metroNIDAZOLE (FLAGYL) 500 MG tablet   Oral   Take 1 tablet (500 mg total) by mouth 3 (three) times daily.   30 tablet   0   . Multiple Vitamin (MULTIVITAMIN WITH MINERALS) TABS tablet   Oral   Take 1 tablet by mouth daily.         Marland Kitchen omeprazole (PRILOSEC) 20 MG capsule   Oral   Take 20 mg by mouth daily.           Marland Kitchen  ondansetron (ZOFRAN ODT) 4 MG disintegrating tablet   Oral   Take 1 tablet (4 mg total) by mouth every 8 (eight) hours as needed for nausea or vomiting.   20 tablet   0   . ondansetron (ZOFRAN) 4 MG tablet   Oral   Take 1 tablet (4 mg total) by mouth every 8 (eight) hours as needed for nausea or vomiting.   20 tablet   0   . oxyCODONE-acetaminophen (ROXICET) 5-325 MG tablet   Oral   Take 1 tablet by mouth every 6 (six) hours as needed for severe pain.   12 tablet   0   . traZODone (DESYREL) 100 MG tablet   Oral   Take 100 mg by mouth at bedtime as needed.             Allergies Morphine and Diphenhydramine hcl   Family History  Problem Relation Age of Onset  . COPD Mother   . Stroke Mother   . Cancer Father     Social History Social History  Substance Use Topics  . Smoking status: Never Smoker   . Smokeless tobacco: Never Used     Comment: 2004 last crack   occ alcohol  . Alcohol Use: No     Comment: 2 beers a year    Review of Systems  Constitutional:   No fever or chills. No weight  changes Eyes:   No blurry vision or double vision.  ENT:   No sore throat.  Cardiovascular:   No chest pain. Respiratory:   No dyspnea or cough. Gastrointestinal:   Positive as above for abdominal pain with vomiting.Marland Kitchen  No BRBPR or melena. Genitourinary:   Negative for dysuria or difficulty urinating. Musculoskeletal:   Negative for back pain. No joint swelling or pain. Skin:   Negative for rash. Neurological:   Negative for headaches, focal weakness or numbness. Psychiatric:  No anxiety or depression.   Endocrine:  No changes in energy or sleep difficulty.  10-point ROS otherwise negative.  ____________________________________________   PHYSICAL EXAM:  VITAL SIGNS: ED Triage Vitals  Enc Vitals Group     BP 07/01/15 1446 96/60 mmHg     Pulse Rate 07/01/15 1446 100     Resp 07/01/15 1446 24     Temp --      Temp src --      SpO2 07/01/15 1446 99 %     Weight 07/01/15 1446 205 lb (92.987 kg)     Height 07/01/15 1446 5\' 5"  (1.651 m)     Head Cir --      Peak Flow --      Pain Score 07/01/15 1447 10     Pain Loc --      Pain Edu? --      Excl. in [place]? --     Vital signs reviewed, nursing assessments reviewed.   Constitutional:   Alert and oriented. Mild distress due to pain. Eyes:   No scleral icterus. No conjunctival pallor. PERRL. EOMI ENT   Head:   Normocephalic and atraumatic.   Nose:   No congestion/rhinnorhea. No septal hematoma   Mouth/Throat:   MMM, no pharyngeal erythema. No peritonsillar mass.    Neck:   No stridor. No SubQ emphysema. No meningismus. Hematological/Lymphatic/Immunilogical:   No cervical lymphadenopathy. Cardiovascular:   Tachycardia heart rate 100. Symmetric bilateral radial and DP pulses.  No murmurs.  Respiratory:   Normal respiratory effort without tachypnea nor retractions. Breath sounds are clear and equal  bilaterally. No wheezes/rales/rhonchi. Gastrointestinal:   Soft with diffuse tenderness and guarding.. Non distended.  There is no CVA tenderness.  No rebound or rigidity. Genitourinary:   deferred Musculoskeletal:   Nontender with normal range of motion in all extremities. No joint effusions.  No lower extremity tenderness.  No edema. Neurologic:   Normal speech and language.  CN 2-10 normal. Motor grossly intact. No gross focal neurologic deficits are appreciated.  Skin:    Skin is warm, dry and intact. No rash noted.  No petechiae, purpura, or bullae. Psychiatric:   Mood and affect are normal. No SI or hallucinations ____________________________________________    LABS (pertinent positives/negatives) (all labs ordered are listed, but only abnormal results are displayed) Labs Reviewed  COMPREHENSIVE METABOLIC PANEL - Abnormal; Notable for the following:    CO2 20 (*)    Glucose, Bld 136 (*)    BUN 25 (*)    Creatinine, Ser 1.05 (*)    AST 48 (*)    GFR calc non Af Amer 57 (*)    All other components within normal limits  CBC - Abnormal; Notable for the following:    WBC 11.2 (*)    All other components within normal limits  LIPASE, BLOOD  TROPONIN I  URINALYSIS COMPLETEWITH MICROSCOPIC ([facility] ONLY)   ____________________________________________   EKG  Interpreted by me Normal sinus rhythm rate of 96, normal axis and intervals. Right bundle branch block. T-wave inversions in V2 through V5. Unchanged from 03/02/2015.  ____________________________________________    RADIOLOGY  CT abdomen and pelvis reveals diverticulitis of the descending colon, no perforation or abscess.  ____________________________________________   PROCEDURES   ____________________________________________   INITIAL IMPRESSION / ASSESSMENT AND PLAN / ED COURSE  Pertinent labs & imaging results that were available during my care of the patient were reviewed by me and considered in my medical decision making (see chart for details).  Patient presents with generalized abdominal pain and concern for  diverticulitis and microperforation especially with some evidence of peritonitis on exam. IV fluids, Dilaudid, Zofran. Check CT.  ----------------------------------------- 5:44 PM on 07/01/2015 -----------------------------------------  CT negative for perforation or abscess. Does have diverticulitis but actually more mild radiographically that it was previously. We'll start patient on Cipro and Flagyl, prescribed Zofran and Percocet as these worked well for her in the past. She reports a sensitivity to morphine and related to histamine release, but states that she does well with oxycodone. She tolerated Dilaudid well in the ED here.     ____________________________________________   FINAL CLINICAL IMPRESSION(S) / ED DIAGNOSES  Final diagnoses:  Acute diverticulitis  Generalized abdominal pain      Carrie Mew, MD 07/01/15 1744

## 2015-07-01 NOTE — ED Notes (Signed)
Pt tolerating fluids well. 

## 2015-07-01 NOTE — Discharge Instructions (Signed)
You were prescribed a medication that is potentially sedating. Do not drink alcohol, drive or participate in any other potentially dangerous activities while taking this medication as it may make you sleepy. Do not take this medication with any other sedating medications, either prescription or over-the-counter. If you were prescribed Percocet or Vicodin, do not take these with acetaminophen (Tylenol) as it is already contained within these medications. °  °Opioid pain medications (or "narcotics") can be habit forming.  Use it as little as possible to achieve adequate pain control.  Do not use or use it with extreme caution if you have a history of opiate abuse or dependence.  If you are on a pain contract with your primary care doctor or a pain specialist, be sure to let them know you were prescribed this medication today from the [facility].  This medication is intended for your use only - do not give any to anyone else and keep it in a secure place where nobody else, especially children and pets, have access to it.  It will also cause or worsen constipation, so you may want to consider taking an over-the-counter stool softener while you are taking this medication. ° °Abdominal Pain, Adult °Many things can cause abdominal pain. Usually, abdominal pain is not caused by a disease and will improve without treatment. It can often be observed and treated at home. Your health care provider will do a physical exam and possibly order blood tests and X-rays to help determine the seriousness of your pain. However, in many cases, more time must pass before a clear cause of the pain can be found. Before that point, your health care provider may not know if you need more testing or further treatment. °HOME CARE INSTRUCTIONS °Monitor your abdominal pain for any changes. The following actions may help to alleviate any discomfort you are experiencing: °· Only take over-the-counter or prescription  medicines as directed by your health care provider. °· Do not take laxatives unless directed to do so by your health care provider. °· Try a clear liquid diet (broth, tea, or water) as directed by your health care provider. Slowly move to a bland diet as tolerated. °SEEK MEDICAL CARE IF: °· You have unexplained abdominal pain. °· You have abdominal pain associated with nausea or diarrhea. °· You have pain when you urinate or have a bowel movement. °· You experience abdominal pain that wakes you in the night. °· You have abdominal pain that is worsened or improved by eating food. °· You have abdominal pain that is worsened with eating fatty foods. °· You have a fever. °SEEK IMMEDIATE MEDICAL CARE IF: °· Your pain does not go away within 2 hours. °· You keep throwing up (vomiting). °· Your pain is felt only in portions of the abdomen, such as the right side or the left lower portion of the abdomen. °· You pass bloody or black tarry stools. °MAKE SURE YOU: °· Understand these instructions. °· Will watch your condition. °· Will get help right away if you are not doing well or get worse. °  °This information is not intended to replace advice given to you by your health care provider. Make sure you discuss any questions you have with your health care provider. °  °Document Released: 02/05/2005 Document Revised: 01/17/2015 Document Reviewed: 01/05/2013 °Elsevier Interactive Patient Education ©2016 [employer] ° °Diverticulitis °Diverticulitis is inflammation or infection of small pouches in your colon that form when you have a condition called diverticulosis.   The pouches in your colon are called diverticula. Your colon, or large intestine, is where water is absorbed and stool is formed. °Complications of diverticulitis can include: °· Bleeding. °· Severe infection. °· Severe pain. °· Perforation of your colon. °· Obstruction of your colon. °CAUSES  °Diverticulitis is caused by bacteria. °Diverticulitis happens when stool  becomes trapped in diverticula. This allows bacteria to grow in the diverticula, which can lead to inflammation and infection. °RISK FACTORS °People with diverticulosis are at risk for diverticulitis. Eating a diet that does not include enough fiber from fruits and vegetables may make diverticulitis more likely to develop. °SYMPTOMS  °Symptoms of diverticulitis may include: °· Abdominal pain and tenderness. The pain is normally located on the left side of the abdomen, but may occur in other areas. °· Fever and chills. °· Bloating. °· Cramping. °· Nausea. °· Vomiting. °· Constipation. °· Diarrhea. °· Blood in your stool. °DIAGNOSIS  °Your health care provider will ask you about your medical history and do a physical exam. You may need to have tests done because many medical conditions can cause the same symptoms as diverticulitis. Tests may include: °· Blood tests. °· Urine tests. °· Imaging tests of the abdomen, including X-rays and CT scans. °When your condition is under control, your health care provider may recommend that you have a colonoscopy. A colonoscopy can show how severe your diverticula are and whether something else is causing your symptoms. °TREATMENT  °Most cases of diverticulitis are mild and can be treated at home. Treatment may include: °· Taking over-the-counter pain medicines. °· Following a clear liquid diet. °· Taking antibiotic medicines by mouth for 7-10 days. °More severe cases may be treated at a hospital. Treatment may include: °· Not eating or drinking. °· Taking prescription pain medicine. °· Receiving antibiotic medicines through an IV tube. °· Receiving fluids and nutrition through an IV tube. °· Surgery. °HOME CARE INSTRUCTIONS  °· Follow your health care provider's instructions carefully. °· Follow a full liquid diet or other diet as directed by your health care provider. After your symptoms improve, your health care provider may tell you to change your diet. He or she may recommend  you eat a high-fiber diet. Fruits and vegetables are good sources of fiber. Fiber makes it easier to pass stool. °· Take fiber supplements or probiotics as directed by your health care provider. °· Only take medicines as directed by your health care provider. °· Keep all your follow-up appointments. °SEEK MEDICAL CARE IF:  °· Your pain does not improve. °· You have a hard time eating food. °· Your bowel movements do not return to normal. °SEEK IMMEDIATE MEDICAL CARE IF:  °· Your pain becomes worse. °· Your symptoms do not get better. °· Your symptoms suddenly get worse. °· You have a fever. °· You have repeated vomiting. °· You have bloody or black, tarry stools. °MAKE SURE YOU:  °· Understand these instructions. °· Will watch your condition. °· Will get help right away if you are not doing well or get worse. °  °This information is not intended to replace advice given to you by your health care provider. Make sure you discuss any questions you have with your health care provider. °  °Document Released: 02/05/2005 Document Revised: 05/03/2013 Document Reviewed: 03/23/2013 °Elsevier Interactive Patient Education ©2016 [employer] Inc[employer] ° °

## 2015-07-04 ENCOUNTER — Telehealth: Payer: Self-pay | Admitting: Gastroenterology

## 2015-07-04 ENCOUNTER — Telehealth: Payer: Self-pay | Admitting: Surgery

## 2015-07-04 NOTE — Telephone Encounter (Signed)
Patient would like to speak with the nurse

## 2015-07-04 NOTE — Telephone Encounter (Signed)
Patient saw Dr Azalee Course in the office on 05/18/15 for her first occurrence of acute diverticulitis. Dr Azalee Course is having her follow up with GI - Dr Allen Norris - for a colonoscopy. Colonoscopy is scheduled for March 7th. Patient was seen in the emergency room for another "attack of diverticulitis" on Sunday 07/01/15. She was not admitted and was sent home and states she is not any better. She is requesting to speak with a nurse. Please call and advise. Thanks.

## 2015-07-04 NOTE — Telephone Encounter (Signed)
Returned phone call to patient at this time. She reports nonradiating BLQ Abdominal Pain, nausea but no vomiting, flashes and chills but has not taken her temp. Last bowel movement was on Sunday 07/01/15.   Reviewed labs, CT Scan, and current symptoms with Dr. Azalee Course.  Order to get constipation under control and see if this does not help pain and she would like patient to continue Antibiotics as prescribed.   Informed patient that they should increase water intake and activity level as much as possible to help with bowel movement. Also educated that they may use Miralax over the counter x 2 doses, 6 hours apart, followed by Dulcolax x 2 doses, 6 hours apart until they have a successful bowel movement. Asked patient to call back for further instructions if after taking both doses of these medications, they are still unsuccessful with a bowel movement.  Patient verbalizes understanding of this information. Encouraged to call back with any other questions or if not feeling better after having normal bowel movements.

## 2015-07-07 ENCOUNTER — Encounter: Payer: Self-pay | Admitting: Emergency Medicine

## 2015-07-07 ENCOUNTER — Emergency Department: Payer: 59

## 2015-07-07 ENCOUNTER — Inpatient Hospital Stay
Admission: EM | Admit: 2015-07-07 | Discharge: 2015-07-10 | DRG: 392 | Disposition: A | Discharge status: Home / Self Care | Payer: 59 | Attending: General Surgery | Admitting: General Surgery

## 2015-07-07 DIAGNOSIS — Z96641 Presence of right artificial hip joint: Secondary | ICD-10-CM | POA: Diagnosis present

## 2015-07-07 DIAGNOSIS — E039 Hypothyroidism, unspecified: Secondary | ICD-10-CM | POA: Diagnosis present

## 2015-07-07 DIAGNOSIS — Z79899 Other long term (current) drug therapy: Secondary | ICD-10-CM

## 2015-07-07 DIAGNOSIS — K5780 Diverticulitis of intestine, part unspecified, with perforation and abscess without bleeding: Principal | ICD-10-CM | POA: Diagnosis present

## 2015-07-07 DIAGNOSIS — G4730 Sleep apnea, unspecified: Secondary | ICD-10-CM | POA: Diagnosis present

## 2015-07-07 DIAGNOSIS — Z809 Family history of malignant neoplasm, unspecified: Secondary | ICD-10-CM | POA: Diagnosis not present

## 2015-07-07 DIAGNOSIS — Z9049 Acquired absence of other specified parts of digestive tract: Secondary | ICD-10-CM

## 2015-07-07 DIAGNOSIS — Z823 Family history of stroke: Secondary | ICD-10-CM | POA: Diagnosis not present

## 2015-07-07 DIAGNOSIS — Z90710 Acquired absence of both cervix and uterus: Secondary | ICD-10-CM

## 2015-07-07 DIAGNOSIS — Z7982 Long term (current) use of aspirin: Secondary | ICD-10-CM

## 2015-07-07 DIAGNOSIS — Z885 Allergy status to narcotic agent status: Secondary | ICD-10-CM

## 2015-07-07 DIAGNOSIS — M5136 Other intervertebral disc degeneration, lumbar region: Secondary | ICD-10-CM | POA: Diagnosis present

## 2015-07-07 DIAGNOSIS — Z825 Family history of asthma and other chronic lower respiratory diseases: Secondary | ICD-10-CM

## 2015-07-07 DIAGNOSIS — K5720 Diverticulitis of large intestine with perforation and abscess without bleeding: Secondary | ICD-10-CM | POA: Diagnosis not present

## 2015-07-07 DIAGNOSIS — K5792 Diverticulitis of intestine, part unspecified, without perforation or abscess without bleeding: Secondary | ICD-10-CM

## 2015-07-07 DIAGNOSIS — K219 Gastro-esophageal reflux disease without esophagitis: Secondary | ICD-10-CM | POA: Diagnosis present

## 2015-07-07 DIAGNOSIS — Z888 Allergy status to other drugs, medicaments and biological substances status: Secondary | ICD-10-CM

## 2015-07-07 DIAGNOSIS — K651 Peritoneal abscess: Secondary | ICD-10-CM

## 2015-07-07 DIAGNOSIS — Z9889 Other specified postprocedural states: Secondary | ICD-10-CM | POA: Diagnosis not present

## 2015-07-07 HISTORY — DX: Diverticulitis of intestine, part unspecified, without perforation or abscess without bleeding: K57.92

## 2015-07-07 LAB — COMPREHENSIVE METABOLIC PANEL
ALT: 20 U/L (ref 14–54)
ANION GAP: 9 (ref 5–15)
AST: 30 U/L (ref 15–41)
Albumin: 3.1 g/dL — ABNORMAL LOW (ref 3.5–5.0)
Alkaline Phosphatase: 57 U/L (ref 38–126)
BUN: 12 mg/dL (ref 6–20)
CALCIUM: 8.7 mg/dL — AB (ref 8.9–10.3)
CHLORIDE: 103 mmol/L (ref 101–111)
CO2: 25 mmol/L (ref 22–32)
CREATININE: 0.84 mg/dL (ref 0.44–1.00)
Glucose, Bld: 105 mg/dL — ABNORMAL HIGH (ref 65–99)
Potassium: 3.5 mmol/L (ref 3.5–5.1)
Sodium: 137 mmol/L (ref 135–145)
Total Bilirubin: 0.6 mg/dL (ref 0.3–1.2)
Total Protein: 6.6 g/dL (ref 6.5–8.1)

## 2015-07-07 LAB — CBC
HCT: 35.5 % (ref 35.0–47.0)
HEMOGLOBIN: 12 g/dL (ref 12.0–16.0)
MCH: 28.4 pg (ref 26.0–34.0)
MCHC: 33.9 g/dL (ref 32.0–36.0)
MCV: 83.7 fL (ref 80.0–100.0)
PLATELETS: 417 10*3/uL (ref 150–440)
RBC: 4.25 MIL/uL (ref 3.80–5.20)
RDW: 14.3 % (ref 11.5–14.5)
WBC: 14.6 10*3/uL — AB (ref 3.6–11.0)

## 2015-07-07 LAB — URINALYSIS COMPLETE WITH MICROSCOPIC (ARMC ONLY)
Bacteria, UA: NONE SEEN
Bilirubin Urine: NEGATIVE
Glucose, UA: NEGATIVE mg/dL
KETONES UR: NEGATIVE mg/dL
Nitrite: NEGATIVE
PH: 6 (ref 5.0–8.0)
PROTEIN: NEGATIVE mg/dL
SPECIFIC GRAVITY, URINE: 1.005 (ref 1.005–1.030)

## 2015-07-07 LAB — LIPASE, BLOOD: LIPASE: 21 U/L (ref 11–51)

## 2015-07-07 MED ORDER — ONDANSETRON 8 MG PO TBDP: 4.0000 mg | ORAL_TABLET | Freq: Four times a day (QID) | ORAL | Status: DC | PRN

## 2015-07-07 MED ORDER — ONDANSETRON HCL 4 MG/2ML IJ SOLN
4.0000 mg | Freq: Once | INTRAMUSCULAR | Status: AC
  Administered 2015-07-07: 4 mg via INTRAVENOUS
  Filled 2015-07-07: qty 2

## 2015-07-07 MED ORDER — IOHEXOL 300 MG/ML  SOLN: 25.0000 mL | Freq: Once | INTRAMUSCULAR | Status: DC | PRN

## 2015-07-07 MED ORDER — ONDANSETRON HCL 4 MG/2ML IJ SOLN
4.0000 mg | Freq: Four times a day (QID) | INTRAMUSCULAR | Status: DC | PRN
  Administered 2015-07-07 – 2015-07-10 (×3): 4 mg via INTRAVENOUS
  Filled 2015-07-07 (×2): qty 2

## 2015-07-07 MED ORDER — TRAZODONE HCL 100 MG PO TABS: 100.0000 mg | ORAL_TABLET | Freq: Every evening | ORAL | Status: DC | PRN

## 2015-07-07 MED ORDER — HYDROMORPHONE HCL 1 MG/ML IJ SOLN
0.5000 mg | INTRAMUSCULAR | Status: AC
  Administered 2015-07-07: 0.5 mg via INTRAVENOUS
  Filled 2015-07-07: qty 1

## 2015-07-07 MED ORDER — DULOXETINE HCL 30 MG PO CPEP
60.0000 mg | ORAL_CAPSULE | Freq: Every day | ORAL | Status: DC
  Administered 2015-07-07 – 2015-07-10 (×3): 60 mg via ORAL
  Filled 2015-07-07 (×3): qty 2

## 2015-07-07 MED ORDER — PIPERACILLIN-TAZOBACTAM 3.375 G IVPB
3.3750 g | Freq: Three times a day (TID) | INTRAVENOUS | Status: DC
  Administered 2015-07-08 – 2015-07-10 (×8): 3.375 g via INTRAVENOUS
  Filled 2015-07-07 (×9): qty 50

## 2015-07-07 MED ORDER — POTASSIUM CHLORIDE IN NACL 20-0.45 MEQ/L-% IV SOLN
INTRAVENOUS | Status: DC
  Administered 2015-07-07 – 2015-07-10 (×6): via INTRAVENOUS
  Filled 2015-07-07 (×8): qty 1000

## 2015-07-07 MED ORDER — MORPHINE SULFATE (PF) 4 MG/ML IV SOLN
INTRAVENOUS | Status: AC
  Administered 2015-07-07: 4 mg via INTRAVENOUS
  Filled 2015-07-07: qty 1

## 2015-07-07 MED ORDER — IOHEXOL 240 MG/ML SOLN
25.0000 mL | Freq: Once | INTRAMUSCULAR | Status: AC | PRN
  Administered 2015-07-07: 25 mL via ORAL

## 2015-07-07 MED ORDER — SODIUM CHLORIDE 0.9 % IV BOLUS (SEPSIS)
1000.0000 mL | Freq: Once | INTRAVENOUS | Status: AC
  Administered 2015-07-07: 1000 mL via INTRAVENOUS

## 2015-07-07 MED ORDER — MORPHINE SULFATE (PF) 4 MG/ML IV SOLN
4.0000 mg | INTRAVENOUS | Status: DC | PRN
  Administered 2015-07-07 – 2015-07-09 (×6): 4 mg via INTRAVENOUS
  Filled 2015-07-07 (×8): qty 1

## 2015-07-07 MED ORDER — PANTOPRAZOLE SODIUM 40 MG IV SOLR
40.0000 mg | Freq: Every day | INTRAVENOUS | Status: DC
  Administered 2015-07-07 – 2015-07-09 (×3): 40 mg via INTRAVENOUS
  Filled 2015-07-07 (×3): qty 40

## 2015-07-07 MED ORDER — ONDANSETRON HCL 4 MG/2ML IJ SOLN
INTRAMUSCULAR | Status: AC
  Administered 2015-07-07: 4 mg via INTRAVENOUS
  Filled 2015-07-07: qty 2

## 2015-07-07 MED ORDER — PIPERACILLIN-TAZOBACTAM 3.375 G IVPB
3.3750 g | Freq: Once | INTRAVENOUS | Status: AC
  Administered 2015-07-07: 3.375 g via INTRAVENOUS
  Filled 2015-07-07: qty 50

## 2015-07-07 MED ORDER — IOHEXOL 300 MG/ML  SOLN
100.0000 mL | Freq: Once | INTRAMUSCULAR | Status: AC | PRN
  Administered 2015-07-07: 100 mL via INTRAVENOUS

## 2015-07-07 MED ORDER — HYDRALAZINE HCL 20 MG/ML IJ SOLN: 10.0000 mg | INTRAMUSCULAR | Status: DC | PRN

## 2015-07-07 NOTE — ED Provider Notes (Signed)
[facility] Emergency Department Provider Note  ____________________________________________  Time seen: Approximately 4:03 PM  I have reviewed the triage vital signs and the nursing notes.   HISTORY  Chief Complaint Flank Pain    HPI [name] [name] is a 60 y.o. female history of diverticulitis, hypothyroidism, depression and pneumonia.  Patient reports that about a week ago she was diagnosed with diverticulitis, she had a CT scan here and was started on antibiotics. She did on antibiotics now for about 5 days, and saw her doctor this morning. Patient reports that she's had increasing pain over the left side of the abdomen for about the last 2 days with associated nausea but no vomiting. No further loose diarrhea. Denies fever. Patient reports she saw her primary care doctor was concerned she could have a abscess-come emergency room for repeat CAT scan.   Past Medical History  Diagnosis Date  . DDD (degenerative disc disease), lumbar   . Spine malformation     Kiari Malformation  . Thyroid disease   . GERD (gastroesophageal reflux disease)   . Irregular heart rate   . Hypothyroidism   . Depression   . Pneumonia     hx  . Sleep apnea     unable to wear mask-sleep study 3-4 yrs ago  . Diverticulitis     Patient Active Problem List   Diagnosis Date Noted  . Acute diverticulitis 05/18/2015  . Colitis 05/01/2015  . Generalized abdominal pain 01/08/2015  . Obesity 01/08/2015  . Dizziness 01/08/2015  . HYPOTHYROIDISM 11/07/2009  . CALLUSES, LEFT FOOT 11/07/2009  . FASCIITIS, PLANTAR 11/07/2009    Past Surgical History  Procedure Laterality Date  . Tonsillectomy  as child  . Back surgery  1992    l4 and l5  . Craniectomy suboccipital w/ cervical laminectomy / chiari  08  . Carpal tunnel release Bilateral 2012  . Total hip arthroplasty Right 01/12/2013    Procedure: TOTAL HIP ARTHROPLASTY WITH AUTOGRAFT;  Surgeon: Ninetta Lights, MD;  Location:  [place];  Service: Orthopedics;  Laterality: Right;  . Appendectomy  60 years old  . Abdominal hysterectomy  60 years old    Current Outpatient Rx  Name  Route  Sig  Dispense  Refill  . aspirin EC 81 MG tablet   Oral   Take 81 mg by mouth daily.         . calcium-vitamin D (OSCAL WITH D) 500-200 MG-UNIT per tablet   Oral   Take 1 tablet by mouth daily with breakfast.         . ciprofloxacin (CIPRO) 500 MG tablet   Oral   Take 1 tablet (500 mg total) by mouth 2 (two) times daily.   14 tablet   0   . DULoxetine (CYMBALTA) 60 MG capsule   Oral   Take 1 capsule by mouth daily.          . fenofibrate 160 MG tablet   Oral   Take 160 mg by mouth daily.           Marland Kitchen levothyroxine (SYNTHROID, LEVOTHROID) 125 MCG tablet   Oral   Take 125 mcg by mouth daily before breakfast.         . metroNIDAZOLE (FLAGYL) 500 MG tablet   Oral   Take 1 tablet (500 mg total) by mouth 3 (three) times daily.   30 tablet   0   . Multiple Vitamin (MULTIVITAMIN WITH MINERALS) TABS tablet   Oral  Take 1 tablet by mouth daily.         Marland Kitchen omeprazole (PRILOSEC) 20 MG capsule   Oral   Take 20 mg by mouth daily.           . ondansetron (ZOFRAN ODT) 4 MG disintegrating tablet   Oral   Take 1 tablet (4 mg total) by mouth every 8 (eight) hours as needed for nausea or vomiting.   20 tablet   0   . ondansetron (ZOFRAN) 4 MG tablet   Oral   Take 1 tablet (4 mg total) by mouth every 8 (eight) hours as needed for nausea or vomiting.   20 tablet   0   . oxyCODONE-acetaminophen (ROXICET) 5-325 MG tablet   Oral   Take 1 tablet by mouth every 6 (six) hours as needed for severe pain.   12 tablet   0   . traZODone (DESYREL) 100 MG tablet   Oral   Take 100 mg by mouth at bedtime as needed.            Allergies Morphine and Diphenhydramine hcl  Family History  Problem Relation Age of Onset  . COPD Mother   . Stroke Mother   . Cancer Father     Social History Social History   Substance Use Topics  . Smoking status: Never Smoker   . Smokeless tobacco: Never Used     Comment: 2004 last crack   occ alcohol  . Alcohol Use: No     Comment: 2 beers a year    Review of Systems Constitutional: No fever/chills Eyes: No visual changes. ENT: No sore throat. Cardiovascular: Denies chest pain. Respiratory: Denies shortness of breath. Gastrointestinal: No vomiting.  No diarrhea.  No constipation. Genitourinary: Negative for dysuria. Musculoskeletal: Negative for back pain. Skin: Negative for rash. Neurological: Negative for headaches, focal weakness or numbness.  10-point ROS otherwise negative.  ____________________________________________   PHYSICAL EXAM:  VITAL SIGNS: ED Triage Vitals  Enc Vitals Group     BP 07/07/15 1511 113/72 mmHg     Pulse Rate 07/07/15 1511 86     Resp 07/07/15 1511 18     Temp 07/07/15 1511 98.2 F (36.8 C)     Temp Source 07/07/15 1511 Oral     SpO2 07/07/15 1511 97 %     Weight 07/07/15 1511 202 lb (91.627 kg)     Height 07/07/15 1511 5\' 5"  (1.651 m)     Head Cir --      Peak Flow --      Pain Score 07/07/15 1512 8     Pain Loc --      Pain Edu? --      Excl. in [place]? --    Constitutional: Alert and oriented. Well appearing and in no acute distress. Eyes: Conjunctivae are normal. PERRL. EOMI. Head: Atraumatic. Nose: No congestion/rhinnorhea. Mouth/Throat: Mucous membranes are moist.  Oropharynx non-erythematous. Neck: No stridor.   Cardiovascular: Normal rate, regular rhythm. Grossly normal heart sounds.  Good peripheral circulation. Respiratory: Normal respiratory effort.  No retractions. Lungs CTAB. Gastrointestinal: Soft but moderate tenderness in the left lower quadrant with fairly severe tenderness and rebound peritonitis in the left upper quadrant.. No distention.  No CVA tenderness. Musculoskeletal: No lower extremity tenderness nor edema.  No joint effusions. Neurologic:  Normal speech and language. No gross  focal neurologic deficits are appreciated. Skin:  Skin is warm, dry and intact. No rash noted. Psychiatric: Mood and affect are normal. Speech and behavior  are normal.  ____________________________________________   LABS (all labs ordered are listed, but only abnormal results are displayed)  Labs Reviewed  COMPREHENSIVE METABOLIC PANEL - Abnormal; Notable for the following:    Glucose, Bld 105 (*)    Calcium 8.7 (*)    Albumin 3.1 (*)    All other components within normal limits  CBC - Abnormal; Notable for the following:    WBC 14.6 (*)    All other components within normal limits  URINALYSIS COMPLETEWITH MICROSCOPIC ([facility] ONLY) - Abnormal; Notable for the following:    Color, Urine YELLOW (*)    APPearance CLEAR (*)    Hgb urine dipstick 2+ (*)    Leukocytes, UA 1+ (*)    Squamous Epithelial / LPF 0-5 (*)    All other components within normal limits  CULTURE, BLOOD (ROUTINE X 2)  CULTURE, BLOOD (ROUTINE X 2)  LIPASE, BLOOD   ____________________________________________  EKG   ____________________________________________  RADIOLOGY  CT Abdomen Pelvis W Contrast (Final result) Result time: 07/07/15 17:11:21   Final result by Rad Results In Interface (07/07/15 17:11:21)   Narrative:   CLINICAL DATA: Worsening left lower quadrant pain and nausea. On antibiotics for diverticulitis. Leukocytosis.  EXAM: CT ABDOMEN AND PELVIS WITH CONTRAST  TECHNIQUE: Multidetector CT imaging of the abdomen and pelvis was performed using the standard protocol following bolus administration of intravenous contrast.  CONTRAST: 144mL OMNIPAQUE IOHEXOL 300 MG/ML SOLN  COMPARISON: 07/01/2015  FINDINGS: Linear areas of subsegmental atelectasis or scarring in the lung bases. Heart is normal size. No effusions.  There is a fluid collection in the left paracolic gutter in the area of prior inflammation, measuring 7 x 4.5 cm most compatible with diverticular abscess. This is  intimately associated with the descending colon. Continued inflammatory stranding in this area. No evidence of bowel obstruction. Stomach and small bowel are decompressed and grossly unremarkable.  Liver, gallbladder, spleen, pancreas, adrenals and kidneys are normal. Aorta is normal caliber.  Prior hysterectomy. No adnexal masses. Urinary bladder is decompressed.  Prior right hip replacement. No acute bony abnormality. Postoperative changes in the lower lumbar spine.  IMPRESSION: Development of a pericolonic abscess intimately associated with the descending colon measuring up to 7 cm.  Bibasilar atelectasis.    ____________________________________________   PROCEDURES  Procedure(s) performed: None  Critical Care performed: No  ____________________________________________   INITIAL IMPRESSION / ASSESSMENT AND PLAN / ED COURSE  Pertinent labs & imaging results that were available during my care of the patient were reviewed by me and considered in my medical decision making (see chart for details).  Patient presents with worsening pain from a diagnosis of diverticulitis. She does have fairly severe and focal left upper quadrant tenderness. The setting of a recent diagnosis of diverticulitis, ongoing pain and leukocytosis I'm concerned about failed outpatient therapy as well as the possibility of perforation or abscess formation, also consider other etiologies such as abdominal perforation, perforated ulcer, etc. Based upon her repeat presentation, ongoing pain and noted leukocytosis we will obtain repeat CT imaging here. Control pain with Dilaudid, Zofran, hydrate well and reevaluate.  ----------------------------------------- 5:54 PM on 07/07/2015 -----------------------------------------  CT reveals pericolonic abscess. Discussed with Dr. Adonis Huguenin, plan to admit. Advises start IV Zosyn. Patient updated and agreeable with plan. Resting comfortably at this  time. ____________________________________________   FINAL CLINICAL IMPRESSION(S) / ED DIAGNOSES  Final diagnoses:  Pericolonic abscess due to diverticulitis      Delman Kitten, MD 07/07/15 1754

## 2015-07-07 NOTE — H&P (Signed)
Patient ID: [name] [name], female   DOB: 11-May-1956, 60 y.o.   MRN: [id number]  CC: ABDOMINAL PAIN  HPI [name] [name] is a 60 y.o. female but is known to the surgery department returns to the emergency room with recurrent left lower quadrant abdominal pain. Patient states this is similar to her previous bouts of diverticulitis however has worsened over the last week. She states that due to the pain she's had a decreased appetite but denies any fevers or chills. She has had nausea but no vomiting. She is otherwise in her usual state of health.  HPI  Past Medical History  Diagnosis Date  . DDD (degenerative disc disease), lumbar   . Spine malformation     Kiari Malformation  . Thyroid disease   . GERD (gastroesophageal reflux disease)   . Irregular heart rate   . Hypothyroidism   . Depression   . Pneumonia     hx  . Sleep apnea     unable to wear mask-sleep study 3-4 yrs ago  . Diverticulitis     Past Surgical History  Procedure Laterality Date  . Tonsillectomy  as child  . Back surgery  1992    l4 and l5  . Craniectomy suboccipital w/ cervical laminectomy / chiari  08  . Carpal tunnel release Bilateral 2012  . Total hip arthroplasty Right 01/12/2013    Procedure: TOTAL HIP ARTHROPLASTY WITH AUTOGRAFT;  Surgeon: Ninetta Lights, MD;  Location: [place];  Service: Orthopedics;  Laterality: Right;  . Appendectomy  60 years old  . Abdominal hysterectomy  60 years old    Family History  Problem Relation Age of Onset  . COPD Mother   . Stroke Mother   . Cancer Father     Social History Social History  Substance Use Topics  . Smoking status: Never Smoker   . Smokeless tobacco: Never Used     Comment: 2004 last crack   occ alcohol  . Alcohol Use: No     Comment: 2 beers a year    Allergies  Allergen Reactions  . Morphine     REACTION: Bradycardia, Hypotension  . Diphenhydramine Hcl Palpitations    Current Facility-Administered Medications  Medication Dose Route  Frequency Provider Last Rate Last Dose  . piperacillin-tazobactam (ZOSYN) IVPB 3.375 g  3.375 g Intravenous Once Delman Kitten, MD       Current Outpatient Prescriptions  Medication Sig Dispense Refill  . aspirin EC 81 MG tablet Take 81 mg by mouth daily.    . calcium-vitamin D (OSCAL WITH D) 500-200 MG-UNIT per tablet Take 1 tablet by mouth daily with breakfast.    . ciprofloxacin (CIPRO) 500 MG tablet Take 1 tablet (500 mg total) by mouth 2 (two) times daily. 14 tablet 0  . DULoxetine (CYMBALTA) 60 MG capsule Take 1 capsule by mouth daily.     . fenofibrate 160 MG tablet Take 160 mg by mouth daily.      Marland Kitchen levothyroxine (SYNTHROID, LEVOTHROID) 125 MCG tablet Take 125 mcg by mouth daily before breakfast.    . metroNIDAZOLE (FLAGYL) 500 MG tablet Take 1 tablet (500 mg total) by mouth 3 (three) times daily. 30 tablet 0  . Multiple Vitamin (MULTIVITAMIN WITH MINERALS) TABS tablet Take 1 tablet by mouth daily.    Marland Kitchen omeprazole (PRILOSEC) 20 MG capsule Take 20 mg by mouth daily.      . ondansetron (ZOFRAN ODT) 4 MG disintegrating tablet Take 1 tablet (4 mg total) by mouth  every 8 (eight) hours as needed for nausea or vomiting. 20 tablet 0  . traZODone (DESYREL) 100 MG tablet Take 100 mg by mouth at bedtime as needed for sleep.     Marland Kitchen ondansetron (ZOFRAN) 4 MG tablet Take 1 tablet (4 mg total) by mouth every 8 (eight) hours as needed for nausea or vomiting. 20 tablet 0  . oxyCODONE-acetaminophen (ROXICET) 5-325 MG tablet Take 1 tablet by mouth every 6 (six) hours as needed for severe pain. 12 tablet 0     Review of Systems A multi-point review of systems was asked and was negative except for the findings documented in the history of present illness  Physical Exam Blood pressure 108/65, pulse 87, temperature 98.2 F (36.8 C), temperature source Oral, resp. rate 18, height 5\' 5"  (1.651 m), weight 91.627 kg (202 lb), SpO2 98 %. CONSTITUTIONAL: No acute distress. EYES: Pupils are equal, round, and  reactive to light, Sclera are non-icteric. EARS, NOSE, MOUTH AND THROAT: The oropharynx is clear. The oral mucosa is pink and moist. Hearing is intact to voice. LYMPH NODES:  Lymph nodes in the neck are normal. RESPIRATORY:  Lungs are clear. There is normal respiratory effort, with equal breath sounds bilaterally, and without pathologic use of accessory muscles. CARDIOVASCULAR: Heart is regular without murmurs, gallops, or rubs. GI: The abdomen is soft, tender to palpation in the left lower quadrant but without rebound or tympany, and nondistended. There are no palpable masses. There is no hepatosplenomegaly. There are normal bowel sounds in all quadrants. GU: Rectal deferred.   MUSCULOSKELETAL: Normal muscle strength and tone. No cyanosis or edema.   SKIN: Turgor is good and there are no pathologic skin lesions or ulcers. NEUROLOGIC: Motor and sensation is grossly normal. Cranial nerves are grossly intact. PSYCH:  Oriented to person, place and time. Affect is normal.  Data Reviewed I reviewed the labs and images. Labs are concerning for leukocytosis of 14.6. CT scan shows inflammation of the descending colon and an abscess medially adjacent to the colon. I have personally reviewed the patient's imaging, laboratory findings and medical records.    Assessment    Diverticulitis with abscess    Plan    60 year old female with recurrent diverticulitis now with an abscess of the descending colon. Discussed the patient the treatment options ranging from surgery to antibiotic. After discussion the options patient elects for admission with IV antibiotics and ask interventional radiology to attempt to drain the abscess percutaneously tomorrow. Patient voiced an ascending the risks of this option and that she may still yet require emergent surgery for this if she does not improve with percutaneous drainage.     Time spent with the patient was 30 minutes, with more than 50% of the time spent in  face-to-face education, counseling and care coordination.     Clayburn Pert, MD FACS General Surgeon 07/07/2015, 6:54 PM

## 2015-07-07 NOTE — ED Notes (Signed)
L flank pain x 6 days. States upper abd pain with palpation.

## 2015-07-08 ENCOUNTER — Inpatient Hospital Stay: Payer: 59

## 2015-07-08 DIAGNOSIS — K5720 Diverticulitis of large intestine with perforation and abscess without bleeding: Secondary | ICD-10-CM | POA: Insufficient documentation

## 2015-07-08 LAB — COMPREHENSIVE METABOLIC PANEL
ALBUMIN: 2.9 g/dL — AB (ref 3.5–5.0)
ALT: 16 U/L (ref 14–54)
AST: 20 U/L (ref 15–41)
Alkaline Phosphatase: 51 U/L (ref 38–126)
Anion gap: 9 (ref 5–15)
BUN: 9 mg/dL (ref 6–20)
CHLORIDE: 106 mmol/L (ref 101–111)
CO2: 23 mmol/L (ref 22–32)
Calcium: 8.3 mg/dL — ABNORMAL LOW (ref 8.9–10.3)
Creatinine, Ser: 0.84 mg/dL (ref 0.44–1.00)
GFR calc Af Amer: >60 mL/min (ref >60)
GFR calc non Af Amer: >60 mL/min (ref >60)
GLUCOSE: 91 mg/dL (ref 65–99)
POTASSIUM: 3.5 mmol/L (ref 3.5–5.1)
SODIUM: 138 mmol/L (ref 135–145)
Total Bilirubin: 0.6 mg/dL (ref 0.3–1.2)
Total Protein: 5.7 g/dL — ABNORMAL LOW (ref 6.5–8.1)

## 2015-07-08 LAB — CBC
HCT: 34.2 % — ABNORMAL LOW (ref 35.0–47.0)
HEMOGLOBIN: 11.3 g/dL — AB (ref 12.0–16.0)
MCH: 28.5 pg (ref 26.0–34.0)
MCHC: 33 g/dL (ref 32.0–36.0)
MCV: 86.3 fL (ref 80.0–100.0)
Platelets: 401 10*3/uL (ref 150–440)
RBC: 3.97 MIL/uL (ref 3.80–5.20)
RDW: 14.1 % (ref 11.5–14.5)
WBC: 13.2 10*3/uL — ABNORMAL HIGH (ref 3.6–11.0)

## 2015-07-08 LAB — MAGNESIUM: Magnesium: 1.7 mg/dL (ref 1.7–2.4)

## 2015-07-08 LAB — PHOSPHORUS: Phosphorus: 4.3 mg/dL (ref 2.5–4.6)

## 2015-07-08 LAB — PROTIME-INR
INR: 1.28
Prothrombin Time: 16.1 seconds — ABNORMAL HIGH (ref 11.4–15.0)

## 2015-07-08 LAB — APTT: APTT: 27 s (ref 24–36)

## 2015-07-08 MED ORDER — FENTANYL CITRATE (PF) 100 MCG/2ML IJ SOLN
INTRAMUSCULAR | Status: AC
  Filled 2015-07-08: qty 4

## 2015-07-08 MED ORDER — ONDANSETRON HCL 4 MG/2ML IJ SOLN
INTRAMUSCULAR | Status: AC
  Filled 2015-07-08: qty 2

## 2015-07-08 MED ORDER — KETOROLAC TROMETHAMINE 30 MG/ML IJ SOLN
30.0000 mg | Freq: Four times a day (QID) | INTRAMUSCULAR | Status: DC
  Administered 2015-07-09 – 2015-07-10 (×8): 30 mg via INTRAVENOUS
  Filled 2015-07-08 (×9): qty 1

## 2015-07-08 MED ORDER — FENTANYL CITRATE (PF) 100 MCG/2ML IJ SOLN
INTRAMUSCULAR | Status: AC | PRN
  Administered 2015-07-08: 25 ug via INTRAVENOUS

## 2015-07-08 MED ORDER — ENOXAPARIN SODIUM 40 MG/0.4ML ~~LOC~~ SOLN
40.0000 mg | SUBCUTANEOUS | Status: DC
  Administered 2015-07-09 – 2015-07-10 (×2): 40 mg via SUBCUTANEOUS
  Filled 2015-07-08 (×2): qty 0.4

## 2015-07-08 MED ORDER — HYDROCODONE-ACETAMINOPHEN 5-325 MG PO TABS
1.0000 | ORAL_TABLET | ORAL | Status: DC | PRN
Start: 2015-07-08 — End: 2015-07-10
  Administered 2015-07-08 – 2015-07-09 (×3): 2 via ORAL
  Administered 2015-07-10: 1 via ORAL
  Filled 2015-07-08 (×3): qty 2
  Filled 2015-07-08 (×2): qty 1

## 2015-07-08 MED ORDER — MIDAZOLAM HCL 5 MG/5ML IJ SOLN
INTRAMUSCULAR | Status: AC
  Filled 2015-07-08: qty 10

## 2015-07-08 MED ORDER — MIDAZOLAM HCL 2 MG/2ML IJ SOLN
INTRAMUSCULAR | Status: AC | PRN
  Administered 2015-07-08: 2 mg via INTRAVENOUS

## 2015-07-08 NOTE — Sedation Documentation (Signed)
VS as recorded on flowsheet but as follow: evry 5 min. Stating [id number]: 113/60   92 SR   17    93% ETCO2; 35 172o 97/57    88   16   94%   Etco2; 37 1725:109/57    88   18   34  36etco2 sat 96% 1730109/57   90 21 95% 34 etco2 1735: 106/50   90   24   95%  Etco2: 39 1

## 2015-07-08 NOTE — Sedation Documentation (Signed)
LOC every 5 min and pai level as follow  1715: 5    A&O 1720 5   A&O 1725 5   Alert 17305 5   alert 1735 5  alet

## 2015-07-08 NOTE — Sedation Documentation (Signed)
DR. Jacqualyn Posey in and discuss in detail to pt procedure.at 510PM. Pt consented.

## 2015-07-08 NOTE — Progress Notes (Signed)
CC: Diverticulitis Subjective: Patient reports feeling better this morning than on admission. She denies any fevers, chills. She continues to have some pain in the left lower quadrant but it is improved from admission.  Objective: Vital signs in last 24 hours: Temp:  [98 F (36.7 C)-98.6 F (37 C)] 98 F (36.7 C) (02/26 0442) Pulse Rate:  [84-88] 87 (02/26 0442) Resp:  [16-18] 18 (02/26 0442) BP: (103-113)/(50-72) 104/60 mmHg (02/26 0442) SpO2:  [93 %-98 %] 94 % (02/26 0442) Weight:  [91.627 kg (202 lb)] 91.627 kg (202 lb) (02/25 1511) Last BM Date: 07/07/15  Intake/Output from previous day: 02/25 0701 - 02/26 0700 In: 1770 [I.V.:749; IV Piggyback:1021] Out: 400 [Urine:400] Intake/Output this shift: Total I/O In: 362 [I.V.:339; IV Piggyback:23] Out: 150 [Urine:150]  Physical exam:  Gen.: No acute distress Chest: Clear to auscultation Heart: Regular rate and rhythm Abdomen: Soft, nondistended, tender to palpation left lower quadrant.  Lab Results: CBC   Recent Labs  07/07/15 1521 07/08/15 0519  WBC 14.6* 13.2*  HGB 12.0 11.3*  HCT 35.5 34.2*  PLT 417 401   BMET  Recent Labs  07/07/15 1521 07/08/15 0519  NA 137 138  K 3.5 3.5  CL 103 106  CO2 25 23  GLUCOSE 105* 91  BUN 12 9  CREATININE 0.84 0.84  CALCIUM 8.7* 8.3*   PT/INR  Recent Labs  07/08/15 0519  LABPROT 16.1*  INR 1.28   ABG No results for input(s): PHART, HCO3 in the last 72 hours.  Invalid input(s): PCO2, PO2  Studies/Results: Ct Abdomen Pelvis W Contrast  07/07/2015  CLINICAL DATA:  Worsening left lower quadrant pain and nausea. On antibiotics for diverticulitis. Leukocytosis. EXAM: CT ABDOMEN AND PELVIS WITH CONTRAST TECHNIQUE: Multidetector CT imaging of the abdomen and pelvis was performed using the standard protocol following bolus administration of intravenous contrast. CONTRAST:  158mL OMNIPAQUE IOHEXOL 300 MG/ML  SOLN COMPARISON:  07/01/2015 FINDINGS: Linear areas of  subsegmental atelectasis or scarring in the lung bases. Heart is normal size. No effusions. There is a fluid collection in the left paracolic gutter in the area of prior inflammation, measuring 7 x 4.5 cm most compatible with diverticular abscess. This is intimately associated with the descending colon. Continued inflammatory stranding in this area. No evidence of bowel obstruction. Stomach and small bowel are decompressed and grossly unremarkable. Liver, gallbladder, spleen, pancreas, adrenals and kidneys are normal. Aorta is normal caliber. Prior hysterectomy. No adnexal masses. Urinary bladder is decompressed. Prior right hip replacement. No acute bony abnormality. Postoperative changes in the lower lumbar spine. IMPRESSION: Development of a pericolonic abscess intimately associated with the descending colon measuring up to 7 cm. Bibasilar atelectasis. Electronically Signed   By: Rolm Baptise M.D.   On: 07/07/2015 17:11    Anti-infectives: Anti-infectives    Start     Dose/Rate Route Frequency Ordered Stop   07/07/15 1800  piperacillin-tazobactam (ZOSYN) IVPB 3.375 g     3.375 g 12.5 mL/hr over 240 Minutes Intravenous  Once 07/07/15 1753 07/08/15 0020   07/07/15 0400  piperacillin-tazobactam (ZOSYN) IVPB 3.375 g     3.375 g 12.5 mL/hr over 240 Minutes Intravenous 3 times per day 07/07/15 2052        Assessment/Plan:  60 year old female admitted to surgery with a diverticular abscess. She is on IV antibiotics. Discussed with radiology who will perform a percutaneous drainage of this later today. Patient will remain in the hospital on IV antibiotics until her pain is resolved and her labs are  normal. Keep patient nothing by mouth until after drain placement. We'll not start pharmacologic DVT prophylaxis until after drain placement.  Analiese Krupka T. Adonis Huguenin, MD, FACS  07/08/2015

## 2015-07-08 NOTE — Procedures (Signed)
Interventional Radiology Procedure Note  Procedure: CT guided drainage of left abd abscess, associated with diverticular dz.  27F drain placed.  60cc aspirated for culture.  Purulent fluid  Complications: None Recommendations:  - record output per shift - BID sterile flush - follow up culture - Do not submerge   - Routine care   Signed,  Dulcy Fanny. Earleen Newport, DO

## 2015-07-09 LAB — BASIC METABOLIC PANEL
ANION GAP: 4 — AB (ref 5–15)
BUN: 8 mg/dL (ref 6–20)
CALCIUM: 8.1 mg/dL — AB (ref 8.9–10.3)
CO2: 26 mmol/L (ref 22–32)
CREATININE: 0.98 mg/dL (ref 0.44–1.00)
Chloride: 107 mmol/L (ref 101–111)
GFR calc non Af Amer: >60 mL/min (ref >60)
Glucose, Bld: 96 mg/dL (ref 65–99)
Potassium: 4.3 mmol/L (ref 3.5–5.1)
SODIUM: 137 mmol/L (ref 135–145)

## 2015-07-09 LAB — CBC
HEMATOCRIT: 31.2 % — AB (ref 35.0–47.0)
HEMOGLOBIN: 10.3 g/dL — AB (ref 12.0–16.0)
MCH: 28.5 pg (ref 26.0–34.0)
MCHC: 33 g/dL (ref 32.0–36.0)
MCV: 86.4 fL (ref 80.0–100.0)
Platelets: 386 10*3/uL (ref 150–440)
RBC: 3.61 MIL/uL — ABNORMAL LOW (ref 3.80–5.20)
RDW: 14.2 % (ref 11.5–14.5)
WBC: 11.3 10*3/uL — AB (ref 3.6–11.0)

## 2015-07-09 LAB — PHOSPHORUS: PHOSPHORUS: 4.3 mg/dL (ref 2.5–4.6)

## 2015-07-09 LAB — MAGNESIUM: MAGNESIUM: 1.8 mg/dL (ref 1.7–2.4)

## 2015-07-09 MED ORDER — SODIUM CHLORIDE 0.9% FLUSH
10.0000 mL | Freq: Three times a day (TID) | INTRAVENOUS | Status: DC
  Administered 2015-07-09: 5 mL

## 2015-07-09 MED ORDER — SODIUM CHLORIDE 0.9% FLUSH
5.0000 mL | Freq: Three times a day (TID) | INTRAVENOUS | Status: DC
  Administered 2015-07-09 – 2015-07-10 (×3): 5 mL

## 2015-07-09 NOTE — Progress Notes (Signed)
CC: Diverticular abscess, with recurrent diverticulitis Subjective: Status post drain placement by IR Feels better as as compared to yesterday. mild diffuse intermittent abdominal pain Tolerating by mouth and she is hungry  Objective: Vital signs in last 24 hours: Temp:  [97.9 F (36.6 C)-99.1 F (37.3 C)] 98.1 F (36.7 C) (02/27 1100) Pulse Rate:  [65-93] 65 (02/27 1100) Resp:  [14-26] 18 (02/27 1100) BP: (81-104)/(48-73) 101/63 mmHg (02/27 1100) SpO2:  [91 %-100 %] 96 % (02/27 1100) Last BM Date: 07/07/15  Intake/Output from previous day: 02/26 0701 - 02/27 0700 In: 2052 [I.V.:1954; IV Piggyback:98] Out: [id number] [Urine:1500; Drains:55] Intake/Output this shift: Total I/O In: 1602 [P.O.:700; I.V.:850; IV Piggyback:52] Out: [id number] [Urine:1350; Drains:20]  Physical exam: He is in no acute distress awake alert Chest: Clear to auscultation, S1-S2 normal sinus rhythm Abdomen: Soft very mild diffuse tenderness to palpation left lower quadrant, draining with some purulent fluid. No peritonitis Extremities: No edema well perfused   Lab Results: CBC   Recent Labs  07/08/15 0519 07/09/15 0411  WBC 13.2* 11.3*  HGB 11.3* 10.3*  HCT 34.2* 31.2*  PLT 401 386   BMET  Recent Labs  07/08/15 0519 07/09/15 0411  NA 138 137  K 3.5 4.3  CL 106 107  CO2 23 26  GLUCOSE 91 96  BUN 9 8  CREATININE 0.84 0.98  CALCIUM 8.3* 8.1*   PT/INR  Recent Labs  07/08/15 0519  LABPROT 16.1*  INR 1.28   ABG No results for input(s): PHART, HCO3 in the last 72 hours.  Invalid input(s): PCO2, PO2  Studies/Results: Ct Abdomen Pelvis W Contrast  07/07/2015  CLINICAL DATA:  Worsening left lower quadrant pain and nausea. On antibiotics for diverticulitis. Leukocytosis. EXAM: CT ABDOMEN AND PELVIS WITH CONTRAST TECHNIQUE: Multidetector CT imaging of the abdomen and pelvis was performed using the standard protocol following bolus administration of intravenous contrast. CONTRAST:  18mL  OMNIPAQUE IOHEXOL 300 MG/ML  SOLN COMPARISON:  07/01/2015 FINDINGS: Linear areas of subsegmental atelectasis or scarring in the lung bases. Heart is normal size. No effusions. There is a fluid collection in the left paracolic gutter in the area of prior inflammation, measuring 7 x 4.5 cm most compatible with diverticular abscess. This is intimately associated with the descending colon. Continued inflammatory stranding in this area. No evidence of bowel obstruction. Stomach and small bowel are decompressed and grossly unremarkable. Liver, gallbladder, spleen, pancreas, adrenals and kidneys are normal. Aorta is normal caliber. Prior hysterectomy. No adnexal masses. Urinary bladder is decompressed. Prior right hip replacement. No acute bony abnormality. Postoperative changes in the lower lumbar spine. IMPRESSION: Development of a pericolonic abscess intimately associated with the descending colon measuring up to 7 cm. Bibasilar atelectasis. Electronically Signed   By: Rolm Baptise M.D.   On: 07/07/2015 17:11   Ct Image Guided Drainage Percut Cath  Peritoneal Retroperit  07/08/2015  INDICATION: 60 year old female with a history of diverticular abscess. EXAM: CT GUIDED DRAINAGE OF LEFT ABDOMINAL DIVERTICULAR ABSCESS MEDICATIONS: The patient is currently admitted to the hospital and receiving intravenous antibiotics. The antibiotics were administered within an appropriate time frame prior to the initiation of the procedure. ANESTHESIA/SEDATION: 1.0 mg IV Versed 50 mcg IV Fentanyl Moderate Sedation Time:  20 The patient was continuously monitored during the procedure by the interventional radiology nurse under my direct supervision. COMPLICATIONS: None TECHNIQUE: Informed written consent was obtained from the patient after a thorough discussion of the procedural risks, benefits and alternatives. All questions were addressed. Maximal Sterile Barrier Technique  was utilized including caps, mask, sterile gowns, sterile  gloves, sterile drape, hand hygiene and skin antiseptic. A timeout was performed prior to the initiation of the procedure. PROCEDURE: The left abdomen was prepped with chlorhexidine in a sterile fashion, and a sterile drape was applied covering the operative field. A sterile gown and sterile gloves were used for the procedure. Local anesthesia was provided with 1% Lidocaine. Once the patient is prepped and draped in the usual sterile fashion, the skin and subcutaneous tissues were generously infiltrated 1% lidocaine for local anesthesia. A small stab incision was made with 11 blade scalpel, and using CT guidance, an 18 gauge trocar needle was advanced into fluid and gas collection in the left pericolic gutter. Once we confirmed needle tip placement, modified Seldinger technique was used to place a 10 French pigtail catheter drain. Approximately 60 cc of frankly purulent material was aspirated for a sample. Catheter was sutured in position attached to gravity bag. Patient tolerated the procedure well and remained hemodynamically stable throughout. No complications were encountered and no significant blood loss was encountered. FINDINGS: CT demonstrates left pericolic gutter fluid and gas collection, adjacent to descending colon. Final image demonstrates safe placement of 10 French pigtail catheter into the collection. IMPRESSION: Status post CT-guided drainage of left pericolic abscess. Sample sent to the lab for analysis. Signed, Dulcy Fanny. Earleen Newport, DO [facility] Radiology Electronically Signed   By: Corrie Mckusick D.O.   On: 07/08/2015 19:48    Anti-infectives: Anti-infectives    Start     Dose/Rate Route Frequency Ordered Stop   07/07/15 1800  piperacillin-tazobactam (ZOSYN) IVPB 3.375 g     3.375 g 12.5 mL/hr over 240 Minutes Intravenous  Once 07/07/15 1753 07/08/15 0020   07/07/15 0400  piperacillin-tazobactam (ZOSYN) IVPB 3.375 g     3.375 g 12.5 mL/hr  over 240 Minutes Intravenous 3 times per day 07/07/15 2052        Assessment/Plan: Complicated diverticulitis with diverticular abscess improving  Advanced to full liquid diet, and continue IV antibiotics No need for emergent surgical revision. She will benefit from elective sigmoid colectomy after a repeat colonoscopy. D/W with the patient in detail about my thought processes and my recommendations regarding elective sigmoid colectomy she understands Questions were answered, CT scan personally reviewed  Caroleen Hamman, MD, FACS  07/09/2015

## 2015-07-10 LAB — CBC
HCT: 32 % — ABNORMAL LOW (ref 35.0–47.0)
Hemoglobin: 10.9 g/dL — ABNORMAL LOW (ref 12.0–16.0)
MCH: 28.7 pg (ref 26.0–34.0)
MCHC: 34.1 g/dL (ref 32.0–36.0)
MCV: 84.2 fL (ref 80.0–100.0)
PLATELETS: 435 10*3/uL (ref 150–440)
RBC: 3.8 MIL/uL (ref 3.80–5.20)
RDW: 14.3 % (ref 11.5–14.5)
WBC: 8.4 10*3/uL (ref 3.6–11.0)

## 2015-07-10 LAB — BASIC METABOLIC PANEL
ANION GAP: 7 (ref 5–15)
BUN: 9 mg/dL (ref 6–20)
CO2: 24 mmol/L (ref 22–32)
Calcium: 8.4 mg/dL — ABNORMAL LOW (ref 8.9–10.3)
Chloride: 110 mmol/L (ref 101–111)
Creatinine, Ser: 0.95 mg/dL (ref 0.44–1.00)
Glucose, Bld: 95 mg/dL (ref 65–99)
POTASSIUM: 4.4 mmol/L (ref 3.5–5.1)
SODIUM: 141 mmol/L (ref 135–145)

## 2015-07-10 MED ORDER — CIPROFLOXACIN HCL 500 MG PO TABS: 500.0000 mg | ORAL_TABLET | Freq: Two times a day (BID) | ORAL | Status: DC

## 2015-07-10 MED ORDER — HYDROCODONE-ACETAMINOPHEN 5-325 MG PO TABS: 1.0000 | ORAL_TABLET | ORAL | Status: DC | PRN

## 2015-07-10 MED ORDER — METRONIDAZOLE 500 MG PO TABS: 500.0000 mg | ORAL_TABLET | Freq: Three times a day (TID) | ORAL | Status: DC

## 2015-07-10 MED ORDER — PANTOPRAZOLE SODIUM 40 MG PO TBEC
40.0000 mg | DELAYED_RELEASE_TABLET | Freq: Every day | ORAL | Status: DC
  Administered 2015-07-10: 40 mg via ORAL
  Filled 2015-07-10: qty 1

## 2015-07-10 MED ORDER — METRONIDAZOLE 500 MG PO TABS
500.0000 mg | ORAL_TABLET | Freq: Three times a day (TID) | ORAL | Status: DC
  Administered 2015-07-10: 500 mg via ORAL
  Filled 2015-07-10: qty 1

## 2015-07-10 MED ORDER — CIPROFLOXACIN HCL 500 MG PO TABS
500.0000 mg | ORAL_TABLET | Freq: Two times a day (BID) | ORAL | Status: DC
  Administered 2015-07-10: 500 mg via ORAL
  Filled 2015-07-10 (×3): qty 1

## 2015-07-10 NOTE — Discharge Summary (Signed)
Patient ID: [name] [name] MRN: [id number] DOB/AGE: Sep 08, 1955 60 y.o.  Admit date: 07/07/2015 Discharge date: 07/10/2015  Discharge Diagnoses:  Diverticular abscess  Procedures Performed: IR guided perc drainage  Discharged Condition: stable  Hospital Course: 60 yo female seen in the ER with abdominal pain found to have recurrent diverticular abscess. IR placed a drain and her symptoms significantly improved. She was placed on antibiotics and her WBC normalized. At the time of DC she was afebrile, VSS, her abdomen was soft, NT, no peritonitis. Drain was removed. She will complete A/B therapy as outpatient. She will need elective colectomy and colonoscopy.  Discharge Orders: Discharge Instructions    Call MD for:  difficulty breathing, headache or visual disturbances    Complete by:  As directed      Call MD for:  persistant nausea and vomiting    Complete by:  As directed      Call MD for:  severe uncontrolled pain    Complete by:  As directed      Call MD for:  temperature >100.4    Complete by:  As directed      Diet - low sodium heart healthy    Complete by:  As directed      Increase activity slowly    Complete by:  As directed            Disposition: 01-Home or Self Care  Discharge Medications:  Current facility-administered medications:  .  ciprofloxacin (CIPRO) tablet 500 mg, 500 mg, Oral, BID, Diego F Pabon, MD .  DULoxetine (CYMBALTA) DR capsule 60 mg, 60 mg, Oral, Daily, Clayburn Pert, MD, 60 mg at 07/10/15 0854 .  enoxaparin (LOVENOX) injection 40 mg, 40 mg, Subcutaneous, Q24H, Clayburn Pert, MD, 40 mg at 07/10/15 0853 .  hydrALAZINE (APRESOLINE) injection 10 mg, 10 mg, Intravenous, Q2H PRN, Clayburn Pert, MD .  HYDROcodone-acetaminophen (NORCO/VICODIN) 5-325 MG per tablet 1-2 tablet, 1-2 tablet, Oral, Q4H PRN, Hubbard Robinson, MD, 1 tablet at 07/10/15 0446 .  ketorolac (TORADOL) 30 MG/ML injection 30 mg, 30 mg, Intravenous, 4 times per day, Hubbard Robinson, MD, 30 mg at 07/10/15 1158 .  metroNIDAZOLE (FLAGYL) tablet 500 mg, 500 mg, Oral, 3 times per day, Diego F Pabon, MD .  morphine 4 MG/ML injection 4 mg, 4 mg, Intravenous, Q4H PRN, Clayburn Pert, MD, 4 mg at 07/09/15 1418 .  ondansetron (ZOFRAN-ODT) disintegrating tablet 4 mg, 4 mg, Oral, Q6H PRN **OR** ondansetron (ZOFRAN) injection 4 mg, 4 mg, Intravenous, Q6H PRN, Clayburn Pert, MD, 4 mg at 07/10/15 1302 .  pantoprazole (PROTONIX) EC tablet 40 mg, 40 mg, Oral, Daily, Diego F Pabon, MD, 40 mg at 07/10/15 1140 .  sodium chloride flush (NS) 0.9 % injection 5 mL, 5 mL, Intracatheter, Q8H, Jaime Wagner, DO, 5 mL at 07/10/15 0853 .  traZODone (DESYREL) tablet 100 mg, 100 mg, Oral, QHS PRN, Clayburn Pert, MD  Follwup: Follow-up Information    Follow up with [facility] In 2 weeks.   Contact information:   [place]      Signed: Jules Husbands 07/10/2015, 5:46 PM   Caroleen Hamman, MD FACS

## 2015-07-10 NOTE — Progress Notes (Signed)
MD ordered patient to be discharged home.  Discharge instructions were reviewed with the patient and she voiced understanding.  Prescriptions were given.  Patient instructed no making her own follow-up appointment.  JP drain was removed per MD order.  IV was removed with catheter intact.  All questions were answered.  Patient left via wheelchair, escorted by nursing.

## 2015-07-10 NOTE — Discharge Instructions (Signed)
Diverticulitis Diverticulitis is when small pockets that have formed in your colon (large intestine) become infected or swollen. HOME CARE  Follow your doctor's instructions.  Follow a special diet if told by your doctor.  When you feel better, your doctor may tell you to change your diet. You may be told to eat a lot of fiber. Fruits and vegetables are good sources of fiber. Fiber makes it easier to poop (have bowel movements).  Take supplements or probiotics as told by your doctor.  Only take medicines as told by your doctor.  Keep all follow-up visits with your doctor. GET HELP IF:  Your pain does not get better.  You have a hard time eating food.  You are not pooping like normal. GET HELP RIGHT AWAY IF:  Your pain gets worse.  Your problems do not get better.  Your problems suddenly get worse.  You have a fever.  You keep throwing up (vomiting).  You have bloody or black, tarry poop (stool). MAKE SURE YOU:   Understand these instructions.  Will watch your condition.  Will get help right away if you are not doing well or get worse.   This information is not intended to replace advice given to you by your health care provider. Make sure you discuss any questions you have with your health care provider.   Document Released: 10/15/2007 Document Revised: 05/03/2013 Document Reviewed: 03/23/2013 Elsevier Interactive Patient Education 2016 Elsevier Inc[employer]  Abscess An abscess (boil or furuncle) is an infected area on or under the skin. This area is filled with yellowish-white fluid (pus) and other material (debris). HOME CARE   Only take medicines as told by your doctor.  If you were given antibiotic medicine, take it as directed. Finish the medicine even if you start to feel better.  If gauze is used, follow your doctor's directions for changing the gauze.  To avoid spreading the infection:  Keep your abscess covered with a bandage.  Wash your hands  well.  Do not share personal care items, towels, or whirlpools with others.  Avoid skin contact with others.  Keep your skin and clothes clean around the abscess.  Keep all doctor visits as told. GET HELP RIGHT AWAY IF:   You have more pain, puffiness (swelling), or redness in the wound site.  You have more fluid or blood coming from the wound site.  You have muscle aches, chills, or you feel sick.  You have a fever. MAKE SURE YOU:   Understand these instructions.  Will watch your condition.  Will get help right away if you are not doing well or get worse.   This information is not intended to replace advice given to you by your health care provider. Make sure you discuss any questions you have with your health care provider.   Document Released: 10/15/2007 Document Revised: 10/28/2011 Document Reviewed: 07/12/2011 Elsevier Interactive Patient Education [employer].

## 2015-07-10 NOTE — Progress Notes (Signed)
CC: Diverticular abscess Subjective: Feeling much better, no pain minimal output from the drain. White count going down  Objective: Vital signs in last 24 hours: Temp:  [97.6 F (36.4 C)-98.1 F (36.7 C)] 97.8 F (36.6 C) (02/28 0457) Pulse Rate:  [61-75] 61 (02/28 0457) Resp:  [17-18] 17 (02/28 0457) BP: (85-105)/(49-63) 105/57 mmHg (02/28 0457) SpO2:  [94 %-97 %] 97 % (02/28 0457) Last BM Date: 07/07/15  Intake/Output from previous day: 02/27 0701 - 02/28 0700 In: [place] [P.O.:2280; I.V.:2095; IV Piggyback:176] Out: [id number] [Urine:3150; Drains:20] Intake/Output this shift: Total I/O In: 103 [I.V.:82; IV Piggyback:21] Out: 375 [Urine:350; Drains:25]  Physical exam: No acute distress, awake alert  Chest: Clear to auscultation, S1 and S2.  Abdomen: Soft, nontender nondistended drain in place with minimal output Extremities: Well perfused, no edema warm to touch   esults: CBC   Recent Labs  07/09/15 0411 07/10/15 0520  WBC 11.3* 8.4  HGB 10.3* 10.9*  HCT 31.2* 32.0*  PLT 386 435   BMET  Recent Labs  07/09/15 0411 07/10/15 0520  NA 137 141  K 4.3 4.4  CL 107 110  CO2 26 24  GLUCOSE 96 95  BUN 8 9  CREATININE 0.98 0.95  CALCIUM 8.1* 8.4*   PT/INR  Recent Labs  07/08/15 0519  LABPROT 16.1*  INR 1.28   ABG No results for input(s): PHART, HCO3 in the last 72 hours.  Invalid input(s): PCO2, PO2  Studies/Results: Ct Image Guided Drainage Percut Cath  Peritoneal Retroperit  07/08/2015  INDICATION: 60 year old female with a history of diverticular abscess. EXAM: CT GUIDED DRAINAGE OF LEFT ABDOMINAL DIVERTICULAR ABSCESS MEDICATIONS: The patient is currently admitted to the hospital and receiving intravenous antibiotics. The antibiotics were administered within an appropriate time frame prior to the initiation of the procedure. ANESTHESIA/SEDATION: 1.0 mg IV Versed 50 mcg IV Fentanyl Moderate Sedation Time:  20 The patient was continuously monitored during the  procedure by the interventional radiology nurse under my direct supervision. COMPLICATIONS: None TECHNIQUE: Informed written consent was obtained from the patient after a thorough discussion of the procedural risks, benefits and alternatives. All questions were addressed. Maximal Sterile Barrier Technique was utilized including caps, mask, sterile gowns, sterile gloves, sterile drape, hand hygiene and skin antiseptic. A timeout was performed prior to the initiation of the procedure. PROCEDURE: The left abdomen was prepped with chlorhexidine in a sterile fashion, and a sterile drape was applied covering the operative field. A sterile gown and sterile gloves were used for the procedure. Local anesthesia was provided with 1% Lidocaine. Once the patient is prepped and draped in the usual sterile fashion, the skin and subcutaneous tissues were generously infiltrated 1% lidocaine for local anesthesia. A small stab incision was made with 11 blade scalpel, and using CT guidance, an 18 gauge trocar needle was advanced into fluid and gas collection in the left pericolic gutter. Once we confirmed needle tip placement, modified Seldinger technique was used to place a 10 French pigtail catheter drain. Approximately 60 cc of frankly purulent material was aspirated for a sample. Catheter was sutured in position attached to gravity bag. Patient tolerated the procedure well and remained hemodynamically stable throughout. No complications were encountered and no significant blood loss was encountered. FINDINGS: CT demonstrates left pericolic gutter fluid and gas collection, adjacent to descending colon. Final image demonstrates safe placement of 10 French pigtail catheter into the collection. IMPRESSION: Status post CT-guided drainage of left pericolic abscess. Sample sent to the lab for analysis. Signed, York Cerise  Pasty Arch, DO [facility] Radiology Electronically Signed   By: Corrie Mckusick D.O.   On: 07/08/2015 19:48    Anti-infectives: Anti-infectives    Start     Dose/Rate Route Frequency Ordered Stop   07/07/15 1800  piperacillin-tazobactam (ZOSYN) IVPB 3.375 g     3.375 g 12.5 mL/hr over 240 Minutes Intravenous  Once 07/07/15 1753 07/08/15 0020   07/07/15 0400  piperacillin-tazobactam (ZOSYN) IVPB 3.375 g     3.375 g 12.5 mL/hr over 240 Minutes Intravenous 3 times per day 07/07/15 2052        Assessment/Plan:  Diverticulitis with abscess status post IR drainage. Patient continues to improve we will advance to low residue diet and hopefully discharge her in the morning with removal of the drain. No need for any immediate surgical intervention, she will benefit from outpatient colonoscopy and elective sigmoid colectomy  Caroleen Hamman, MD, FACS  07/10/2015

## 2015-07-11 LAB — CULTURE, ROUTINE-ABSCESS

## 2015-07-16 LAB — CULTURE, BLOOD (ROUTINE X 2)
Culture: NO GROWTH
Culture: NO GROWTH

## 2015-07-17 ENCOUNTER — Telehealth: Payer: Self-pay

## 2015-07-17 ENCOUNTER — Ambulatory Visit: Admission: RE | Admit: 2015-07-17 | Payer: 59 | Source: Ambulatory Visit | Admitting: Gastroenterology

## 2015-07-17 ENCOUNTER — Encounter: Admission: RE | Payer: Self-pay | Source: Ambulatory Visit

## 2015-07-17 SURGERY — COLONOSCOPY WITH PROPOFOL
Anesthesia: General

## 2015-07-17 NOTE — Telephone Encounter (Signed)
Patient called [place] numerous times in reference to her disability paperwork. She stated that she wanted to make sure that her paperwork stated that she was okay to return to work after she was seen by the surgeon on 07/27/2015. I told her that I could not put that on her disability paperwork since she received a disability letter from the surgeon when she was discharged on 07/10/2015 stating that she was to return to work on 07/17/2015. However, I asked patient how she was feeling since she was requesting an extension. She stated that she was still not felling well. She gets tired easily and continues to have abdominal pain. Therefore, I recommended for her to go to the emergency room since I did not have a surgeon in the office today to offer an extension to be out of work until 07/27/2015, but she could go to the emergency room since she is not doing well. I told her once she was seen by the emergency room that she could ask the doctor for an extension since she was still not feeling well to return to work. Patient stated that she would see if she could go to the emergency room. I told her to let me know if she did so I could update her disability paperwork. Patient understood.

## 2015-07-17 NOTE — Telephone Encounter (Signed)
Patient called again and stated that she will not go to the emergency room because it will cost her a lot of money but would like to be seen with a surgeon this week. I told her that we could definitely see her tomorrow with Dr. Burt Knack. However, I told patient that if she felt worse, to please go to the emergency room. Patient understood. We rescheduled her appointment from 07/27/2015 to 07/18/2015 at 9:15 AM with Dr. Burt Knack. Patient agreed in coming tomorrow to be seen.

## 2015-07-18 ENCOUNTER — Telehealth: Payer: Self-pay

## 2015-07-18 ENCOUNTER — Ambulatory Visit
Admission: RE | Admit: 2015-07-18 | Discharge: 2015-07-18 | Disposition: A | Discharge status: Home / Self Care | Payer: 59 | Source: Ambulatory Visit | Attending: Surgery | Admitting: Surgery

## 2015-07-18 ENCOUNTER — Ambulatory Visit (INDEPENDENT_AMBULATORY_CARE_PROVIDER_SITE_OTHER): Payer: 59 | Admitting: Surgery

## 2015-07-18 ENCOUNTER — Other Ambulatory Visit
Admission: RE | Admit: 2015-07-18 | Discharge: 2015-07-18 | Disposition: A | Discharge status: Home / Self Care | Payer: 59 | Source: Ambulatory Visit | Attending: Surgery | Admitting: Surgery

## 2015-07-18 ENCOUNTER — Encounter: Payer: Self-pay | Admitting: Surgery

## 2015-07-18 VITALS — BP 111/76 | HR 93 | Temp 98.2°F | Ht 65.0 in | Wt 198.2 lb

## 2015-07-18 DIAGNOSIS — L0291 Cutaneous abscess, unspecified: Secondary | ICD-10-CM | POA: Diagnosis not present

## 2015-07-18 DIAGNOSIS — R1084 Generalized abdominal pain: Secondary | ICD-10-CM | POA: Diagnosis not present

## 2015-07-18 DIAGNOSIS — K5792 Diverticulitis of intestine, part unspecified, without perforation or abscess without bleeding: Secondary | ICD-10-CM

## 2015-07-18 LAB — CBC WITH DIFFERENTIAL/PLATELET
BASOS PCT: 1 %
Basophils Absolute: 0.1 10*3/uL (ref 0–0.1)
EOS PCT: 2 %
Eosinophils Absolute: 0.3 10*3/uL (ref 0–0.7)
HEMATOCRIT: 39.6 % (ref 35.0–47.0)
Hemoglobin: 13.3 g/dL (ref 12.0–16.0)
Lymphocytes Relative: 27 %
Lymphs Abs: 3.2 10*3/uL (ref 1.0–3.6)
MCH: 28.6 pg (ref 26.0–34.0)
MCHC: 33.7 g/dL (ref 32.0–36.0)
MCV: 85 fL (ref 80.0–100.0)
MONO ABS: 0.9 10*3/uL (ref 0.2–0.9)
MONOS PCT: 8 %
NEUTROS ABS: 7.3 10*3/uL — AB (ref 1.4–6.5)
Neutrophils Relative %: 62 %
PLATELETS: 530 10*3/uL — AB (ref 150–440)
RBC: 4.65 MIL/uL (ref 3.80–5.20)
RDW: 14.1 % (ref 11.5–14.5)
WBC: 11.7 10*3/uL — ABNORMAL HIGH (ref 3.6–11.0)

## 2015-07-18 MED ORDER — CIPROFLOXACIN HCL 500 MG PO TABS: 500.0000 mg | ORAL_TABLET | Freq: Two times a day (BID) | ORAL | Status: DC

## 2015-07-18 MED ORDER — IOHEXOL 350 MG/ML SOLN
100.0000 mL | Freq: Once | INTRAVENOUS | Status: AC | PRN
  Administered 2015-07-18: 100 mL via INTRAVENOUS

## 2015-07-18 MED ORDER — METRONIDAZOLE 500 MG PO TABS: 500.0000 mg | ORAL_TABLET | Freq: Three times a day (TID) | ORAL | Status: DC

## 2015-07-18 NOTE — Telephone Encounter (Signed)
Patient brought new paperwork to be filled out. They are filled out and faxed.

## 2015-07-18 NOTE — Patient Instructions (Addendum)
We will send you for labs today.  We will order a CT Scan to be done in the next several days.  We are restarting your antibiotics. Please go to your pharmacy to pick these up and start today.  We will call you with the results of all testing.

## 2015-07-18 NOTE — Progress Notes (Signed)
Outpatient Surgical Follow Up  07/18/2015  [name] [name] is an 60 y.o. female.   CC:ac diverticulitis  HPI: Patient with recurrent acute diverticulitis who underwent CT-guided drainage for an abscess about 10 days ago she now presents having stopped her antibiotics yesterday with no fevers or chills and no abdominal pain but not feeling well stating that she is weak and has malaise.  Past Medical History  Diagnosis Date  . DDD (degenerative disc disease), lumbar   . Spine malformation     Kiari Malformation  . Thyroid disease   . GERD (gastroesophageal reflux disease)   . Irregular heart rate   . Hypothyroidism   . Depression   . Pneumonia     hx  . Sleep apnea     unable to wear mask-sleep study 3-4 yrs ago  . Diverticulitis     Past Surgical History  Procedure Laterality Date  . Tonsillectomy  as child  . Back surgery  1992    l4 and l5  . Craniectomy suboccipital w/ cervical laminectomy / chiari  08  . Carpal tunnel release Bilateral 2012  . Total hip arthroplasty Right 01/12/2013    Procedure: TOTAL HIP ARTHROPLASTY WITH AUTOGRAFT;  Surgeon: Ninetta Lights, MD;  Location: [facility];  Service: Orthopedics;  Laterality: Right;  . Appendectomy  60 years old  . Abdominal hysterectomy  61 years old    Family History  Problem Relation Age of Onset  . COPD Mother   . Stroke Mother   . Cancer Father     Social History:  reports that she has never smoked. She has never used smokeless tobacco. She reports that she does not drink alcohol or use illicit drugs.  Allergies:  Allergies  Allergen Reactions  . Morphine     REACTION: Bradycardia, Hypotension  . Diphenhydramine Hcl Palpitations    Medications reviewed.   Review of Systems:   Review of Systems  Constitutional: Positive for malaise/fatigue. Negative for fever and chills.  HENT: Negative.   Eyes: Negative.   Respiratory: Negative.   Cardiovascular: Negative.   Gastrointestinal: Positive for nausea and  diarrhea. Negative for heartburn, vomiting, abdominal pain, constipation, blood in stool and melena.  Genitourinary: Negative.   Musculoskeletal: Negative.   Skin: Negative.   Neurological: Positive for weakness.  Endo/Heme/Allergies: Negative.   Psychiatric/Behavioral: Negative.      Physical Exam:  BP 111/76 mmHg  Pulse 93  Temp(Src) 98.2 F (36.8 C) (Oral)  Ht 5\' 5"  (1.651 m)  Wt 198 lb 3.2 oz (89.903 kg)  BMI 32.98 kg/m2  Physical Exam  Constitutional: She is oriented to person, place, and time and well-developed, well-nourished, and in no distress. No distress.  HENT:  Head: Normocephalic and atraumatic.  Missing multiple teeth  Eyes: Pupils are equal, round, and reactive to light. Right eye exhibits no discharge. Left eye exhibits no discharge. No scleral icterus.  Neck: Normal range of motion.  Cardiovascular: Normal rate and normal heart sounds.   No murmur heard. Pulmonary/Chest: Effort normal and breath sounds normal. No respiratory distress. She has no wheezes. She has no rales.  Abdominal: Soft. She exhibits no distension. There is no tenderness. There is no rebound.  Musculoskeletal: Normal range of motion. She exhibits edema. She exhibits no tenderness.  Lymphadenopathy:    She has no cervical adenopathy.  Neurological: She is alert and oriented to person, place, and time.  Skin: Skin is warm and dry. She is not diaphoretic.  Psychiatric: Mood and affect normal.  Tearful frustrated crying      No results found for this or any previous visit (from the past 48 hour(s)). No results found.  Assessment/Plan:  Records are reviewed and with a patient who has recurrent diverticulitis and a recent CT-guided drainage, she warrants rechecking white blood cell count as well as a CT scan of the abdomen and pelvis. I will restart her Cipro Flagyl empirically and have her follow-up next week. Florene Glen, MD, FACS

## 2015-07-20 ENCOUNTER — Telehealth: Payer: Self-pay | Admitting: Surgery

## 2015-07-20 ENCOUNTER — Telehealth: Payer: Self-pay

## 2015-07-20 NOTE — Telephone Encounter (Signed)
I put FMLA paper work to the patients chart under media for you.

## 2015-07-20 NOTE — Telephone Encounter (Signed)
Patient called this morning asking if her disability information was already filled out and faxed. I told her that I did. However, patient gave me an additional fax number to fax [phone]). I told that I would do it right away. I received confirmation that it went through.

## 2015-07-23 ENCOUNTER — Telehealth: Payer: Self-pay | Admitting: Surgery

## 2015-07-23 NOTE — Telephone Encounter (Signed)
This form was filled out and faxed.

## 2015-07-23 NOTE — Telephone Encounter (Signed)
Patient has called and requesting results of labs from 07/18/15. Results are available at this time.

## 2015-07-23 NOTE — Telephone Encounter (Signed)
Called patient back with her lab results. Patient didn't have further questions.

## 2015-07-24 ENCOUNTER — Telehealth: Payer: Self-pay

## 2015-07-24 NOTE — Telephone Encounter (Signed)
FMLA Form for [name] [name] was filled out and faxed.

## 2015-07-26 ENCOUNTER — Ambulatory Visit: Payer: 59 | Admitting: Surgery

## 2015-07-27 ENCOUNTER — Ambulatory Visit: Payer: 59 | Admitting: Surgery

## 2015-08-01 ENCOUNTER — Encounter: Payer: Self-pay | Admitting: Surgery

## 2015-08-01 ENCOUNTER — Ambulatory Visit (INDEPENDENT_AMBULATORY_CARE_PROVIDER_SITE_OTHER): Payer: 59 | Admitting: Surgery

## 2015-08-01 VITALS — BP 145/91 | HR 84 | Temp 98.3°F | Ht 65.0 in | Wt 201.2 lb

## 2015-08-01 DIAGNOSIS — K5732 Diverticulitis of large intestine without perforation or abscess without bleeding: Secondary | ICD-10-CM | POA: Diagnosis not present

## 2015-08-01 NOTE — Progress Notes (Signed)
[name] [name] is a 59 year old female well known to last with a history of diverticulitis with abscess requiring hospitalization with placement of percutaneous drain. She has had recurrent and multiple episodes of diverticulitis. Last time she was seen by my partner Dr. Burt Knack couple weeks ago with some malaise, weakness and and a CT scan was performed. I have personally reviewed this there is just a trace of a fluid collection in the descending colon/sigmoid junction and that is significantly improved from the previous collection that she had. Today she comes in complaining of persistent weakness and malaise. She is able to take normal by mouth she actually has gained about 3 pounds, she is having normal bowel movements and she has occasional intermittent moderate abdominal pain on the left side. She describes his pain as a pressure-type. She denies any fevers or chills. She has completed her antibiotics already. ROS: Otherwise negative  PE: No acute distress awake alert Abdomen: Soft, nontender no peritonitis no masses Extremities: No edema, well-perfused  A/P: Chronic diverticulitis. At this point her symptoms have improved although not completely subsided. Discussed with her about the options and currently she is afebrile and doesn't have any tenderness and with objective evidence of improvement and by follow-up CT scan. I do not think that she needs additional antibiotics at this time. She will need a colonoscopy in a couple weeks and and after the results we will see her back for a formal discussion about an elective laparoscopic sigmoid colectomy. I discussed with her in detail about the operation today the length of stay, the risks benefits and possible complications. I spent around 25 minutes in this encounter with the majority of time I located to counseling.

## 2015-08-01 NOTE — Patient Instructions (Signed)
You will need a Colonoscopy in about 2 weeks with Dr. Allen Norris. We will call you after we set this up.  We will see you back in clinic in 3 weeks. Please see your appointment below. If you have any problems or concerns prior to your next appointment please let us know.  You will need to be out of for 3 more weeks. See your note given today. Give [place] 3 working days to get all of your disability updated and your notes from today's visit faxed over.

## 2015-08-06 ENCOUNTER — Telehealth: Payer: Self-pay

## 2015-08-06 ENCOUNTER — Other Ambulatory Visit: Payer: Self-pay

## 2015-08-06 MED ORDER — NA SULFATE-K SULFATE-MG SULF 17.5-3.13-1.6 GM/177ML PO SOLN: 1.0000 | ORAL | Status: DC

## 2015-08-06 NOTE — Telephone Encounter (Signed)
Adam Phenix (Nurse Case Manager) called stating that she was reviewing patient's disability forms and had some questions for the doctor. I told her that the doctor was not available at this time but I would ask via e-mail. Cecille Rubin stated that she would wait for my call.  Lori's questions were: Is there anything else that the doctor would like to add?                                     What is patient not able to do?                                     What are patient's job restrictions? Or accomodations?

## 2015-08-06 NOTE — Telephone Encounter (Signed)
Patient will have her colonoscopy scheduled for 08/16/2015 with Dr. Allen Norris. Patient was notified.

## 2015-08-06 NOTE — Telephone Encounter (Signed)
Patient's information was sent via mail today to her.  Suprep sent to pharmacy at this time.   Ginger was notified of date and will place orders.

## 2015-08-08 NOTE — Telephone Encounter (Signed)
Received another call from Cecille Rubin stating that she will have to close this case by end of week. She would like to know specifically what patient's functional impairment is, what surgeon's physical findings are, can patient return to work with work restrictions, and what is patient's estimated return to work date.  I informed her that an email was placed to Dr. Dahlia Byes on 3/27 and we have not received an answer as surgeon is not available at this time and his return work date is 08/13/15.   Lori's phone number is [phone] and she works for [employer] term disability company. Please reply to her when notification has been received from Dr. Dahlia Byes.

## 2015-08-08 NOTE — Telephone Encounter (Signed)
Called and spoke to Herricks to let her know that I have not received a response from Dr. Dahlia Byes. I also told her that he will be back until April 3rd and that I would ask him the questions that Cecille Rubin needs answered. Cecille Rubin understood and stated that she will leave the case opened until August 14, 2015.  I will call Cecille Rubin once I speak with Dr. Dahlia Byes.

## 2015-08-09 NOTE — Telephone Encounter (Signed)
I faxed [employer] patient's last office note from 08/01/2015 to [phone]. I told them that if they needed additional information or a form to be filled out, to please call or fax it to me since I have never received a form from them.

## 2015-08-09 NOTE — Telephone Encounter (Signed)
Patient has called and stated that we need to send her Short Term Disability form and clinic notes from 08/01/15 to fax # [phone] --- Attention: Anne Hahn Short Term Claim # [id number].

## 2015-08-14 NOTE — Discharge Instructions (Signed)

## 2015-08-15 NOTE — Telephone Encounter (Signed)
Patient has called to see if Dr Dahlia Byes has contacted anyone about her short term disability. Patient would like a call back.

## 2015-08-15 NOTE — Telephone Encounter (Signed)
Called patient back to let her know that Dr. Dahlia Byes did speak with Cecille Rubin and have not heard back from her. Therefore, I believe that Cecille Rubin is okay with the information that we have given her in reference to patient's disability. Patient understood and had no further questions.

## 2015-08-16 ENCOUNTER — Ambulatory Visit
Admission: RE | Admit: 2015-08-16 | Discharge: 2015-08-16 | Disposition: A | Discharge status: Home / Self Care | Payer: 59 | Source: Ambulatory Visit | Attending: Gastroenterology | Admitting: Gastroenterology

## 2015-08-16 ENCOUNTER — Encounter
Admission: RE | Disposition: A | Discharge status: Home / Self Care | Payer: Self-pay | Source: Ambulatory Visit | Attending: Gastroenterology

## 2015-08-16 ENCOUNTER — Ambulatory Visit: Payer: 59 | Admitting: Anesthesiology

## 2015-08-16 DIAGNOSIS — K219 Gastro-esophageal reflux disease without esophagitis: Secondary | ICD-10-CM | POA: Insufficient documentation

## 2015-08-16 DIAGNOSIS — E039 Hypothyroidism, unspecified: Secondary | ICD-10-CM | POA: Diagnosis not present

## 2015-08-16 DIAGNOSIS — Z96641 Presence of right artificial hip joint: Secondary | ICD-10-CM | POA: Insufficient documentation

## 2015-08-16 DIAGNOSIS — Z79899 Other long term (current) drug therapy: Secondary | ICD-10-CM | POA: Insufficient documentation

## 2015-08-16 DIAGNOSIS — Z825 Family history of asthma and other chronic lower respiratory diseases: Secondary | ICD-10-CM | POA: Insufficient documentation

## 2015-08-16 DIAGNOSIS — Z823 Family history of stroke: Secondary | ICD-10-CM | POA: Diagnosis not present

## 2015-08-16 DIAGNOSIS — M5136 Other intervertebral disc degeneration, lumbar region: Secondary | ICD-10-CM | POA: Diagnosis not present

## 2015-08-16 DIAGNOSIS — G4730 Sleep apnea, unspecified: Secondary | ICD-10-CM | POA: Diagnosis not present

## 2015-08-16 DIAGNOSIS — Z888 Allergy status to other drugs, medicaments and biological substances status: Secondary | ICD-10-CM | POA: Diagnosis not present

## 2015-08-16 DIAGNOSIS — F329 Major depressive disorder, single episode, unspecified: Secondary | ICD-10-CM | POA: Diagnosis not present

## 2015-08-16 DIAGNOSIS — K5732 Diverticulitis of large intestine without perforation or abscess without bleeding: Secondary | ICD-10-CM | POA: Diagnosis not present

## 2015-08-16 DIAGNOSIS — Z7982 Long term (current) use of aspirin: Secondary | ICD-10-CM | POA: Diagnosis not present

## 2015-08-16 DIAGNOSIS — K5730 Diverticulosis of large intestine without perforation or abscess without bleeding: Secondary | ICD-10-CM | POA: Diagnosis not present

## 2015-08-16 DIAGNOSIS — Z885 Allergy status to narcotic agent status: Secondary | ICD-10-CM | POA: Diagnosis not present

## 2015-08-16 DIAGNOSIS — K640 First degree hemorrhoids: Secondary | ICD-10-CM | POA: Insufficient documentation

## 2015-08-16 DIAGNOSIS — K5792 Diverticulitis of intestine, part unspecified, without perforation or abscess without bleeding: Secondary | ICD-10-CM | POA: Diagnosis present

## 2015-08-16 DIAGNOSIS — D124 Benign neoplasm of descending colon: Secondary | ICD-10-CM | POA: Insufficient documentation

## 2015-08-16 HISTORY — DX: Presence of dental prosthetic device (complete) (partial): Z97.2

## 2015-08-16 HISTORY — PX: COLONOSCOPY WITH PROPOFOL: SHX5780

## 2015-08-16 HISTORY — PX: POLYPECTOMY: SHX5525

## 2015-08-16 SURGERY — COLONOSCOPY WITH PROPOFOL
Anesthesia: Monitor Anesthesia Care | Wound class: Contaminated

## 2015-08-16 MED ORDER — LIDOCAINE HCL (CARDIAC) 20 MG/ML IV SOLN
INTRAVENOUS | Status: DC | PRN
  Administered 2015-08-16: 20 mg via INTRAVENOUS

## 2015-08-16 MED ORDER — SIMETHICONE 40 MG/0.6ML PO SUSP
ORAL | Status: DC | PRN
  Administered 2015-08-16: 10:00:00

## 2015-08-16 MED ORDER — LACTATED RINGERS IV SOLN
INTRAVENOUS | Status: DC
  Administered 2015-08-16: 10:00:00 via INTRAVENOUS

## 2015-08-16 MED ORDER — SODIUM CHLORIDE 0.9 % IV SOLN: INTRAVENOUS | Status: DC

## 2015-08-16 MED ORDER — PROPOFOL 10 MG/ML IV BOLUS
INTRAVENOUS | Status: DC | PRN
  Administered 2015-08-16 (×4): 20 mg via INTRAVENOUS
  Administered 2015-08-16: 100 mg via INTRAVENOUS
  Administered 2015-08-16 (×2): 20 mg via INTRAVENOUS

## 2015-08-16 SURGICAL SUPPLY — 21 items
CANISTER SUCT 1200ML W/VALVE (MISCELLANEOUS) ×3 IMPLANT
CLIP HMST 235XBRD CATH ROT (MISCELLANEOUS) IMPLANT
CLIP RESOLUTION 360 11X235 (MISCELLANEOUS)
FCP ESCP3.2XJMB 240X2.8X (MISCELLANEOUS)
FORCEPS BIOP RAD 4 LRG CAP 4 (CUTTING FORCEPS) IMPLANT
FORCEPS BIOP RJ4 240 W/NDL (MISCELLANEOUS)
FORCEPS ESCP3.2XJMB 240X2.8X (MISCELLANEOUS) IMPLANT
GOWN CVR UNV OPN BCK APRN NK (MISCELLANEOUS) ×4 IMPLANT
GOWN ISOL THUMB LOOP REG UNIV (MISCELLANEOUS) ×6
INJECTOR VARIJECT VIN23 (MISCELLANEOUS) IMPLANT
KIT DEFENDO VALVE AND CONN (KITS) IMPLANT
KIT ENDO PROCEDURE OLY (KITS) ×3 IMPLANT
MARKER SPOT ENDO TATTOO 5ML (MISCELLANEOUS) IMPLANT
PAD GROUND ADULT SPLIT (MISCELLANEOUS) IMPLANT
PROBE APC STR FIRE (PROBE) ×3 IMPLANT
SNARE SHORT THROW 13M SML OVAL (MISCELLANEOUS) IMPLANT
SNARE SHORT THROW 30M LRG OVAL (MISCELLANEOUS) IMPLANT
SPOT EX ENDOSCOPIC TATTOO (MISCELLANEOUS)
VARIJECT INJECTOR VIN23 (MISCELLANEOUS)
WATER STERILE IRR 250ML POUR (IV SOLUTION) ×3 IMPLANT
WIDE-EYE POLYPTRAP (MISCELLANEOUS) IMPLANT

## 2015-08-16 NOTE — Op Note (Signed)
[facility] Patient Name: [name] [name] Procedure Date: 08/16/2015 10:16 AM MRN: [id number] Account #: [id number] Date of Birth: 03/13/1956 Admit Type: Outpatient Age: 60 Room: [facility] OR ROOM 01 Gender: Female Note Status: Finalized Procedure:            Colonoscopy Indications:          Follow-up of diverticulitis Providers:            Lucilla Lame, MD Referring MD:         Judithann Sauger, MD (Referring MD) Medicines:            Propofol per Anesthesia Complications:        No immediate complications. Procedure:            Pre-Anesthesia Assessment:                       - Prior to the procedure, a History and Physical was                        performed, and patient medications and allergies were                        reviewed. The patient's tolerance of previous                        anesthesia was also reviewed. The risks and benefits of                        the procedure and the sedation options and risks were                        discussed with the patient. All questions were                        answered, and informed consent was obtained. Prior                        Anticoagulants: The patient has taken no previous                        anticoagulant or antiplatelet agents. ASA Grade                        Assessment: II - A patient with mild systemic disease.                        After reviewing the risks and benefits, the patient was                        deemed in satisfactory condition to undergo the                        procedure.                       After obtaining informed consent, the colonoscope was                        passed under direct vision. Throughout the procedure,  the patient's blood pressure, pulse, and oxygen                        saturations were monitored continuously. The was                        introduced through the anus and advanced to the the   cecum, identified by appendiceal orifice and ileocecal                        valve. The colonoscopy was performed without                        difficulty. The patient tolerated the procedure well.                        The quality of the bowel preparation was excellent. Findings:      The perianal and digital rectal examinations were normal.      Three sessile polyps were found in the descending colon. The polyps were       1 to 4 mm in size. These polyps were removed with a cold biopsy forceps.       Resection and retrieval were complete.      Multiple small-mouthed diverticula were found in the sigmoid colon and       descending colon.      Non-bleeding internal hemorrhoids were found during retroflexion. The       hemorrhoids were Grade I (internal hemorrhoids that do not prolapse). Impression:           - Three 1 to 4 mm polyps in the descending colon,                        removed with a cold biopsy forceps. Resected and                        retrieved.                       - Diverticulosis in the sigmoid colon and in the                        descending colon.                       - Non-bleeding internal hemorrhoids. Recommendation:       - Await pathology results.                       - Repeat colonoscopy in 5 years if polyp adenoma and 10                        years if hyperplastic Procedure Code(s):    --- Professional ---                       [phone], Colonoscopy, flexible; with biopsy, single or                        multiple Diagnosis Code(s):    --- Professional ---  D12.4, Benign neoplasm of descending colon                       K57.32, Diverticulitis of large intestine without                        perforation or abscess without bleeding                       K57.30, Diverticulosis of large intestine without                        perforation or abscess without bleeding CPT copyright 2016 American Medical Association. All rights  reserved. The codes documented in this report are preliminary and upon coder review may  be revised to meet current compliance requirements. Lucilla Lame, MD 08/16/2015 10:39:52 AM This report has been signed electronically. Number of Addenda: 0 Note Initiated On: 08/16/2015 10:16 AM Scope Withdrawal Time: 0 hours 6 minutes 14 seconds  Total Procedure Duration: 0 hours 10 minutes 31 seconds       [facility]

## 2015-08-16 NOTE — Anesthesia Procedure Notes (Signed)
Procedure Name: MAC Performed by: Eddie Koc Pre-anesthesia Checklist: Patient identified, Emergency Drugs available, Suction available, Patient being monitored and Timeout performed Patient Re-evaluated:Patient Re-evaluated prior to inductionOxygen Delivery Method: Nasal cannula       

## 2015-08-16 NOTE — H&P (Signed)
[facility]  [address] [place], [place] [address] Phone: [phone] Fax : [phone]  Primary Care Physician:  Reginia Naas, MD Primary Gastroenterologist:  Dr. Allen Norris  Pre-Procedure History & Physical: HPI:  [name] [name] is a 60 y.o. female is here for an colonoscopy.   Past Medical History  Diagnosis Date  . Spine malformation     Kiari Malformation  . Thyroid disease   . GERD (gastroesophageal reflux disease)   . Irregular heart rate   . Hypothyroidism   . Depression   . Pneumonia     hx  . Sleep apnea     unable to wear mask-sleep study 3-4 yrs ago  . Diverticulitis   . Shortness of breath dyspnea     WITH EXERTION  . DDD (degenerative disc disease), lumbar     BACK, FINGER, WRIST  . DDD (degenerative disc disease), lumbar   . Wears dentures     UPPER    Past Surgical History  Procedure Laterality Date  . Tonsillectomy  as child  . Back surgery  1992    l4 and l5  . Craniectomy suboccipital w/ cervical laminectomy / chiari  08  . Carpal tunnel release Bilateral 2012  . Total hip arthroplasty Right 01/12/2013    Procedure: TOTAL HIP ARTHROPLASTY WITH AUTOGRAFT;  Surgeon: Ninetta Lights, MD;  Location: [facility];  Service: Orthopedics;  Laterality: Right;  . Appendectomy  60 years old  . Abdominal hysterectomy  60 years old    Prior to Admission medications   Medication Sig Start Date End Date Taking? Authorizing Provider  aspirin EC 81 MG tablet Take 81 mg by mouth daily. AM   Yes Historical Provider, MD  DULoxetine (CYMBALTA) 60 MG capsule Take 1 capsule by mouth daily. AM 05/15/15  Yes Historical Provider, MD  fenofibrate 160 MG tablet Take 160 mg by mouth daily. AM   Yes Historical Provider, MD  HYDROcodone-acetaminophen (NORCO/VICODIN) 5-325 MG tablet Take 1-2 tablets by mouth every 4 (four) hours as needed for moderate pain. 07/10/15  Yes Diego F Pabon, MD  levothyroxine (SYNTHROID, LEVOTHROID) 125 MCG tablet Take 125 mcg by  mouth daily before breakfast.   Yes Historical Provider, MD  Multiple Vitamin (MULTIVITAMIN WITH MINERALS) TABS tablet Take 1 tablet by mouth daily. AM   Yes Historical Provider, MD  Na Sulfate-K Sulfate-Mg Sulf (SUPREP BOWEL PREP) SOLN Take 1 kit by mouth as directed. Take first bottle at 5:00 PM the day before procedure and the second bottle four hours prior to your procedure. 08/06/15  Yes Lucilla Lame, MD  omeprazole (PRILOSEC) 20 MG capsule Take 20 mg by mouth daily. AM   Yes Historical Provider, MD  traZODone (DESYREL) 100 MG tablet Take 100 mg by mouth at bedtime as needed for sleep.    Yes Historical Provider, MD  ondansetron (ZOFRAN ODT) 4 MG disintegrating tablet Take 1 tablet (4 mg total) by mouth every 8 (eight) hours as needed for nausea or vomiting. 07/01/15   Carrie Mew, MD    Allergies as of 08/06/2015 - Review Complete 08/01/2015  Allergen Reaction Noted  . Morphine  11/07/2009  . Diphenhydramine hcl Palpitations 04/27/2011    Family History  Problem Relation Age of Onset  . COPD Mother   . Stroke Mother   . Cancer Father     Social History   Social History  . Marital Status: Single    Spouse Name: N/A  . Number of Children: N/A  . Years  of Education: N/A   Occupational History  . Not on file.   Social History Main Topics  . Smoking status: Never Smoker   . Smokeless tobacco: Never Used     Comment: 2004 last crack   occ alcohol  . Alcohol Use: No     Comment: 2 beers a year  . Drug Use: No     Comment: quit crack 12 years ago.  Marland Kitchen Sexual Activity: No   Other Topics Concern  . Not on file   Social History Narrative    Review of Systems: See HPI, otherwise negative ROS  Physical Exam: BP 97/68 mmHg  Pulse 81  Temp(Src) 97.7 F (36.5 C)  Resp 16  Ht _0  (1.651 m)  Wt 196 lb (88.905 kg)  BMI 32.62 kg/m2  SpO2 96% General:   Alert,  pleasant and cooperative in NAD Head:  Normocephalic and atraumatic. Neck:  Supple; no masses or  thyromegaly. Lungs:  Clear throughout to auscultation.    Heart:  Regular rate and rhythm. Abdomen:  Soft, nontender and nondistended. Normal bowel sounds, without guarding, and without rebound.   Neurologic:  Alert and  oriented x4;  grossly normal neurologically.  Impression/Plan: [name] [name] is here for an colonoscopy to be performed for diverticulitis  Risks, benefits, limitations, and alternatives regarding  colonoscopy have been reviewed with the patient.  Questions have been answered.  All parties agreeable.   Ollen Bowl, MD  08/16/2015, 9:45 AM

## 2015-08-16 NOTE — Transfer of Care (Signed)
Immediate Anesthesia Transfer of Care Note  Patient: [name] [name]  Procedure(s) Performed: Procedure(s): COLONOSCOPY WITH PROPOFOL (N/A) POLYPECTOMY  Patient Location: PACU  Anesthesia Type: MAC  Level of Consciousness: awake, alert  and patient cooperative  Airway and Oxygen Therapy: Patient Spontanous Breathing and Patient connected to supplemental oxygen  Post-op Assessment: Post-op Vital signs reviewed, Patient's Cardiovascular Status Stable, Respiratory Function Stable, Patent Airway and No signs of Nausea or vomiting  Post-op Vital Signs: Reviewed and stable  Complications: No apparent anesthesia complications

## 2015-08-16 NOTE — Anesthesia Postprocedure Evaluation (Signed)
Anesthesia Post Note  Patient: [name] [name]  Procedure(s) Performed: Procedure(s) (LRB): COLONOSCOPY WITH PROPOFOL (N/A) POLYPECTOMY  Patient location during evaluation: PACU Anesthesia Type: MAC Level of consciousness: awake and alert Pain management: pain level controlled Vital Signs Assessment: post-procedure vital signs reviewed and stable Respiratory status: spontaneous breathing, nonlabored ventilation, respiratory function stable and patient connected to nasal cannula oxygen Cardiovascular status: blood pressure returned to baseline and stable Postop Assessment: no signs of nausea or vomiting Anesthetic complications: no    Alisa Graff

## 2015-08-16 NOTE — Anesthesia Preprocedure Evaluation (Signed)
Anesthesia Evaluation  Patient identified by MRN, date of birth, ID band Patient awake    Reviewed: Allergy & Precautions, H&P , NPO status , Patient's Chart, lab work & pertinent test results, reviewed documented beta blocker date and time   Airway Mallampati: II  TM Distance: >3 FB Neck ROM: full    Dental  (+) Upper Dentures   Pulmonary neg pulmonary ROS, sleep apnea (does not tolerate CPAP) ,    Pulmonary exam normal breath sounds clear to auscultation       Cardiovascular Exercise Tolerance: Good negative cardio ROS   Rhythm:regular Rate:Normal     Neuro/Psych negative neurological ROS  negative psych ROS   GI/Hepatic Neg liver ROS, GERD  ,  Endo/Other  negative endocrine ROS  Renal/GU negative Renal ROS  negative genitourinary   Musculoskeletal   Abdominal   Peds  Hematology negative hematology ROS (+)   Anesthesia Other Findings   Reproductive/Obstetrics negative OB ROS                             Anesthesia Physical Anesthesia Plan  ASA: II  Anesthesia Plan: MAC   Post-op Pain Management:    Induction:   Airway Management Planned:   Additional Equipment:   Intra-op Plan:   Post-operative Plan:   Informed Consent: I have reviewed the patients History and Physical, chart, labs and discussed the procedure including the risks, benefits and alternatives for the proposed anesthesia with the patient or authorized representative who has indicated his/her understanding and acceptance.   Dental Advisory Given  Plan Discussed with: CRNA  Anesthesia Plan Comments:         Anesthesia Quick Evaluation

## 2015-08-17 ENCOUNTER — Encounter: Payer: Self-pay | Admitting: Gastroenterology

## 2015-08-20 ENCOUNTER — Telehealth: Payer: Self-pay | Admitting: Surgery

## 2015-08-20 NOTE — Telephone Encounter (Signed)
Called patient back and left a voicemail to return my call

## 2015-08-20 NOTE — Telephone Encounter (Signed)
Patient has called and would like to know if you have spoken with anyone about her disability FMLA from her previous call on 08/16/15. Please call patient back.

## 2015-08-20 NOTE — Telephone Encounter (Signed)
[name] [name], Patient is returning your call.

## 2015-08-20 NOTE — Telephone Encounter (Signed)
Called patient back. Please see other note from today.

## 2015-08-20 NOTE — Telephone Encounter (Signed)
Called patient back. Patient wanted to know if I had spoken with the claim manager. I told her that I had left him two voicemail and he has not returned my calls. Patient understood. I told her that if he returns my calls that I would let her know.

## 2015-08-21 ENCOUNTER — Encounter: Payer: Self-pay | Admitting: Gastroenterology

## 2015-08-23 ENCOUNTER — Ambulatory Visit (INDEPENDENT_AMBULATORY_CARE_PROVIDER_SITE_OTHER): Payer: 59 | Admitting: Surgery

## 2015-08-23 ENCOUNTER — Encounter: Payer: Self-pay | Admitting: Surgery

## 2015-08-23 VITALS — BP 132/83 | HR 85 | Temp 97.4°F | Ht 65.0 in | Wt 201.4 lb

## 2015-08-23 DIAGNOSIS — K5732 Diverticulitis of large intestine without perforation or abscess without bleeding: Secondary | ICD-10-CM | POA: Diagnosis not present

## 2015-08-23 MED ORDER — ERYTHROMYCIN BASE 500 MG PO TABS: 1000.0000 mg | ORAL_TABLET | Freq: Three times a day (TID) | ORAL | Status: DC

## 2015-08-23 MED ORDER — NEOMYCIN SULFATE 500 MG PO TABS: 1000.0000 mg | ORAL_TABLET | Freq: Three times a day (TID) | ORAL | Status: DC

## 2015-08-23 MED ORDER — BISACODYL 5 MG PO TBEC: 20.0000 mg | DELAYED_RELEASE_TABLET | Freq: Once | ORAL | Status: DC

## 2015-08-23 MED ORDER — POLYETHYLENE GLYCOL 3350 17 GM/SCOOP PO POWD
1.0000 | Freq: Once | ORAL | Status: DC
Start: 2015-08-23 — End: 2015-09-11

## 2015-08-23 NOTE — Patient Instructions (Signed)
We have seen you today to speak about removing a portion of your damaged Colon. We will arrange for this surgery to be done on 09/06/15 at [facility] by Dr. Dahlia Byes.  You will need to complete a bowel prep prior to your surgery, please see the information sheet provided today for your directions.  Also, there will be 2 different antibiotics that you will need to take the day of your bowel prep: Neomycin and Erythromycin. You will take 2 tablets of each medication 3 times on the day of your bowel prep- 8am, 2pm, and 8pm.  Please see the (blue) Pre-care sheet for the details about your scheduled surgery.  You will need to stop your Aspirin for 5 days prior to the surgery. The last day you will take this is on 08/31/15. You will hold this medication from 09/01/15 to 09/06/15. We will restart this in the hospital once it is safe to do so.

## 2015-08-23 NOTE — Progress Notes (Signed)
Patient ID: [name] [name], female   DOB: 05/05/1956, 59 y.o.   MRN: [id number]  History of Present Illness [name] [name] is a 59 y.o. female with complicated and recurrent diverticulitis with abscess. She now is symptom free, she is tolerating regular diet and having bowel movements. She recently had a colonoscopy by Dr. wall with tiny polyps removed. No evidence of malignancy. Diverticulosis noted She has good cardiovascular performance and denies any angina or dyspnea on exertion I have personally reviewed her CT scan and she did have a Doppler point a small abscess that significant improved   Past Medical History Past Medical History  Diagnosis Date  . Spine malformation     Kiari Malformation  . Thyroid disease   . GERD (gastroesophageal reflux disease)   . Irregular heart rate   . Hypothyroidism   . Depression   . Pneumonia     hx  . Sleep apnea     unable to wear mask-sleep study 3-4 yrs ago  . Diverticulitis   . Shortness of breath dyspnea     WITH EXERTION  . DDD (degenerative disc disease), lumbar     BACK, FINGER, WRIST  . DDD (degenerative disc disease), lumbar   . Wears dentures     UPPER     Past Surgical History  Procedure Laterality Date  . Tonsillectomy  as child  . Back surgery  1992    l4 and l5  . Craniectomy suboccipital w/ cervical laminectomy / chiari  08  . Carpal tunnel release Bilateral 2012  . Total hip arthroplasty Right 01/12/2013    Procedure: TOTAL HIP ARTHROPLASTY WITH AUTOGRAFT;  Surgeon: Daniel F Murphy, MD;  Location: [facility];  Service: Orthopedics;  Laterality: Right;  . Appendectomy  60 years old  . Abdominal hysterectomy  60 years old  . Colonoscopy with propofol N/A 08/16/2015    Procedure: COLONOSCOPY WITH PROPOFOL;  Surgeon: Darren Wohl, MD;  Location: [facility];  Service: Endoscopy;  Laterality: N/A;  . Polypectomy  08/16/2015    Procedure: POLYPECTOMY;  Surgeon: Darren Wohl, MD;  Location: [facility];  Service:  Endoscopy;;    Allergies  Allergen Reactions  . Morphine     REACTION: Bradycardia, Hypotension  . Diphenhydramine Hcl Palpitations    Current Outpatient Prescriptions  Medication Sig Dispense Refill  . aspirin EC 81 MG tablet Take 81 mg by mouth daily. AM    . bisacodyl (DULCOLAX) 5 MG EC tablet Take 4 tablets (20 mg total) by mouth once. 4 tablet 0  . DULoxetine (CYMBALTA) 60 MG capsule Take 1 capsule by mouth daily. AM    . erythromycin base (E-MYCIN) 500 MG tablet Take 2 tablets (1,000 mg total) by mouth 3 (three) times daily. 6 tablet 0  . fenofibrate 160 MG tablet Take 160 mg by mouth daily. AM    . HYDROcodone-acetaminophen (NORCO/VICODIN) 5-325 MG tablet Take 1-2 tablets by mouth every 4 (four) hours as needed for moderate pain. 30 tablet 0  . levothyroxine (SYNTHROID, LEVOTHROID) 125 MCG tablet Take 125 mcg by mouth daily before breakfast.    . Multiple Vitamin (MULTIVITAMIN WITH MINERALS) TABS tablet Take 1 tablet by mouth daily. AM    . Na Sulfate-K Sulfate-Mg Sulf (SUPREP BOWEL PREP) SOLN Take 1 kit by mouth as directed. Take first bottle at 5:00 PM the day before procedure and the second bottle four hours prior to your procedure. 1 Bottle 0  . neomycin (MYCIFRADIN) 500 MG tablet Take 2 tablets (  1,000 mg total) by mouth 3 (three) times daily. 6 tablet 0  . omeprazole (PRILOSEC) 20 MG capsule Take 20 mg by mouth daily. AM    . ondansetron (ZOFRAN ODT) 4 MG disintegrating tablet Take 1 tablet (4 mg total) by mouth every 8 (eight) hours as needed for nausea or vomiting. 20 tablet 0  . polyethylene glycol powder (GLYCOLAX/MIRALAX) powder Take 255 g by mouth once. See Bowel Prep sheet provided 255 g 0  . traZODone (DESYREL) 100 MG tablet Take 100 mg by mouth at bedtime as needed for sleep.      No current facility-administered medications for this visit.    Family History Family History  Problem Relation Age of Onset  . COPD Mother   . Stroke Mother   . Cancer Father       Social History Social History  Substance Use Topics  . Smoking status: Never Smoker   . Smokeless tobacco: Never Used     Comment: 2004 last crack   occ alcohol  . Alcohol Use: No     Comment: 2 beers a year     ROS 10 pts review of system was otherwise negative   Physical Exam Blood pressure 132/83, pulse 85, temperature 97.4 F (36.3 C), temperature source Oral, height _0  (1.651 m), weight 91.354 kg (201 lb 6.4 oz).  CONSTITUTIONAL: NAD RESPIRATORY:  Lungs are clear, and breath sounds are equal bilaterally. Normal respiratory effort without pathologic use of accessory muscles. CARDIOVASCULAR: Heart is regular without murmurs, gallops, or rubs. GI: The abdomen is soft, nontender, and nondistended. There were no palpable masses. There was no hepatosplenomegaly. There were normal bowel sounds. MUSCULOSKELETAL:  Normal muscle strength and tone in all four extremities.    SKIN: Skin turgor is normal. There are no pathologic skin lesions.  NEUROLOGIC:  Motor and sensation is grossly normal.  Cranial nerves are grossly intact. PSYCH:  Alert and oriented to person, place and time. Affect is normal.  Data Reviewed  I have personally reviewed the patient's imaging and medical records.    Assessment/Plan .   Current and complicated diverticulitis with abscess now with significant improvement of symptoms. Comes for discussion of elective sigmoid colectomy. I have explained to the patient in detail about the proposed surgery which is a sigmoid colectomy laparoscopically possible open. Risk, benefits and possible complications including but not limited to: Bleeding, infection, anastomotic leak, need for colostomy, need for re-interventions. She understands and she wishes to proceed. I have also discussed with her the expected postoperative course and outcomes. Extensive counseling provided. Plan for elective lap sigmoid colectomy hand-assisted.  [place], MD [place] 08/23/2015, 10:33 AM

## 2015-08-27 ENCOUNTER — Telehealth: Payer: Self-pay | Admitting: Surgery

## 2015-08-27 ENCOUNTER — Telehealth: Payer: Self-pay

## 2015-08-27 NOTE — Telephone Encounter (Signed)
Pt advised of pre op date/time and sx date. Sx: 09/06/15 with Dr Dahlia Byes with Dr Burt Knack assisting. Pre op: 08/30/15 @ 1:00pm--Office.   Patient made aware to call [phone], between 1-3:00pm the day before surgery, to find out what time to arrive.

## 2015-08-27 NOTE — Telephone Encounter (Signed)
Disability paperwork was filled out and faxed to [facility].

## 2015-08-29 ENCOUNTER — Telehealth: Payer: Self-pay

## 2015-08-29 NOTE — Telephone Encounter (Signed)
Patient's [employer] Short Term Disability requested last office visit from 08/23/2015 to be faxed to them. This have been faxed.

## 2015-08-30 ENCOUNTER — Encounter
Admission: RE | Admit: 2015-08-30 | Discharge: 2015-08-30 | Disposition: A | Discharge status: Home / Self Care | Payer: 59 | Source: Ambulatory Visit | Attending: Surgery | Admitting: Surgery

## 2015-08-30 ENCOUNTER — Other Ambulatory Visit: Payer: Self-pay

## 2015-08-30 DIAGNOSIS — K5732 Diverticulitis of large intestine without perforation or abscess without bleeding: Secondary | ICD-10-CM

## 2015-08-30 DIAGNOSIS — Z01812 Encounter for preprocedural laboratory examination: Secondary | ICD-10-CM | POA: Insufficient documentation

## 2015-08-30 HISTORY — DX: Headache, unspecified: R51.9

## 2015-08-30 HISTORY — DX: Headache: R51

## 2015-08-30 HISTORY — DX: Cardiac arrhythmia, unspecified: I49.9

## 2015-08-30 HISTORY — DX: Anxiety disorder, unspecified: F41.9

## 2015-08-30 LAB — COMPREHENSIVE METABOLIC PANEL
ALBUMIN: 4.8 g/dL (ref 3.5–5.0)
ALK PHOS: 59 U/L (ref 38–126)
ALT: 29 U/L (ref 14–54)
ANION GAP: 9 (ref 5–15)
AST: 33 U/L (ref 15–41)
BUN: 19 mg/dL (ref 6–20)
CALCIUM: 10 mg/dL (ref 8.9–10.3)
CHLORIDE: 105 mmol/L (ref 101–111)
CO2: 25 mmol/L (ref 22–32)
Creatinine, Ser: 1.05 mg/dL — ABNORMAL HIGH (ref 0.44–1.00)
GFR calc non Af Amer: 57 mL/min — ABNORMAL LOW (ref >60)
GLUCOSE: 88 mg/dL (ref 65–99)
Potassium: 4.1 mmol/L (ref 3.5–5.1)
SODIUM: 139 mmol/L (ref 135–145)
Total Bilirubin: 0.5 mg/dL (ref 0.3–1.2)
Total Protein: 7.6 g/dL (ref 6.5–8.1)

## 2015-08-30 LAB — CBC
HCT: 39.6 % (ref 35.0–47.0)
HEMOGLOBIN: 13.3 g/dL (ref 12.0–16.0)
MCH: 29.1 pg (ref 26.0–34.0)
MCHC: 33.5 g/dL (ref 32.0–36.0)
MCV: 86.8 fL (ref 80.0–100.0)
Platelets: 304 10*3/uL (ref 150–440)
RBC: 4.56 MIL/uL (ref 3.80–5.20)
RDW: 13.8 % (ref 11.5–14.5)
WBC: 7.5 10*3/uL (ref 3.6–11.0)

## 2015-08-30 LAB — PROTIME-INR
INR: 1.02
PROTHROMBIN TIME: 13.6 s (ref 11.4–15.0)

## 2015-08-30 LAB — SURGICAL PCR SCREEN
MRSA, PCR: NEGATIVE
STAPHYLOCOCCUS AUREUS: NEGATIVE

## 2015-08-30 LAB — APTT: APTT: 31 s (ref 24–36)

## 2015-08-30 NOTE — Pre-Procedure Instructions (Signed)
EKG FROM 07/01/15 NOTED AS NO CHANGE FROM 1O/21/16

## 2015-08-30 NOTE — Patient Instructions (Addendum)
       Your procedure is scheduled [id number] 27, 2017 (Thursday) Report to Day Surgery.([facility]) SECOND FLOOR To find out your arrival time please call [phone] between 1PM - 3PM on September 05, 2015 (Wednesday).  Remember: Instructions that are not followed completely may result in serious medical risk, up to and including death, or upon the discretion of your surgeon and anesthesiologist your surgery may need to be rescheduled.    __x__ 1. Do not eat food or drink liquids after midnight. No gum chewing or hard candies.     ____ 2. No Alcohol for 24 hours before or after surgery.   ____ 3. Bring all medications with you on the day of surgery if instructed.    __x__ 4. Notify your doctor if there is any change in your medical condition     (cold, fever, infections).     Do not wear jewelry, make-up, hairpins, clips or nail polish.  Do not wear lotions, powders, or perfumes. You may wear deodorant.  Do not shave 48 hours prior to surgery. Men may shave face and neck.  Do not bring valuables to the hospital.    [facility] is not responsible for any belongings or valuables.               Contacts, dentures or bridgework may not be worn into surgery.  Leave your suitcase in the car. After surgery it may be brought to your room.  For patients admitted to the hospital, discharge time is determined by your                treatment team.   Patients discharged the day of surgery will not be allowed to drive home.   Please read over the following fact sheets that you were given:   MRSA Information and Surgical Site Infection Prevention   ____ Take these medicines the morning of surgery with A SIP OF WATER:    1. Duloxetine  2. Fenofibrate  3. Omeprazole (Omeprazole at bedtime on April 26)  4.  5.  6.  ____ Fleet Enema (as directed)   _x___ Use CHG Soap as directed  ____ Use inhalers on the day of surgery  ____ Stop metformin 2 days prior to surgery    ____ Take 1/2 of  usual insulin dose the night before surgery and none on the morning of surgery.   _x__ Stop Coumadin/Plavix/aspirin on (STOP ASPIRIN ONE WEEK PRIOR TO SURGERY)  __x_ Stop Anti-inflammatories on (NO NSAIDS) Tylenol ok to take for pain if needed   ____ Stop supplements until after surgery.    __x__ Bring C-Pap to the hospital.

## 2015-09-06 ENCOUNTER — Inpatient Hospital Stay: Payer: 59 | Admitting: Anesthesiology

## 2015-09-06 ENCOUNTER — Encounter: Payer: Self-pay | Admitting: *Deleted

## 2015-09-06 ENCOUNTER — Encounter
Admission: RE | Disposition: A | Discharge status: Home / Self Care | Payer: Self-pay | Source: Ambulatory Visit | Attending: Surgery

## 2015-09-06 ENCOUNTER — Inpatient Hospital Stay
Admission: RE | Admit: 2015-09-06 | Discharge: 2015-09-11 | DRG: 330 | Disposition: A | Discharge status: Home / Self Care | Payer: 59 | Source: Ambulatory Visit | Attending: Surgery | Admitting: Surgery

## 2015-09-06 DIAGNOSIS — K219 Gastro-esophageal reflux disease without esophagitis: Secondary | ICD-10-CM | POA: Diagnosis present

## 2015-09-06 DIAGNOSIS — K5732 Diverticulitis of large intestine without perforation or abscess without bleeding: Principal | ICD-10-CM | POA: Diagnosis present

## 2015-09-06 DIAGNOSIS — E039 Hypothyroidism, unspecified: Secondary | ICD-10-CM | POA: Diagnosis present

## 2015-09-06 DIAGNOSIS — Z823 Family history of stroke: Secondary | ICD-10-CM

## 2015-09-06 DIAGNOSIS — Z7982 Long term (current) use of aspirin: Secondary | ICD-10-CM | POA: Diagnosis not present

## 2015-09-06 DIAGNOSIS — Z825 Family history of asthma and other chronic lower respiratory diseases: Secondary | ICD-10-CM | POA: Diagnosis not present

## 2015-09-06 DIAGNOSIS — Z79899 Other long term (current) drug therapy: Secondary | ICD-10-CM

## 2015-09-06 DIAGNOSIS — Z96641 Presence of right artificial hip joint: Secondary | ICD-10-CM | POA: Diagnosis present

## 2015-09-06 DIAGNOSIS — N736 Female pelvic peritoneal adhesions (postinfective): Secondary | ICD-10-CM | POA: Diagnosis present

## 2015-09-06 DIAGNOSIS — Z5331 Laparoscopic surgical procedure converted to open procedure: Secondary | ICD-10-CM | POA: Diagnosis not present

## 2015-09-06 DIAGNOSIS — G4730 Sleep apnea, unspecified: Secondary | ICD-10-CM | POA: Diagnosis present

## 2015-09-06 DIAGNOSIS — K660 Peritoneal adhesions (postprocedural) (postinfection): Secondary | ICD-10-CM | POA: Diagnosis present

## 2015-09-06 DIAGNOSIS — R710 Precipitous drop in hematocrit: Secondary | ICD-10-CM | POA: Diagnosis present

## 2015-09-06 DIAGNOSIS — I959 Hypotension, unspecified: Secondary | ICD-10-CM | POA: Diagnosis present

## 2015-09-06 DIAGNOSIS — Q7649 Other congenital malformations of spine, not associated with scoliosis: Secondary | ICD-10-CM | POA: Diagnosis not present

## 2015-09-06 HISTORY — PX: LAPAROSCOPIC SIGMOID COLECTOMY: SHX5928

## 2015-09-06 LAB — URINE DRUG SCREEN, QUALITATIVE (ARMC ONLY)
Amphetamines, Ur Screen: NOT DETECTED
BENZODIAZEPINE, UR SCRN: NOT DETECTED
Barbiturates, Ur Screen: NOT DETECTED
CANNABINOID 50 NG, UR ~~LOC~~: POSITIVE — AB
Cocaine Metabolite,Ur ~~LOC~~: NOT DETECTED
MDMA (ECSTASY) UR SCREEN: POSITIVE — AB
Methadone Scn, Ur: NOT DETECTED
Opiate, Ur Screen: NOT DETECTED
PHENCYCLIDINE (PCP) UR S: NOT DETECTED
Tricyclic, Ur Screen: NOT DETECTED

## 2015-09-06 LAB — HEMOGLOBIN AND HEMATOCRIT, BLOOD
HEMATOCRIT: 33.6 % — AB (ref 35.0–47.0)
HEMOGLOBIN: 11.2 g/dL — AB (ref 12.0–16.0)

## 2015-09-06 SURGERY — COLECTOMY, SIGMOID, LAPAROSCOPIC
Anesthesia: General | Wound class: Clean Contaminated

## 2015-09-06 MED ORDER — SODIUM CHLORIDE 0.9 % IV SOLN
1.0000 g | INTRAVENOUS | Status: AC
  Administered 2015-09-06: 1 g via INTRAVENOUS
  Filled 2015-09-06: qty 1

## 2015-09-06 MED ORDER — PROPOFOL 10 MG/ML IV BOLUS
INTRAVENOUS | Status: DC | PRN
  Administered 2015-09-06: 150 mg via INTRAVENOUS

## 2015-09-06 MED ORDER — ONDANSETRON HCL 4 MG/2ML IJ SOLN
INTRAMUSCULAR | Status: AC
Start: 2015-09-06 — End: 2015-09-06
  Administered 2015-09-06: 4 mg via INTRAVENOUS
  Filled 2015-09-06: qty 2

## 2015-09-06 MED ORDER — GLYCOPYRROLATE 0.2 MG/ML IJ SOLN
INTRAMUSCULAR | Status: DC | PRN
  Administered 2015-09-06: 0.4 mg via INTRAVENOUS

## 2015-09-06 MED ORDER — BUPIVACAINE-EPINEPHRINE 0.5% -1:200000 IJ SOLN
INTRAMUSCULAR | Status: DC | PRN
  Administered 2015-09-06: 30 mL

## 2015-09-06 MED ORDER — HYDROMORPHONE 1 MG/ML IV SOLN
INTRAVENOUS | Status: DC
  Administered 2015-09-06: 20:00:00 via INTRAVENOUS
  Administered 2015-09-07: 0 mg via INTRAVENOUS
  Administered 2015-09-07: 0.2 mg via INTRAVENOUS
  Administered 2015-09-07: 0.4 mg via INTRAVENOUS
  Administered 2015-09-07: 0.2 mg via INTRAVENOUS
  Administered 2015-09-07: 1.1 mg via INTRAVENOUS
  Administered 2015-09-07: 0.8 mg via INTRAVENOUS
  Administered 2015-09-08: 0 mg via INTRAVENOUS
  Administered 2015-09-08: 1.4 mg via INTRAVENOUS
  Administered 2015-09-08: 0.2 mg via INTRAVENOUS
  Filled 2015-09-06: qty 25

## 2015-09-06 MED ORDER — CYCLOBENZAPRINE HCL 10 MG PO TABS
5.0000 mg | ORAL_TABLET | Freq: Three times a day (TID) | ORAL | Status: DC
  Administered 2015-09-06 – 2015-09-08 (×7): 5 mg via ORAL
  Filled 2015-09-06 (×7): qty 1

## 2015-09-06 MED ORDER — FENTANYL CITRATE (PF) 100 MCG/2ML IJ SOLN
INTRAMUSCULAR | Status: DC | PRN
  Administered 2015-09-06: 50 ug via INTRAVENOUS
  Administered 2015-09-06: 100 ug via INTRAVENOUS
  Administered 2015-09-06: 250 ug via INTRAVENOUS
  Administered 2015-09-06: 50 ug via INTRAVENOUS
  Administered 2015-09-06 (×2): 100 ug via INTRAVENOUS
  Administered 2015-09-06 (×2): 50 ug via INTRAVENOUS

## 2015-09-06 MED ORDER — DIPHENHYDRAMINE HCL 50 MG/ML IJ SOLN: 12.5000 mg | Freq: Four times a day (QID) | INTRAMUSCULAR | Status: DC | PRN

## 2015-09-06 MED ORDER — DEXAMETHASONE SODIUM PHOSPHATE 4 MG/ML IJ SOLN
INTRAMUSCULAR | Status: DC | PRN
  Administered 2015-09-06: 5 mg via INTRAVENOUS

## 2015-09-06 MED ORDER — FENTANYL CITRATE (PF) 100 MCG/2ML IJ SOLN
INTRAMUSCULAR | Status: AC
  Administered 2015-09-06: 25 ug via INTRAVENOUS
  Filled 2015-09-06: qty 2

## 2015-09-06 MED ORDER — FUROSEMIDE 10 MG/ML IJ SOLN
INTRAMUSCULAR | Status: DC | PRN
  Administered 2015-09-06: 5 mg via INTRAMUSCULAR

## 2015-09-06 MED ORDER — LACTATED RINGERS IV SOLN
Freq: Once | INTRAVENOUS | Status: AC
  Administered 2015-09-06: 17:00:00 via INTRAVENOUS

## 2015-09-06 MED ORDER — CHLORHEXIDINE GLUCONATE 4 % EX LIQD: 1.0000 "application " | Freq: Once | CUTANEOUS | Status: DC

## 2015-09-06 MED ORDER — ROCURONIUM BROMIDE 100 MG/10ML IV SOLN
INTRAVENOUS | Status: DC | PRN
  Administered 2015-09-06: 20 mg via INTRAVENOUS
  Administered 2015-09-06 (×2): 10 mg via INTRAVENOUS
  Administered 2015-09-06: 5 mg via INTRAVENOUS
  Administered 2015-09-06: 50 mg via INTRAVENOUS
  Administered 2015-09-06: 10 mg via INTRAVENOUS

## 2015-09-06 MED ORDER — SODIUM CHLORIDE 0.9 % IV SOLN: INTRAVENOUS | Status: DC

## 2015-09-06 MED ORDER — DIPHENHYDRAMINE HCL 12.5 MG/5ML PO ELIX: 12.5000 mg | ORAL_SOLUTION | Freq: Four times a day (QID) | ORAL | Status: DC | PRN

## 2015-09-06 MED ORDER — ONDANSETRON 8 MG PO TBDP: 4.0000 mg | ORAL_TABLET | Freq: Four times a day (QID) | ORAL | Status: DC | PRN

## 2015-09-06 MED ORDER — ONDANSETRON HCL 4 MG/2ML IJ SOLN
4.0000 mg | Freq: Four times a day (QID) | INTRAMUSCULAR | Status: DC | PRN
  Administered 2015-09-06 – 2015-09-09 (×2): 4 mg via INTRAVENOUS
  Filled 2015-09-06 (×2): qty 2

## 2015-09-06 MED ORDER — ONDANSETRON HCL 4 MG/2ML IJ SOLN: 4.0000 mg | Freq: Once | INTRAMUSCULAR | Status: DC | PRN

## 2015-09-06 MED ORDER — NALOXONE HCL 0.4 MG/ML IJ SOLN: 0.4000 mg | INTRAMUSCULAR | Status: DC | PRN

## 2015-09-06 MED ORDER — ACETAMINOPHEN 10 MG/ML IV SOLN
INTRAVENOUS | Status: AC
  Filled 2015-09-06: qty 100

## 2015-09-06 MED ORDER — EPINEPHRINE HCL 0.1 MG/ML IJ SOSY
PREFILLED_SYRINGE | INTRAMUSCULAR | Status: DC | PRN
  Administered 2015-09-06: 15 ug via INTRAVENOUS
  Administered 2015-09-06: 20 ug via INTRAVENOUS

## 2015-09-06 MED ORDER — HEPARIN SODIUM (PORCINE) 5000 UNIT/ML IJ SOLN
INTRAMUSCULAR | Status: AC
  Administered 2015-09-06: 5000 [IU] via SUBCUTANEOUS
  Filled 2015-09-06: qty 1

## 2015-09-06 MED ORDER — ACETAMINOPHEN 500 MG PO TABS
1000.0000 mg | ORAL_TABLET | Freq: Four times a day (QID) | ORAL | Status: DC
  Administered 2015-09-06 – 2015-09-11 (×17): 1000 mg via ORAL
  Filled 2015-09-06 (×17): qty 2

## 2015-09-06 MED ORDER — NEOSTIGMINE METHYLSULFATE 10 MG/10ML IV SOLN
INTRAVENOUS | Status: DC | PRN
Start: 2015-09-06 — End: 2015-09-06
  Administered 2015-09-06: 3 mg via INTRAVENOUS

## 2015-09-06 MED ORDER — HEPARIN SODIUM (PORCINE) 5000 UNIT/ML IJ SOLN
5000.0000 [IU] | Freq: Once | INTRAMUSCULAR | Status: AC
  Administered 2015-09-06: 5000 [IU] via SUBCUTANEOUS

## 2015-09-06 MED ORDER — SODIUM CHLORIDE 0.9 % IJ SOLN
INTRAMUSCULAR | Status: AC
  Filled 2015-09-06: qty 50

## 2015-09-06 MED ORDER — ENOXAPARIN SODIUM 40 MG/0.4ML ~~LOC~~ SOLN
40.0000 mg | SUBCUTANEOUS | Status: DC
  Administered 2015-09-07 – 2015-09-11 (×5): 40 mg via SUBCUTANEOUS
  Filled 2015-09-06 (×5): qty 0.4

## 2015-09-06 MED ORDER — MIDAZOLAM HCL 2 MG/2ML IJ SOLN
INTRAMUSCULAR | Status: DC | PRN
  Administered 2015-09-06: 2 mg via INTRAVENOUS

## 2015-09-06 MED ORDER — LABETALOL HCL 5 MG/ML IV SOLN
INTRAVENOUS | Status: DC | PRN
  Administered 2015-09-06: 5 mg via INTRAVENOUS

## 2015-09-06 MED ORDER — LIDOCAINE HCL (CARDIAC) 20 MG/ML IV SOLN
INTRAVENOUS | Status: DC | PRN
  Administered 2015-09-06: 100 mg via INTRAVENOUS

## 2015-09-06 MED ORDER — KETAMINE HCL 50 MG/ML IJ SOLN
INTRAMUSCULAR | Status: DC | PRN
  Administered 2015-09-06: 50 mg via INTRAVENOUS

## 2015-09-06 MED ORDER — PANTOPRAZOLE SODIUM 40 MG IV SOLR
40.0000 mg | Freq: Every day | INTRAVENOUS | Status: DC
  Administered 2015-09-06 – 2015-09-07 (×2): 40 mg via INTRAVENOUS
  Filled 2015-09-06 (×2): qty 40

## 2015-09-06 MED ORDER — SODIUM CHLORIDE 0.9 % IJ SOLN
INTRAMUSCULAR | Status: DC | PRN
  Administered 2015-09-06: 50 mL via INTRAVENOUS

## 2015-09-06 MED ORDER — BUPIVACAINE-EPINEPHRINE (PF) 0.5% -1:200000 IJ SOLN
INTRAMUSCULAR | Status: AC
  Filled 2015-09-06: qty 30

## 2015-09-06 MED ORDER — ALBUMIN HUMAN 5 % IV SOLN
INTRAVENOUS | Status: AC
  Administered 2015-09-06 (×2): via INTRAVENOUS
  Filled 2015-09-06: qty 250

## 2015-09-06 MED ORDER — FENTANYL CITRATE (PF) 100 MCG/2ML IJ SOLN
25.0000 ug | INTRAMUSCULAR | Status: DC | PRN
  Administered 2015-09-06 (×5): 25 ug via INTRAVENOUS

## 2015-09-06 MED ORDER — ACETAMINOPHEN 10 MG/ML IV SOLN
INTRAVENOUS | Status: DC | PRN
  Administered 2015-09-06: 1000 mg via INTRAVENOUS

## 2015-09-06 MED ORDER — PHENYLEPHRINE HCL 10 MG/ML IJ SOLN
INTRAMUSCULAR | Status: DC | PRN
  Administered 2015-09-06 (×2): 200 ug via INTRAVENOUS
  Administered 2015-09-06: 300 ug via INTRAVENOUS
  Administered 2015-09-06: 100 ug via INTRAVENOUS

## 2015-09-06 MED ORDER — SODIUM CHLORIDE 0.9% FLUSH: 9.0000 mL | INTRAVENOUS | Status: DC | PRN

## 2015-09-06 MED ORDER — LACTATED RINGERS IV SOLN
INTRAVENOUS | Status: DC
  Administered 2015-09-06 (×5): via INTRAVENOUS

## 2015-09-06 MED ORDER — OXYCODONE HCL 5 MG PO TABS
5.0000 mg | ORAL_TABLET | ORAL | Status: DC | PRN
  Administered 2015-09-06: 5 mg via ORAL
  Administered 2015-09-09 – 2015-09-11 (×6): 10 mg via ORAL
  Filled 2015-09-06 (×6): qty 2
  Filled 2015-09-06 (×2): qty 1

## 2015-09-06 MED ORDER — ZOLPIDEM TARTRATE 5 MG PO TABS: 5.0000 mg | ORAL_TABLET | Freq: Every evening | ORAL | Status: DC | PRN

## 2015-09-06 MED ORDER — ALBUMIN HUMAN 5 % IV SOLN
INTRAVENOUS | Status: AC
  Filled 2015-09-06: qty 250

## 2015-09-06 MED ORDER — DEXTROSE IN LACTATED RINGERS 5 % IV SOLN
INTRAVENOUS | Status: DC
  Administered 2015-09-06 – 2015-09-08 (×6): via INTRAVENOUS

## 2015-09-06 MED ORDER — ONDANSETRON HCL 4 MG/2ML IJ SOLN: 4.0000 mg | Freq: Four times a day (QID) | INTRAMUSCULAR | Status: DC | PRN

## 2015-09-06 MED ORDER — BUPIVACAINE LIPOSOME 1.3 % IJ SUSP
INTRAMUSCULAR | Status: AC
  Filled 2015-09-06: qty 20

## 2015-09-06 MED ORDER — LACTATED RINGERS IV SOLN
500.0000 mL | INTRAVENOUS | Status: AC
  Administered 2015-09-06: 500 mL via INTRAVENOUS

## 2015-09-06 MED ORDER — BUPIVACAINE LIPOSOME 1.3 % IJ SUSP
INTRAMUSCULAR | Status: DC | PRN
  Administered 2015-09-06: 20 mL

## 2015-09-06 MED ORDER — ONDANSETRON HCL 4 MG/2ML IJ SOLN
INTRAMUSCULAR | Status: DC | PRN
  Administered 2015-09-06: 4 mg via INTRAVENOUS

## 2015-09-06 MED ORDER — METHOCARBAMOL 500 MG PO TABS
500.0000 mg | ORAL_TABLET | Freq: Four times a day (QID) | ORAL | Status: DC | PRN
  Filled 2015-09-06: qty 1

## 2015-09-06 MED ORDER — ONDANSETRON HCL 4 MG/2ML IJ SOLN
4.0000 mg | Freq: Once | INTRAMUSCULAR | Status: AC
  Administered 2015-09-06: 4 mg via INTRAVENOUS

## 2015-09-06 SURGICAL SUPPLY — 119 items
APPLIER CLIP 11 MED OPEN (CLIP)
APPLIER CLIP 13 LRG OPEN (CLIP) ×2
APPLIER CLIP 5 13 M/L LIGAMAX5 (MISCELLANEOUS) ×2
APR CLP LRG 13 20 CLIP (CLIP) ×1
APR CLP MED 11 20 MLT OPN (CLIP)
APR CLP MED LRG 5 ANG JAW (MISCELLANEOUS) ×1
BAG DECANTER FOR FLEXI CONT (MISCELLANEOUS) ×1 IMPLANT
BLADE CLIPPER SURG (BLADE) ×3 IMPLANT
BLADE SURG 15 STRL LF DISP TIS (BLADE) ×1 IMPLANT
BLADE SURG 15 STRL SS (BLADE) ×2
BLADE SURG SZ10 CARB STEEL (BLADE) ×1 IMPLANT
BLADE SURG SZ11 CARB STEEL (BLADE) ×1 IMPLANT
BNDG CMPR 75X21 PLY HI ABS (MISCELLANEOUS)
BULB RESERV EVAC DRAIN JP 100C (MISCELLANEOUS) ×2 IMPLANT
CANISTER SUCT 1200ML W/VALVE (MISCELLANEOUS) ×2 IMPLANT
CATH FOL LEG HOLDER (MISCELLANEOUS) ×2 IMPLANT
CATH ROBINSON RED A/P 16FR (CATHETERS) ×1 IMPLANT
CATH TRAY 16F METER LATEX (MISCELLANEOUS) ×1 IMPLANT
CHLORAPREP W/TINT 26ML (MISCELLANEOUS) ×2 IMPLANT
CLIP APPLIE 11 MED OPEN (CLIP) ×1 IMPLANT
CLIP APPLIE 13 LRG OPEN (CLIP) ×1 IMPLANT
CLIP APPLIE 5 13 M/L LIGAMAX5 (MISCELLANEOUS) IMPLANT
DECANTER SPIKE VIAL GLASS SM (MISCELLANEOUS) ×1 IMPLANT
DEFOGGER SCOPE WARMER CLEARIFY (MISCELLANEOUS) ×2 IMPLANT
DEVICE HAND ACCESS DEXTUS (MISCELLANEOUS) IMPLANT
DRAIN CHANNEL JP 19F (MISCELLANEOUS) ×1 IMPLANT
DRAIN JACKSON PRT FLT 10 (DRAIN) ×1 IMPLANT
DRAPE INCISE IOBAN 66X45 STRL (DRAPES) ×1 IMPLANT
DRAPE LAPAROTOMY T 102X78X121 (DRAPES) ×2 IMPLANT
DRAPE LEGGINS SURG 28X43 STRL (DRAPES) ×2 IMPLANT
DRAPE UNDER BUTTOCK W/FLU (DRAPES) ×2 IMPLANT
DRESSING TELFA 4X3 1S ST N-ADH (GAUZE/BANDAGES/DRESSINGS) ×1 IMPLANT
DRSG OPSITE POSTOP 4X12 (GAUZE/BANDAGES/DRESSINGS) ×1 IMPLANT
DRSG OPSITE POSTOP 4X8 (GAUZE/BANDAGES/DRESSINGS) ×1 IMPLANT
DRSG TEGADERM 2-3/8X2-3/4 SM (GAUZE/BANDAGES/DRESSINGS) ×2 IMPLANT
ELECT BLADE 6.5 EXT (BLADE) ×2 IMPLANT
ELECT CAUTERY BLADE 6.4 (BLADE) ×2 IMPLANT
ELECT REM PT RETURN 9FT ADLT (ELECTROSURGICAL) ×2
ELECTRODE REM PT RTRN 9FT ADLT (ELECTROSURGICAL) ×1 IMPLANT
GAUZE SPONGE 4X4 12PLY STRL (GAUZE/BANDAGES/DRESSINGS) ×1 IMPLANT
GAUZE STRETCH 2X75IN STRL (MISCELLANEOUS) ×1 IMPLANT
GELPORT LAPAROSCOPIC (MISCELLANEOUS) ×1 IMPLANT
GLOVE BIO SURGEON STRL SZ7 (GLOVE) ×4 IMPLANT
GOWN STRL REUS W/ TWL LRG LVL3 (GOWN DISPOSABLE) ×4 IMPLANT
GOWN STRL REUS W/TWL LRG LVL3 (GOWN DISPOSABLE) ×16
GOWN STRL REUS W/TWL LRG LVL4 (GOWN DISPOSABLE) ×4 IMPLANT
HANDLE SUCTION POOLE (INSTRUMENTS) ×1 IMPLANT
HANDLE YANKAUER SUCT BULB TIP (MISCELLANEOUS) ×2 IMPLANT
IRRIGATION STRYKERFLOW (MISCELLANEOUS) ×1 IMPLANT
IRRIGATOR STRYKERFLOW (MISCELLANEOUS) ×2
IV NS 1000ML (IV SOLUTION) ×2
IV NS 1000ML BAXH (IV SOLUTION) ×1 IMPLANT
KIT RM TURNOVER CYSTO AR (KITS) ×2 IMPLANT
L-HOOK LAP DISP 36CM (ELECTROSURGICAL)
LHOOK LAP DISP 36CM (ELECTROSURGICAL) ×1 IMPLANT
LIQUID BAND (GAUZE/BANDAGES/DRESSINGS) ×3 IMPLANT
MARKER SKIN W/RULER 31145785 (MISCELLANEOUS) ×1 IMPLANT
NDL HYPO 25X1 1.5 SAFETY (NEEDLE) ×1 IMPLANT
NEEDLE HYPO 22GX1.5 SAFETY (NEEDLE) ×2 IMPLANT
NEEDLE HYPO 25X1 1.5 SAFETY (NEEDLE) IMPLANT
NS IRRIG 1000ML POUR BTL (IV SOLUTION) ×2 IMPLANT
PACK BASIN MAJOR ARMC (MISCELLANEOUS) ×2 IMPLANT
PACK COLON CLEAN CLOSURE (MISCELLANEOUS) ×2 IMPLANT
PACK LAP CHOLECYSTECTOMY (MISCELLANEOUS) ×2 IMPLANT
PAD GROUND ADULT SPLIT (MISCELLANEOUS) ×2 IMPLANT
PAD PREP 24X41 OB/GYN DISP (PERSONAL CARE ITEMS) ×2 IMPLANT
PENCIL ELECTRO HAND CTR (MISCELLANEOUS) ×2 IMPLANT
PROT DEXTUS HAND ACCESS (MISCELLANEOUS) ×2
RELOAD BLUE (STAPLE) ×2 IMPLANT
RELOAD STAPLE 60 2.6 WHT THN (STAPLE) IMPLANT
RELOAD STAPLE 60 3.6 BLU REG (STAPLE) IMPLANT
RELOAD STAPLER BLUE 60MM (STAPLE) ×3 IMPLANT
RELOAD STAPLER WHITE 60MM (STAPLE) ×1 IMPLANT
RETRACT M ACCESS DEXTRUS (MISCELLANEOUS) ×4
RETRACTOR M ACCESS DEXTRUS (MISCELLANEOUS) IMPLANT
SCISSORS METZENBAUM CVD 33 (INSTRUMENTS) ×1 IMPLANT
SHEARS HARMONIC 23CM COAG (MISCELLANEOUS) ×1 IMPLANT
SHEARS HARMONIC ACE PLUS 36CM (ENDOMECHANICALS) ×1 IMPLANT
SLEEVE ENDOPATH XCEL 5M (ENDOMECHANICALS) ×3 IMPLANT
SOL PREP PVP 2OZ (MISCELLANEOUS) ×2
SOLUTION PREP PVP 2OZ (MISCELLANEOUS) ×1 IMPLANT
SPONGE DRAIN TRACH 4X4 STRL 2S (GAUZE/BANDAGES/DRESSINGS) ×1 IMPLANT
SPONGE LAP 18X18 5 PK (GAUZE/BANDAGES/DRESSINGS) ×9 IMPLANT
STAPLE ECHEON FLEX 60 POW ENDO (STAPLE) ×2 IMPLANT
STAPLER ENDO ILS CVD 18 33 (STAPLE) ×1 IMPLANT
STAPLER RELOAD BLUE 60MM (STAPLE) ×6
STAPLER RELOAD WHITE 60MM (STAPLE) ×2
STAPLER SKIN PROX 35W (STAPLE) ×2 IMPLANT
SUCT SIGMOIDOSCOPE TIP 18 W/TU (SUCTIONS) ×1 IMPLANT
SUCTION POOLE HANDLE (INSTRUMENTS) ×2
SURGILUBE 2OZ TUBE FLIPTOP (MISCELLANEOUS) ×2 IMPLANT
SUT ETHILON 3-0 FS-10 30 BLK (SUTURE) ×2
SUT MNCRL AB 4-0 PS2 18 (SUTURE) ×2 IMPLANT
SUT PDS AB 0 CT1 27 (SUTURE) ×3 IMPLANT
SUT PDS AB 1 CT  36 (SUTURE)
SUT PDS AB 1 CT 36 (SUTURE) ×1 IMPLANT
SUT PDS AB 1 TP1 54 (SUTURE) ×2 IMPLANT
SUT SILK 0 SH 30 (SUTURE) ×2 IMPLANT
SUT SILK 2 0 (SUTURE)
SUT SILK 2 0SH CR/8 30 (SUTURE) ×1 IMPLANT
SUT SILK 2-0 (SUTURE) ×1 IMPLANT
SUT SILK 2-0 18XBRD TIE 12 (SUTURE) ×1 IMPLANT
SUT SILK 3-0 (SUTURE) ×2 IMPLANT
SUT VIC AB 2-0 SH 27 (SUTURE) ×2
SUT VIC AB 2-0 SH 27XBRD (SUTURE) ×2 IMPLANT
SUT VIC AB 3-0 SH 27 (SUTURE)
SUT VIC AB 3-0 SH 27X BRD (SUTURE) ×2 IMPLANT
SUTURE EHLN 3-0 FS-10 30 BLK (SUTURE) IMPLANT
SYR 20CC LL (SYRINGE) ×1 IMPLANT
SYR 50ML LL SCALE MARK (SYRINGE) ×1 IMPLANT
SYRINGE IRR TOOMEY STRL 70CC (SYRINGE) ×1 IMPLANT
TOWEL OR 17X26 4PK STRL BLUE (TOWEL DISPOSABLE) ×4 IMPLANT
TRAY FOLEY W/METER SILVER 16FR (SET/KITS/TRAYS/PACK) ×2 IMPLANT
TROCAR 130MM GELPORT  DAV (MISCELLANEOUS) ×1 IMPLANT
TROCAR SL 12X100  BARIAT (TROCAR) ×2 IMPLANT
TROCAR XCEL NON-BLD 11X100MML (ENDOMECHANICALS) ×1 IMPLANT
TROCAR XCEL NON-BLD 5MMX100MML (ENDOMECHANICALS) ×2 IMPLANT
TROCAR XCEL UNIV SLVE 11M 100M (ENDOMECHANICALS) ×1 IMPLANT
TUBING INSUF HEATED (TUBING) ×1 IMPLANT

## 2015-09-06 NOTE — Transfer of Care (Signed)
Immediate Anesthesia Transfer of Care Note  Patient: [name] [name]  Procedure(s) Performed: Procedure(s): open sigmoid and descending colectomy, lysis of adhesions, omenteum flap (N/A)  Patient Location: PACU  Anesthesia Type:General  Level of Consciousness: awake and patient cooperative  Airway & Oxygen Therapy: Patient Spontanous Breathing and Patient connected to nasal cannula oxygen  Post-op Assessment: Report given to RN and Post -op Vital signs reviewed and stable  Post vital signs: Reviewed and stable  Last Vitals:  Filed Vitals:   09/06/15 0617  BP: 114/89  Pulse: 91  Temp: 36.4 C  Resp: 16    Last Pain: There were no vitals filed for this visit.       Complications: No apparent anesthesia complications

## 2015-09-06 NOTE — Interval H&P Note (Signed)
History and Physical Interval Note:  09/06/2015 7:15 AM  [name] [name]  has presented today for surgery, with the diagnosis of Complicated diverticulitis  The various methods of treatment have been discussed with the patient and family. After consideration of risks, benefits and other options for treatment, the patient has consented to  Procedure(s): LAPAROSCOPIC SIGMOID COLECTOMY (N/A) COLON RESECTION SIGMOID (N/A) as a surgical intervention .  The patient's history has been reviewed, patient examined, no change in status, stable for surgery.  I have reviewed the patient's chart and labs.  Questions were answered to the patient's satisfaction.     [place]

## 2015-09-06 NOTE — Anesthesia Procedure Notes (Signed)
Procedure Name: Intubation Date/Time: 09/06/2015 7:53 AM Performed by: Rosaria Ferries, Zelta Enfield Pre-anesthesia Checklist: Patient identified, Emergency Drugs available, Suction available and Patient being monitored Patient Re-evaluated:Patient Re-evaluated prior to inductionOxygen Delivery Method: Circle system utilized Preoxygenation: Pre-oxygenation with 100% oxygen Intubation Type: IV induction Laryngoscope Size: Mac and 3 Tube type: Oral Tube size: 7.0 mm Number of attempts: 1 Placement Confirmation: ETT inserted through vocal cords under direct vision,  positive ETCO2 and breath sounds checked- equal and bilateral Secured at: 21 cm Tube secured with: Tape Dental Injury: Teeth and Oropharynx as per pre-operative assessment

## 2015-09-06 NOTE — Anesthesia Preprocedure Evaluation (Addendum)
Anesthesia Evaluation  Patient identified by MRN, date of birth, ID band Patient awake    Reviewed: Allergy & Precautions, NPO status , Patient's Chart, lab work & pertinent test results, reviewed documented beta blocker date and time   Airway Mallampati: III  TM Distance: >3 FB     Dental  (+) Chipped, Upper Dentures, Dental Advisory Given, Poor Dentition   Pulmonary shortness of breath, sleep apnea , pneumonia, resolved,           Cardiovascular + dysrhythmias      Neuro/Psych  Headaches, PSYCHIATRIC DISORDERS Anxiety Depression    GI/Hepatic GERD  ,  Endo/Other  Hypothyroidism   Renal/GU      Musculoskeletal  (+) Arthritis ,   Abdominal   Peds  Hematology   Anesthesia Other Findings Neck movement OK. Hx of cocaine use long time ago. Now positive for cannaboids and ecstasy.  Reproductive/Obstetrics                            Anesthesia Physical Anesthesia Plan  ASA: III  Anesthesia Plan: General   Post-op Pain Management:    Induction: Intravenous  Airway Management Planned: Oral ETT  Additional Equipment:   Intra-op Plan:   Post-operative Plan:   Informed Consent: I have reviewed the patients History and Physical, chart, labs and discussed the procedure including the risks, benefits and alternatives for the proposed anesthesia with the patient or authorized representative who has indicated his/her understanding and acceptance.     Plan Discussed with: CRNA  Anesthesia Plan Comments:         Anesthesia Quick Evaluation

## 2015-09-06 NOTE — H&P (View-Only) (Signed)
Patient ID: [name] [name], female   DOB: [birth year], 60 y.o.   MRN: [id number]  History of Present Illness [name] [name] is a 60 y.o. female with complicated and recurrent diverticulitis with abscess. She now is symptom free, she is tolerating regular diet and having bowel movements. She recently had a colonoscopy by Dr. wall with tiny polyps removed. No evidence of malignancy. Diverticulosis noted She has good cardiovascular performance and denies any angina or dyspnea on exertion I have personally reviewed her CT scan and she did have a Doppler point a small abscess that significant improved   Past Medical History Past Medical History  Diagnosis Date  . Spine malformation     Kiari Malformation  . Thyroid disease   . GERD (gastroesophageal reflux disease)   . Irregular heart rate   . Hypothyroidism   . Depression   . Pneumonia     hx  . Sleep apnea     unable to wear mask-sleep study 3-4 yrs ago  . Diverticulitis   . Shortness of breath dyspnea     WITH EXERTION  . DDD (degenerative disc disease), lumbar     BACK, FINGER, WRIST  . DDD (degenerative disc disease), lumbar   . Wears dentures     UPPER     Past Surgical History  Procedure Laterality Date  . Tonsillectomy  as child  . Back surgery  1992    l4 and l5  . Craniectomy suboccipital w/ cervical laminectomy / chiari  08  . Carpal tunnel release Bilateral 2012  . Total hip arthroplasty Right 01/12/2013    Procedure: TOTAL HIP ARTHROPLASTY WITH AUTOGRAFT;  Surgeon: Ninetta Lights, MD;  Location: [facility];  Service: Orthopedics;  Laterality: Right;  . Appendectomy  60 years old  . Abdominal hysterectomy  60 years old  . Colonoscopy with propofol N/A 08/16/2015    Procedure: COLONOSCOPY WITH PROPOFOL;  Surgeon: Lucilla Lame, MD;  Location: [facility];  Service: Endoscopy;  Laterality: N/A;  . Polypectomy  08/16/2015    Procedure: POLYPECTOMY;  Surgeon: Lucilla Lame, MD;  Location: [facility];  Service:  Endoscopy;;    Allergies  Allergen Reactions  . Morphine     REACTION: Bradycardia, Hypotension  . Diphenhydramine Hcl Palpitations    Current Outpatient Prescriptions  Medication Sig Dispense Refill  . aspirin EC 81 MG tablet Take 81 mg by mouth daily. AM    . bisacodyl (DULCOLAX) 5 MG EC tablet Take 4 tablets (20 mg total) by mouth once. 4 tablet 0  . DULoxetine (CYMBALTA) 60 MG capsule Take 1 capsule by mouth daily. AM    . erythromycin base (E-MYCIN) 500 MG tablet Take 2 tablets (1,000 mg total) by mouth 3 (three) times daily. 6 tablet 0  . fenofibrate 160 MG tablet Take 160 mg by mouth daily. AM    . HYDROcodone-acetaminophen (NORCO/VICODIN) 5-325 MG tablet Take 1-2 tablets by mouth every 4 (four) hours as needed for moderate pain. 30 tablet 0  . levothyroxine (SYNTHROID, LEVOTHROID) 125 MCG tablet Take 125 mcg by mouth daily before breakfast.    . Multiple Vitamin (MULTIVITAMIN WITH MINERALS) TABS tablet Take 1 tablet by mouth daily. AM    . Na Sulfate-K Sulfate-Mg Sulf (SUPREP BOWEL PREP) SOLN Take 1 kit by mouth as directed. Take first bottle at 5:00 PM the day before procedure and the second bottle four hours prior to your procedure. 1 Bottle 0  . neomycin (MYCIFRADIN) 500 MG tablet Take 2 tablets (  1,000 mg total) by mouth 3 (three) times daily. 6 tablet 0  . omeprazole (PRILOSEC) 20 MG capsule Take 20 mg by mouth daily. AM    . ondansetron (ZOFRAN ODT) 4 MG disintegrating tablet Take 1 tablet (4 mg total) by mouth every 8 (eight) hours as needed for nausea or vomiting. 20 tablet 0  . polyethylene glycol powder (GLYCOLAX/MIRALAX) powder Take 255 g by mouth once. See Bowel Prep sheet provided 255 g 0  . traZODone (DESYREL) 100 MG tablet Take 100 mg by mouth at bedtime as needed for sleep.      No current facility-administered medications for this visit.    Family History Family History  Problem Relation Age of Onset  . COPD Mother   . Stroke Mother   . Cancer Father       Social History Social History  Substance Use Topics  . Smoking status: Never Smoker   . Smokeless tobacco: Never Used     Comment: 2004 last crack   occ alcohol  . Alcohol Use: No     Comment: 2 beers a year     ROS 10 pts review of system was otherwise negative   Physical Exam Blood pressure 132/83, pulse 85, temperature 97.4 F (36.3 C), temperature source Oral, height _0  (1.651 m), weight 91.354 kg (201 lb 6.4 oz).  CONSTITUTIONAL: NAD RESPIRATORY:  Lungs are clear, and breath sounds are equal bilaterally. Normal respiratory effort without pathologic use of accessory muscles. CARDIOVASCULAR: Heart is regular without murmurs, gallops, or rubs. GI: The abdomen is soft, nontender, and nondistended. There were no palpable masses. There was no hepatosplenomegaly. There were normal bowel sounds. MUSCULOSKELETAL:  Normal muscle strength and tone in all four extremities.    SKIN: Skin turgor is normal. There are no pathologic skin lesions.  NEUROLOGIC:  Motor and sensation is grossly normal.  Cranial nerves are grossly intact. PSYCH:  Alert and oriented to person, place and time. Affect is normal.  Data Reviewed  I have personally reviewed the patient's imaging and medical records.    Assessment/Plan .   Current and complicated diverticulitis with abscess now with significant improvement of symptoms. Comes for discussion of elective sigmoid colectomy. I have explained to the patient in detail about the proposed surgery which is a sigmoid colectomy laparoscopically possible open. Risk, benefits and possible complications including but not limited to: Bleeding, infection, anastomotic leak, need for colostomy, need for re-interventions. She understands and she wishes to proceed. I have also discussed with her the expected postoperative course and outcomes. Extensive counseling provided. Plan for elective lap sigmoid colectomy hand-assisted.  [place], MD [place] 08/23/2015, 10:33 AM

## 2015-09-06 NOTE — Progress Notes (Signed)
Patient seen in PACU at request of Dr. Marcello Moores who I spoke to personally Patient is oliguric and relatively hypotensive with a normal heart rate. Patient is awake alert and only having minimal abdominal pain. Vital signs are personally reviewed Abdomen is soft and minimally tender as expected postoperatively wounds are dressed with visual transparent dressing without sign of active bleeding. Calves are nontender  Oliguric patient postop 6 hour operation recommend rechecking H&H at this point and it is currently pending Dr. Marcello Moores has started some boluses and she may require additional fluids. This was discussed with the RN in the PACU and I will see the patient again later after the H&H is available

## 2015-09-06 NOTE — Op Note (Signed)
PROCEDURES: 1. Laparoscopic lysis of adhesions 2. Attempted Laparoscopic sigmoid colectomy 3. Laparoscopic takedown of splenic flexure 5. Low anterior resection with primary end-to-end staple anastomosis 4. Omental flap  Pre-operative Diagnosis: Complicated Diverticulitis  Post-operative Diagnosis: Same  Surgeon: Marjory Lies Pabon   Assistants: Dr. Burt Knack  Anesthesia: General endotracheal anesthesia  ASA Class: 2   Surgeon: Caroleen Hamman , MD FACS  Anesthesia: Gen. with endotracheal tube  Findings: The groin to the pelvic wall, from the small bowel to the sigmoid and interloop small bowel adhesions. There was some tendinous blood supply to the descending colon and therefore we mobilized the transverse colon to reach to the pelvis. There was adequate blood supply to the anastomosis and there was no evidence of any intraoperative leak  Estimated Blood Loss: 400cc         Drains: #19 [place] Blake drain pelvis         Specimens: Sigmoid colon and descending colon       Complications: none         Condition: stable  Procedure Details  The patient was seen again in the Holding Room. The benefits, complications, treatment options, and expected outcomes were discussed with the patient. The risks of bleeding, infection, recurrence of symptoms, failure to resolve symptoms,  bowel injury, any of which could require further surgery were reviewed with the patient.   The patient was taken to Operating Room, identified as [name] [name] and the procedure verified.  A Time Out was held and the above information confirmed.  Prior to the induction of general anesthesia, antibiotic prophylaxis was administered. VTE prophylaxis was in place. General endotracheal anesthesia was then administered and tolerated well. After the induction, the abdomen was prepped with Chloraprep and draped in the sterile fashion. The patient was positioned in the supine position. Prior to the induction of general  anesthesia, antibiotic prophylaxis was administered. VTE prophylaxis was in place. General endotracheal anesthesia was then administered and tolerated well. After the induction, the abdomen was prepped with Chloraprep and draped in the sterile fashion. The patient was positioned in lithotomy position.  7 cm incision was created as a midline mini laparotomy. The abdominal cavity was entered under direct visualization and the GelPort device was placed. A 5 mm port was placed in the suprapubic area under direct visualization and pneumoperitoneum was obtained. There were dense adhesions from the omentum to the abdominal wall that where lysed in the standard fashion with the Harmonic scalpel. We also were able to place a 12 mm port in the right lower quadrant and a 5 mm port in the left lower quadrant under direct visualization. There was significant adhesive disease in the pelvis from the sigmoid to the pelvic wall and also from the sigmoid to the SB. This adhesions were lysed with a combination of finger fracturing and Harmonic scalpel. The white line of Toldt was identified and divided and we mobilized the descending colon IN a lateral to medial fashion. We preserved the ureter at all times. We were also able to mobilize the splenic flexure using Harmonic scalpel in the standard fashion. We identified the takeoff of the inferior mesenteric artery dissected the pedicle and divided using a 60 mm vascular echelon stapler in the standard fashion. Using the Harmonic's scalpel were able to divide the mesorectum and and also divided proximal to the mesentery of the descending colon. Once we have an adequate visualization and mobilization we divided the sigmoid colon distally below the junction of the rectosigmoid area  mobilizing the peritoneal reflection, multiple blue loads using the echelon stapler. We removed the GelPort and visualized the colon in a direct fashion. There was good mobilization of the colon but the  descending portion and has simultaneous blood supply. At this point and because of exposure and to have an adequate mobilization we decided to convert to an open procedure. And laparotomy was extended in the standard fashion and were able to divide the inferior mesenteric vein to have an adequate mobilization. We were able to mobilize all the way the transverse colon to the pelvis. Once we had an adequate mobilization we felt very happy and divided the colon proximally with standard blue load stapler We opened the stapler line and a  33 mm dilator was perfect size. A 2-0 Vicryl pursestring was used to secure the anvil device. Dr. Burt Knack was able to pass a 33 mm standard EEA stapler device through the anus and we had a little bit of difficulty but were able to finally pass the device through the end of the rectal stump, I would add to actually resect a bit more of the rectal stump to have good exposure and visualization or the EEA stapler to deploy. Under direct visualization we perform an end to end anastomosis with the EEA device. A leak test was performed inflating the colon with a Toomey syringe and a rubber catheter. No evidence of leak was observed. There was also adequate hemostasis. A 19 Blake drain was placed in the pelvis. We were able to mobilize the omentum and I created an omental flap to attach it to the anastomosis. The drain was sutured in place with a 0 silks. All the laparoscopic ports were removed and a second look showed no evidence of any bleeding or any other injuries. We changed gloves and place a new tray to close the abdomen with a 0 PDS suture in a running fashion. The skin was closed with staplers . Liposomal Marcaine was injected on all incision sites under direct visualization. Dermabond was used to coat all the skin incisions. Needle and laparotomy count were correct and there were no immediate occasions  Caroleen Hamman, MD, FACS

## 2015-09-07 ENCOUNTER — Encounter: Payer: Self-pay | Admitting: Surgery

## 2015-09-07 LAB — CBC
HCT: 27.9 % — ABNORMAL LOW (ref 35.0–47.0)
HCT: 26.7 % — ABNORMAL LOW (ref 35.0–47.0)
Hemoglobin: 9.3 g/dL — ABNORMAL LOW (ref 12.0–16.0)
Hemoglobin: 9 g/dL — ABNORMAL LOW (ref 12.0–16.0)
MCH: 28.8 pg (ref 26.0–34.0)
MCH: 29.3 pg (ref 26.0–34.0)
MCHC: 33.3 g/dL (ref 32.0–36.0)
MCHC: 33.7 g/dL (ref 32.0–36.0)
MCV: 86.5 fL (ref 80.0–100.0)
MCV: 86.9 fL (ref 80.0–100.0)
Platelets: 233 10*3/uL (ref 150–440)
Platelets: 228 10*3/uL (ref 150–440)
RBC: 3.22 MIL/uL — ABNORMAL LOW (ref 3.80–5.20)
RBC: 3.07 MIL/uL — ABNORMAL LOW (ref 3.80–5.20)
RDW: 13.5 % (ref 11.5–14.5)
RDW: 13.9 % (ref 11.5–14.5)
WBC: 10 10*3/uL (ref 3.6–11.0)
WBC: 10 10*3/uL (ref 3.6–11.0)

## 2015-09-07 LAB — BASIC METABOLIC PANEL
Anion gap: 6 (ref 5–15)
BUN: 16 mg/dL (ref 6–20)
CO2: 23 mmol/L (ref 22–32)
Calcium: 8.5 mg/dL — ABNORMAL LOW (ref 8.9–10.3)
Chloride: 110 mmol/L (ref 101–111)
Creatinine, Ser: 1.25 mg/dL — ABNORMAL HIGH (ref 0.44–1.00)
GFR calc Af Amer: 54 mL/min — ABNORMAL LOW (ref >60)
GFR calc non Af Amer: 46 mL/min — ABNORMAL LOW (ref >60)
Glucose, Bld: 160 mg/dL — ABNORMAL HIGH (ref 65–99)
Potassium: 4.5 mmol/L (ref 3.5–5.1)
Sodium: 139 mmol/L (ref 135–145)

## 2015-09-07 LAB — SURGICAL PATHOLOGY

## 2015-09-07 NOTE — Plan of Care (Signed)
Problem: Activity: Goal: Ability to tolerate increased activity will improve Outcome: Progressing Pt was able to ambulate to the door and back to bed with just standby assist.

## 2015-09-07 NOTE — Progress Notes (Signed)
POD # 1 open LAR  Much improved, U/o and BP improved after crystalloid resuscitation. Likely IV depletion from prep  JP decrease output,  Pain controlled  PE NAD Abd: dressing intact, serousanguinous fluid JP, no peritonitis  A/P continue current care mobilize

## 2015-09-07 NOTE — Progress Notes (Signed)
1 Day Post-Op  Subjective: Status post laparoscopic sigmoid colon resection. She feels better today and is making better urine. She has minimal pain no nausea or vomiting  Objective: Vital signs in last 24 hours: Temp:  [97.4 F (36.3 C)-98.4 F (36.9 C)] 97.7 F (36.5 C) (04/28 0445) Pulse Rate:  [73-95] 81 (04/28 0454) Resp:  [8-24] 21 (04/28 0821) BP: (70-108)/(49-92) 97/56 mmHg (04/28 0454) SpO2:  [92 %-99 %] 96 % (04/28 0821) Last BM Date: 09/06/15  Intake/Output from previous day: 04/27 0701 - 04/28 0700 In: 7033 [P.O.:330; I.V.:6203; IV Piggyback:500] Out: 2225 [Urine:1655; Drains:170; Blood:400] Intake/Output this shift: Total I/O In: 515 [I.V.:515] Out: 1005 [Urine:1000; Drains:5]  Physical exam:  Abdomen is soft and minimally distended minimally tender wounds are covered and dressed appropriately with no sign of bleeding or erythema. Calves are nontender  Lab Results: CBC   Recent Labs  09/06/15 1455 09/07/15 0414  WBC  --  10.0  HGB 11.2* 9.3*  HCT 33.6* 27.9*  PLT  --  233   BMET  Recent Labs  09/07/15 0414  NA 139  K 4.5  CL 110  CO2 23  GLUCOSE 160*  BUN 16  CREATININE 1.25*  CALCIUM 8.5*   PT/INR No results for input(s): LABPROT, INR in the last 72 hours. ABG No results for input(s): PHART, HCO3 in the last 72 hours.  Invalid input(s): PCO2, PO2  Studies/Results: No results found.  Anti-infectives: Anti-infectives    Start     Dose/Rate Route Frequency Ordered Stop   09/06/15 0237  ertapenem (INVANZ) 1 g in sodium chloride 0.9 % 50 mL IVPB     1 g 100 mL/hr over 30 Minutes Intravenous On call to O.R. 09/06/15 0237 09/06/15 0830      Assessment/Plan: s/p Procedure(s): open sigmoid and descending colectomy, lysis of adhesions, omenteum flap   Small but significant drop in hemoglobin but patient required considerable fluid resuscitation suggesting that this could be dilutional in nature. Will recheck in the morning and  continue to monitor vital signs she shows no sign of active bleeding at this point.  Florene Glen, MD, FACS  09/07/2015

## 2015-09-07 NOTE — Progress Notes (Signed)
MD called in regards to RN having to reinforce jp drain dsg twice today. MD stated to reinforce as needed.

## 2015-09-07 NOTE — Plan of Care (Signed)
Problem: Nutrition: Goal: Ability to attain and maintain optimal nutritional status will improve Outcome: Progressing Pt is able to tolerate clear liquids.

## 2015-09-08 LAB — COMPREHENSIVE METABOLIC PANEL
ALBUMIN: 2.8 g/dL — AB (ref 3.5–5.0)
ALK PHOS: 31 U/L — AB (ref 38–126)
ALT: 22 U/L (ref 14–54)
AST: 27 U/L (ref 15–41)
Anion gap: 5 (ref 5–15)
BUN: 12 mg/dL (ref 6–20)
CALCIUM: 8.6 mg/dL — AB (ref 8.9–10.3)
CHLORIDE: 110 mmol/L (ref 101–111)
CO2: 27 mmol/L (ref 22–32)
CREATININE: 1.08 mg/dL — AB (ref 0.44–1.00)
GFR calc Af Amer: >60 mL/min (ref >60)
GFR calc non Af Amer: 55 mL/min — ABNORMAL LOW (ref >60)
GLUCOSE: 114 mg/dL — AB (ref 65–99)
Potassium: 3.9 mmol/L (ref 3.5–5.1)
Sodium: 142 mmol/L (ref 135–145)
Total Bilirubin: 0.4 mg/dL (ref 0.3–1.2)
Total Protein: 4.8 g/dL — ABNORMAL LOW (ref 6.5–8.1)

## 2015-09-08 LAB — CBC WITH DIFFERENTIAL/PLATELET
Basophils Absolute: 0.1 10*3/uL (ref 0–0.1)
Basophils Relative: 1 %
EOS PCT: 3 %
Eosinophils Absolute: 0.2 10*3/uL (ref 0–0.7)
HCT: 24.3 % — ABNORMAL LOW (ref 35.0–47.0)
Hemoglobin: 8.1 g/dL — ABNORMAL LOW (ref 12.0–16.0)
LYMPHS ABS: 2.1 10*3/uL (ref 1.0–3.6)
LYMPHS PCT: 26 %
MCH: 29.5 pg (ref 26.0–34.0)
MCHC: 33.2 g/dL (ref 32.0–36.0)
MCV: 88.8 fL (ref 80.0–100.0)
MONO ABS: 0.6 10*3/uL (ref 0.2–0.9)
MONOS PCT: 8 %
Neutro Abs: 4.8 10*3/uL (ref 1.4–6.5)
Neutrophils Relative %: 62 %
PLATELETS: 211 10*3/uL (ref 150–440)
RBC: 2.74 MIL/uL — AB (ref 3.80–5.20)
RDW: 13.9 % (ref 11.5–14.5)
WBC: 7.8 10*3/uL (ref 3.6–11.0)

## 2015-09-08 MED ORDER — HYDROMORPHONE HCL 1 MG/ML IJ SOLN
0.5000 mg | INTRAMUSCULAR | Status: DC | PRN
  Administered 2015-09-08 – 2015-09-10 (×6): 0.5 mg via INTRAVENOUS
  Filled 2015-09-08 (×6): qty 1

## 2015-09-08 MED ORDER — PANTOPRAZOLE SODIUM 40 MG PO TBEC
40.0000 mg | DELAYED_RELEASE_TABLET | Freq: Two times a day (BID) | ORAL | Status: DC
  Administered 2015-09-08 – 2015-09-11 (×7): 40 mg via ORAL
  Filled 2015-09-08 (×7): qty 1

## 2015-09-08 NOTE — Anesthesia Postprocedure Evaluation (Signed)
Anesthesia Post Note  Patient: [name] [name]  Procedure(s) Performed: Procedure(s) (LRB): open sigmoid and descending colectomy, lysis of adhesions, omenteum flap (N/A)  Patient location during evaluation: PACU Anesthesia Type: General Level of consciousness: awake and alert Pain management: pain level controlled Vital Signs Assessment: post-procedure vital signs reviewed and stable Respiratory status: spontaneous breathing, nonlabored ventilation, respiratory function stable and patient connected to nasal cannula oxygen Cardiovascular status: blood pressure returned to baseline and stable Postop Assessment: no signs of nausea or vomiting Anesthetic complications: no    Last Vitals:  Filed Vitals:   09/08/15 0355 09/08/15 0551  BP:  102/61  Pulse:  79  Temp:  36.8 C  Resp: 15 18    Last Pain:  Filed Vitals:   09/08/15 0736  PainSc: [place]

## 2015-09-08 NOTE — Progress Notes (Signed)
POD # 2 Great U/O AVSS No flatus but feels better, minimal pain Anemia dilutional from crystalloid and colloid resuscitation   PE NAD, awake and alert Abd: soft, decrease BS, dressing intact, serous drainage from JP Ext: well perfused  A/P Doing well Watch hb, no evidence of bleeding DC foley Mobilize Keep on clears DC PCA, transition to IV dilaudid prn

## 2015-09-09 LAB — BASIC METABOLIC PANEL
ANION GAP: 5 (ref 5–15)
BUN: 11 mg/dL (ref 6–20)
CALCIUM: 8.7 mg/dL — AB (ref 8.9–10.3)
CO2: 27 mmol/L (ref 22–32)
Chloride: 108 mmol/L (ref 101–111)
Creatinine, Ser: 1.1 mg/dL — ABNORMAL HIGH (ref 0.44–1.00)
GFR, EST NON AFRICAN AMERICAN: 54 mL/min — AB (ref >60)
Glucose, Bld: 91 mg/dL (ref 65–99)
Potassium: 3.8 mmol/L (ref 3.5–5.1)
SODIUM: 140 mmol/L (ref 135–145)

## 2015-09-09 LAB — CBC
HCT: 26.2 % — ABNORMAL LOW (ref 35.0–47.0)
HEMOGLOBIN: 8.7 g/dL — AB (ref 12.0–16.0)
MCH: 28.9 pg (ref 26.0–34.0)
MCHC: 33.2 g/dL (ref 32.0–36.0)
MCV: 87 fL (ref 80.0–100.0)
PLATELETS: 254 10*3/uL (ref 150–440)
RBC: 3.01 MIL/uL — AB (ref 3.80–5.20)
RDW: 13.8 % (ref 11.5–14.5)
WBC: 7.5 10*3/uL (ref 3.6–11.0)

## 2015-09-09 MED ORDER — CYCLOBENZAPRINE HCL 10 MG PO TABS
10.0000 mg | ORAL_TABLET | Freq: Three times a day (TID) | ORAL | Status: DC
  Administered 2015-09-09 – 2015-09-11 (×7): 10 mg via ORAL
  Filled 2015-09-09 (×8): qty 1

## 2015-09-09 NOTE — Progress Notes (Signed)
Seen and examined. AVSS Doing well, had BM and is passing flatus. Good U/O. Abd soft, incisions c/d/i.  She is taking some PO but had some nausea.  She hurts when she moves.  Will add some ice packs No new issues Continue current care Plan to DC Tuesday.

## 2015-09-09 NOTE — Progress Notes (Signed)
POD # 4 S/P OPEN LAR \ Passed flatus, ambulated VSS, tolerating PO Hb improving Creat ok Good U/O  PE NAD Abd: incision c/d/i, no peritonitis , serous output from drain  A/P doing well Mobilize heplock iv Advance diet DC next 48-72 hrs

## 2015-09-10 LAB — CBC
HEMATOCRIT: 24.8 % — AB (ref 35.0–47.0)
Hemoglobin: 8.4 g/dL — ABNORMAL LOW (ref 12.0–16.0)
MCH: 29.7 pg (ref 26.0–34.0)
MCHC: 33.8 g/dL (ref 32.0–36.0)
MCV: 87.8 fL (ref 80.0–100.0)
Platelets: 279 10*3/uL (ref 150–440)
RBC: 2.82 MIL/uL — ABNORMAL LOW (ref 3.80–5.20)
RDW: 13.5 % (ref 11.5–14.5)
WBC: 6.4 10*3/uL (ref 3.6–11.0)

## 2015-09-10 LAB — BASIC METABOLIC PANEL
ANION GAP: 6 (ref 5–15)
BUN: 10 mg/dL (ref 6–20)
CALCIUM: 8.3 mg/dL — AB (ref 8.9–10.3)
CO2: 27 mmol/L (ref 22–32)
CREATININE: 1.01 mg/dL — AB (ref 0.44–1.00)
Chloride: 107 mmol/L (ref 101–111)
GLUCOSE: 94 mg/dL (ref 65–99)
POTASSIUM: 3.5 mmol/L (ref 3.5–5.1)
SODIUM: 140 mmol/L (ref 135–145)

## 2015-09-10 MED ORDER — HYDROMORPHONE HCL 1 MG/ML IJ SOLN
0.5000 mg | INTRAMUSCULAR | Status: DC | PRN
  Administered 2015-09-10: 0.5 mg via INTRAVENOUS
  Filled 2015-09-10: qty 1

## 2015-09-10 NOTE — Progress Notes (Signed)
60yr old female POD#4 Open LAR for recurrent diverticulitis.  Patient has been doing well. Patient has been tolerating a soft diet without any nausea or vomiting. Patient has been up and moving around. Patient states her pain is controlled well with the pain medications.  Filed Vitals:   09/10/15 0534 09/10/15 1312  BP: 108/60 99/61  Pulse: 77 80  Temp: 97.7 F (36.5 C) 97.9 F (36.6 C)  Resp: 16 17   I/O last 3 completed shifts: In: 2330.5 [P.O.:1020; I.V.:1310.5] Out: 3257.5 [Urine:3225; Drains:32.5] Total I/O In: 630 [P.O.:630] Out: 1360 [Urine:1350; Drains:10]   PE:  Gen: NAD Abd: Soft, mildly distended, incision closed with staples clean dry and intact, right sided JP drain  with a scant amount recorded in bulb however with a lot of serous fluid around the drain on the dressings had to be changed 3 times today. Ext: 2+ pulses, no edema  CBC Latest Ref Rng 09/10/2015 09/09/2015 09/08/2015  WBC 3.6 - 11.0 K/uL 6.4 7.5 7.8  Hemoglobin 12.0 - 16.0 g/dL 8.4(L) 8.7(L) 8.1(L)  Hematocrit 35.0 - 47.0 % 24.8(L) 26.2(L) 24.3(L)  Platelets 150 - 440 K/uL 279 254 211   CMP Latest Ref Rng 09/10/2015 09/09/2015 09/08/2015  Glucose 65 - 99 mg/dL 94 91 114(H)  BUN 6 - 20 mg/dL 10 11 12   Creatinine 0.44 - 1.00 mg/dL 1.01(H) 1.10(H) 1.08(H)  Sodium 135 - 145 mmol/L 140 140 142  Potassium 3.5 - 5.1 mmol/L 3.5 3.8 3.9  Chloride 101 - 111 mmol/L 107 108 110  CO2 22 - 32 mmol/L 27 27 27   Calcium 8.9 - 10.3 mg/dL 8.3(L) 8.7(L) 8.6(L)  Total Protein 6.5 - 8.1 g/dL - - 4.8(L)  Total Bilirubin 0.3 - 1.2 mg/dL - - 0.4  Alkaline Phos 38 - 126 U/L - - 31(L)  AST 15 - 41 U/L - - 27  ALT 14 - 54 U/L - - 22    A/P: 60yr old female POD#4 Open LAR for recurrent diverticulitis. Improving  Advance to regular diet Encourage mobilzation Pain well controlled Likely d/c home tomorrow.

## 2015-09-11 MED ORDER — HYDROCODONE-ACETAMINOPHEN 5-325 MG PO TABS: 1.0000 | ORAL_TABLET | ORAL | Status: DC | PRN

## 2015-09-11 NOTE — Discharge Summary (Signed)
Physician Discharge Summary  Patient ID: [name] [name] MRN: [id number] DOB/AGE: 1956-01-20 60 y.o.  Admit date: 09/06/2015 Discharge date: 09/11/2015  Admission Diagnoses: Diverticulitis  Discharge Diagnoses:  Active Problems:   Diverticulitis of colon   Diverticulitis large intestine   Discharged Condition: good  Hospital Course: 60yr old female POD#4 Open LAR for recurrent diverticulitis.  Patient doing well, ambulating without difficulty.  She is tolerating regular diet.  She has had BM and passing flatus, pain well controlled with po meds.   Consults: None  Significant Diagnostic Studies: none  Treatments: surgery: Open LAR  Discharge Exam: Blood pressure 102/56, pulse 77, temperature 97.6 F (36.4 C), temperature source Oral, resp. rate 18, height 5' 5.75" (1.67 m), weight 201 lb (91.173 kg), SpO2 93 %. General appearance: alert, cooperative and no distress GI: soft, appropriately tender, incision c/d/i with staples in place, JP drain minimal drainage in bulb, serous on dressings Extremities: extremities normal, atraumatic, no cyanosis or edema  Disposition: 01-Home or Self Care  Discharge Instructions    Diet - low sodium heart healthy    Complete by:  As directed      Discharge wound care:    Complete by:  As directed   No dressing needed to incision sites if no drainage, may place dry dressing to drain site for drainage as needed     Driving Restrictions    Complete by:  As directed   No driving while on prescription pain medication     Increase activity slowly    Complete by:  As directed      Lifting restrictions    Complete by:  As directed   No lifting over 15lbs for 5 weeks     May shower / Bathe    Complete by:  As directed      May walk up steps    Complete by:  As directed             Medication List    STOP taking these medications        bisacodyl 5 MG EC tablet  Commonly known as:  DULCOLAX     erythromycin base 500 MG tablet  Commonly  known as:  E-MYCIN     Na Sulfate-K Sulfate-Mg Sulf Soln  Commonly known as:  SUPREP BOWEL PREP     neomycin 500 MG tablet  Commonly known as:  MYCIFRADIN     polyethylene glycol powder powder  Commonly known as:  GLYCOLAX/MIRALAX      TAKE these medications        aspirin EC 81 MG tablet  Take 81 mg by mouth daily. AM     DULoxetine 60 MG capsule  Commonly known as:  CYMBALTA  Take 1 capsule by mouth daily. AM     fenofibrate 160 MG tablet  Take 160 mg by mouth daily. AM     HYDROcodone-acetaminophen 5-325 MG tablet  Commonly known as:  NORCO/VICODIN  Take 1-2 tablets by mouth every 4 (four) hours as needed for moderate pain or severe pain.     levothyroxine 125 MCG tablet  Commonly known as:  SYNTHROID, LEVOTHROID  Take 125 mcg by mouth daily before breakfast.     multivitamin with minerals Tabs tablet  Take 1 tablet by mouth daily. AM     omeprazole 20 MG capsule  Commonly known as:  PRILOSEC  Take 20 mg by mouth daily. AM     ondansetron 4 MG disintegrating tablet  Commonly known as:  ZOFRAN ODT  Take 1 tablet (4 mg total) by mouth every 8 (eight) hours as needed for nausea or vomiting.     traZODone 100 MG tablet  Commonly known as:  DESYREL  Take 100 mg by mouth at bedtime as needed for sleep.           Follow-up Information    Follow up with Jules Husbands, MD In 1 week.   Specialty:  General Surgery   Why:  f/u sigmiod colectomy Dr. Dahlia Byes in 7-10 days   Contact information:   [address] [place] [address] [phone]       Signed: Hubbard Robinson 09/11/2015, 8:01 AM

## 2015-09-11 NOTE — Progress Notes (Signed)
Patient discharged home with family.  All discharge instructions reviewed and discharge paperwork given to patient.  Patient verbalized understanding.  IV removed in tact. Prescriptions given to patient with all questions and concerns addressed.  JP drain removed, pt tolerated well.  Wound care explained.  Patient husband at bedside for transfer home.

## 2015-09-13 ENCOUNTER — Telehealth: Payer: Self-pay

## 2015-09-13 NOTE — Telephone Encounter (Signed)
[employer] disability forms was filled out and faxed.

## 2015-09-17 ENCOUNTER — Other Ambulatory Visit: Payer: Self-pay

## 2015-09-19 ENCOUNTER — Encounter: Payer: Self-pay | Admitting: General Surgery

## 2015-09-19 ENCOUNTER — Ambulatory Visit (INDEPENDENT_AMBULATORY_CARE_PROVIDER_SITE_OTHER): Payer: 59 | Admitting: General Surgery

## 2015-09-19 DIAGNOSIS — Z4889 Encounter for other specified surgical aftercare: Secondary | ICD-10-CM

## 2015-09-19 MED ORDER — HYDROCODONE-ACETAMINOPHEN 5-325 MG PO TABS: 1.0000 | ORAL_TABLET | Freq: Four times a day (QID) | ORAL | Status: DC | PRN

## 2015-09-19 NOTE — Progress Notes (Signed)
Outpatient Surgical Follow Up  09/19/2015  [name] [name] is an 60 y.o. female.   Chief Complaint  Patient presents with  . Routine Post Op    sigmoid colectomy    HPI: 60 year old female returns to clinic for follow-up 11 days status post laparoscopic converted open sigmoid colectomy. She reports continued to be sore but it is gradually improving. She continues to need to oral pain medications. She denies any fevers, chills, nausea, vomiting, diarrhea, constipation. She is having a daily soft bowel movement. She does report that after eating her abdominal discomfort seems to increase but then gradually go away again. She's been very happy with her surgical experience.  Past Medical History  Diagnosis Date  . Spine malformation     Kiari Malformation  . Thyroid disease   . GERD (gastroesophageal reflux disease)   . Irregular heart rate   . Hypothyroidism   . Depression   . Pneumonia     hx  . Sleep apnea     unable to wear mask-sleep study 3-4 yrs ago  . Diverticulitis   . Shortness of breath dyspnea     WITH EXERTION  . DDD (degenerative disc disease), lumbar     BACK, FINGER, WRIST  . DDD (degenerative disc disease), lumbar   . Wears dentures     UPPER  . Dysrhythmia   . Anxiety   . Headache     Past Surgical History  Procedure Laterality Date  . Tonsillectomy  as child  . Back surgery  1992    l4 and l5  . Craniectomy suboccipital w/ cervical laminectomy / chiari  08  . Carpal tunnel release Bilateral 2012  . Total hip arthroplasty Right 01/12/2013    Procedure: TOTAL HIP ARTHROPLASTY WITH AUTOGRAFT;  Surgeon: Ninetta Lights, MD;  Location: [facility];  Service: Orthopedics;  Laterality: Right;  . Appendectomy  60 years old  . Abdominal hysterectomy  60 years old  . Colonoscopy with propofol N/A 08/16/2015    Procedure: COLONOSCOPY WITH PROPOFOL;  Surgeon: Lucilla Lame, MD;  Location: [facility];  Service: Endoscopy;  Laterality: N/A;  . Polypectomy  08/16/2015   Procedure: POLYPECTOMY;  Surgeon: Lucilla Lame, MD;  Location: [facility];  Service: Endoscopy;;  . Joint replacement    . Spinal fusion    . Laparoscopic sigmoid colectomy N/A 09/06/2015    Procedure: open sigmoid and descending colectomy, lysis of adhesions, omenteum flap;  Surgeon: Jules Husbands, MD;  Location: [facility];  Service: General;  Laterality: N/A;    Family History  Problem Relation Age of Onset  . COPD Mother   . Stroke Mother   . Cancer Father     Social History:  reports that she has never smoked. She has never used smokeless tobacco. She reports that she does not drink alcohol or use illicit drugs.  Allergies:  Allergies  Allergen Reactions  . Morphine     REACTION: Bradycardia, Hypotension  . Diphenhydramine Hcl Palpitations    Medications reviewed.    ROS A multipoint review of systems was completed, all pertinent positives and negatives were documented within the history of present illness and remainder are negative.   There were no vitals taken for this visit.  Physical Exam Gen.: No acute distress Chest: Clear to auscultation Heart: Regular rhythm Abdomen: Soft, purple tender to palpation along midline, nondistended. Staples and placed all surgical sites without evidence of erythema or drainage.    No results found for this or  any previous visit (from the past 48 hour(s)). No results found.  Assessment/Plan:  1. Aftercare following surgery 60 year old female status post laparoscopic converted open sigmoid colectomy. Doing well. Staples removed today and replaced with Steri-Strips. Discussed appropriate timeframe for resolution of abdominal pain and soreness. She voiced understanding. We provided her with an additional prescription of pain medications today and she will follow-up in 1 week with her operative surgeon in [place].     Clayburn Pert, MD FACS General Surgeon  09/19/2015,11:32 AM

## 2015-09-19 NOTE — Patient Instructions (Signed)
Please call our office if you have questions or concerns.   

## 2015-09-20 ENCOUNTER — Telehealth: Payer: Self-pay

## 2015-09-20 NOTE — Telephone Encounter (Signed)
Disability form was filled out and faxed-from [employer].

## 2015-10-03 ENCOUNTER — Encounter: Payer: Self-pay | Admitting: Surgery

## 2015-10-03 ENCOUNTER — Ambulatory Visit (INDEPENDENT_AMBULATORY_CARE_PROVIDER_SITE_OTHER): Payer: 59 | Admitting: Surgery

## 2015-10-03 VITALS — BP 122/84 | HR 80 | Temp 97.8°F | Wt 197.0 lb

## 2015-10-03 DIAGNOSIS — K5732 Diverticulitis of large intestine without perforation or abscess without bleeding: Secondary | ICD-10-CM

## 2015-10-03 DIAGNOSIS — K5720 Diverticulitis of large intestine with perforation and abscess without bleeding: Secondary | ICD-10-CM

## 2015-10-03 NOTE — Progress Notes (Signed)
60 yr old female s/p open colectomy for diverticulitis on 4/27 with Dr. Dahlia Byes.  Patient has been doing very well since the operation.  She is eating well and having good Bms.  She states only some occasional tinges of pain along the incision sites.  She would like to return to work.   Filed Vitals:   10/03/15 1539  BP: 122/84  Pulse: 80  Temp: 97.8 F (36.6 C)   PE:  Gen:NAD Abd: soft, incision c/d/i, non tender   A/P:  Patient healing well from open colectomy.  She will return to work off lifting restrictions in 1 week.  FMLA paperwork filled out.  She can return to clinic with any issues or concerns.

## 2015-10-15 ENCOUNTER — Telehealth: Payer: Self-pay

## 2015-10-15 NOTE — Telephone Encounter (Signed)
Per patient's request, she wanted me to fill out and fax a disability form to [phone].

## 2016-06-12 DIAGNOSIS — B349 Viral infection, unspecified: Secondary | ICD-10-CM | POA: Diagnosis not present

## 2016-06-12 DIAGNOSIS — R05 Cough: Secondary | ICD-10-CM | POA: Diagnosis not present

## 2016-09-01 DIAGNOSIS — R198 Other specified symptoms and signs involving the digestive system and abdomen: Secondary | ICD-10-CM | POA: Diagnosis not present

## 2016-09-01 DIAGNOSIS — R110 Nausea: Secondary | ICD-10-CM | POA: Diagnosis not present

## 2016-09-23 DIAGNOSIS — M25552 Pain in left hip: Secondary | ICD-10-CM | POA: Diagnosis not present

## 2016-09-25 DIAGNOSIS — Z01818 Encounter for other preprocedural examination: Secondary | ICD-10-CM | POA: Diagnosis not present

## 2016-09-25 DIAGNOSIS — M1612 Unilateral primary osteoarthritis, left hip: Secondary | ICD-10-CM | POA: Diagnosis not present

## 2016-10-22 DIAGNOSIS — M25552 Pain in left hip: Secondary | ICD-10-CM | POA: Diagnosis not present

## 2016-10-22 NOTE — H&P (Signed)
TOTAL HIP ADMISSION H&P  Patient is admitted for left total hip arthroplasty.  Subjective:  Chief Complaint: left hip pain  HPI: [name] [name], 61 y.o. female, has a history of pain and functional disability in the left hip(s) due to arthritis and patient has failed non-surgical conservative treatments for greater than 12 weeks to include NSAID's and/or analgesics and activity modification.  Onset of symptoms was gradual starting 3 years ago with rapidlly worsening course since that time.The patient noted no past surgery on the left hip(s).  Patient currently rates pain in the left hip at 8 out of 10 with activity. Patient has night pain, worsening of pain with activity and weight bearing, trendelenberg gait and pain that interfers with activities of daily living. Patient has evidence of subchondral sclerosis and joint space narrowing by imaging studies. This condition presents safety issues increasing the risk of falls.  There is no current active infection.  Patient Active Problem List   Diagnosis Date Noted  . Generalized abdominal pain 01/08/2015  . Obesity 01/08/2015  . Dizziness 01/08/2015  . HYPOTHYROIDISM 11/07/2009  . CALLUSES, LEFT FOOT 11/07/2009  . FASCIITIS, Valentine 11/07/2009   Past Medical History:  Diagnosis Date  . Anxiety   . DDD (degenerative disc disease), lumbar    BACK, FINGER, WRIST  . DDD (degenerative disc disease), lumbar   . Depression   . Diverticulitis   . Dysrhythmia   . GERD (gastroesophageal reflux disease)   . Headache   . Hypothyroidism   . Irregular heart rate   . Pneumonia    hx  . Shortness of breath dyspnea    WITH EXERTION  . Sleep apnea    unable to wear mask-sleep study 3-4 yrs ago  . Spine malformation    Kiari Malformation  . Thyroid disease   . Wears dentures    UPPER    Past Surgical History:  Procedure Laterality Date  . ABDOMINAL HYSTERECTOMY  61 years old  . APPENDECTOMY  61 years old  . BACK SURGERY  1992   l4 and l5   . CARPAL TUNNEL RELEASE Bilateral 2012  . COLONOSCOPY WITH PROPOFOL N/A 08/16/2015   Procedure: COLONOSCOPY WITH PROPOFOL;  Surgeon: Lucilla Lame, MD;  Location: [facility];  Service: Endoscopy;  Laterality: N/A;  . CRANIECTOMY SUBOCCIPITAL W/ CERVICAL LAMINECTOMY / CHIARI  08  . JOINT REPLACEMENT    . LAPAROSCOPIC SIGMOID COLECTOMY N/A 09/06/2015   Procedure: open sigmoid and descending colectomy, lysis of adhesions, omenteum flap;  Surgeon: Jules Husbands, MD;  Location: [facility];  Service: General;  Laterality: N/A;  . POLYPECTOMY  08/16/2015   Procedure: POLYPECTOMY;  Surgeon: Lucilla Lame, MD;  Location: [facility];  Service: Endoscopy;;  . SPINAL FUSION    . TONSILLECTOMY  as child  . TOTAL HIP ARTHROPLASTY Right 01/12/2013   Procedure: TOTAL HIP ARTHROPLASTY WITH AUTOGRAFT;  Surgeon: Ninetta Lights, MD;  Location: [facility];  Service: Orthopedics;  Laterality: Right;    No prescriptions prior to admission.   Allergies  Allergen Reactions  . Morphine     REACTION: Bradycardia, Hypotension  . Diphenhydramine Hcl Palpitations    Social History  Substance Use Topics  . Smoking status: Never Smoker  . Smokeless tobacco: Never Used     Comment: 2004 last crack   occ alcohol  . Alcohol use No     Comment: 2 beers a year    Family History  Problem Relation Age of Onset  . COPD  Mother   . Stroke Mother   . Cancer Father      Review of Systems  Constitutional: Negative.   HENT: Positive for hearing loss.   Eyes: Negative.   Respiratory: Negative.   Cardiovascular: Negative.   Gastrointestinal: Positive for heartburn.  Genitourinary: Positive for frequency and urgency.  Musculoskeletal: Positive for joint pain.  Skin: Negative.   Neurological: Negative.   Endo/Heme/Allergies: Negative.   Psychiatric/Behavioral: The patient is nervous/anxious and has insomnia.     Objective:  Physical Exam  Constitutional: She is oriented to person, place, and time. She  appears well-developed and well-nourished.  HENT:  Head: Normocephalic and atraumatic.  Eyes: EOM are normal. Pupils are equal, round, and reactive to light.  Neck: Normal range of motion. Neck supple.  Cardiovascular: Normal rate and regular rhythm.   Respiratory: Effort normal and breath sounds normal.  GI: Soft. Bowel sounds are normal.  Musculoskeletal:  Left hip positive log roll.  Internal rotation to 15 degrees, external rotation to 35 degrees while seated.  She is neurovascularly intact distally.    Neurological: She is alert and oriented to person, place, and time.  Skin: Skin is warm and dry.  Psychiatric: She has a normal mood and affect. Her behavior is normal. Judgment and thought content normal.    Vital signs in last 24 hours:    Labs:   Estimated body mass index is 32.04 kg/m as calculated from the following:   Height as of 09/06/15: 5' 5.75" (1.67 m).   Weight as of 10/03/15: 89.4 kg (197 lb).   Imaging Review Plain radiographs demonstrate severe degenerative joint disease of the left hip(s). The bone quality appears to be fair for age and reported activity level.  Assessment/Plan:  End stage arthritis, left hip(s)  The patient history, physical examination, clinical judgement of the provider and imaging studies are consistent with end stage degenerative joint disease of the left hip(s) and total hip arthroplasty is deemed medically necessary. The treatment options including medical management, injection therapy, arthroscopy and arthroplasty were discussed at length. The risks and benefits of total hip arthroplasty were presented and reviewed. The risks due to aseptic loosening, infection, stiffness, dislocation/subluxation,  thromboembolic complications and other imponderables were discussed.  The patient acknowledged the explanation, agreed to proceed with the plan and consent was signed. Patient is being admitted for inpatient treatment for surgery, pain control,  PT, OT, prophylactic antibiotics, VTE prophylaxis, progressive ambulation and ADL's and discharge planning.The patient is planning to be discharged home with home health services

## 2016-10-24 ENCOUNTER — Encounter (HOSPITAL_COMMUNITY)
Admission: RE | Admit: 2016-10-24 | Discharge: 2016-10-24 | Disposition: A | Discharge status: Home / Self Care | Payer: 59 | Source: Ambulatory Visit | Attending: Orthopedic Surgery | Admitting: Orthopedic Surgery

## 2016-10-24 ENCOUNTER — Encounter (HOSPITAL_COMMUNITY): Payer: Self-pay

## 2016-10-24 DIAGNOSIS — Z01818 Encounter for other preprocedural examination: Secondary | ICD-10-CM | POA: Insufficient documentation

## 2016-10-24 DIAGNOSIS — M1712 Unilateral primary osteoarthritis, left knee: Secondary | ICD-10-CM | POA: Insufficient documentation

## 2016-10-24 LAB — COMPREHENSIVE METABOLIC PANEL
ALBUMIN: 4.4 g/dL (ref 3.5–5.0)
ALK PHOS: 64 U/L (ref 38–126)
ALT: 24 U/L (ref 14–54)
AST: 30 U/L (ref 15–41)
Anion gap: 9 (ref 5–15)
BUN: 13 mg/dL (ref 6–20)
CALCIUM: 9.5 mg/dL (ref 8.9–10.3)
CO2: 23 mmol/L (ref 22–32)
CREATININE: 1.14 mg/dL — AB (ref 0.44–1.00)
Chloride: 107 mmol/L (ref 101–111)
GFR calc Af Amer: 60 mL/min — ABNORMAL LOW (ref >60)
GFR calc non Af Amer: 52 mL/min — ABNORMAL LOW (ref >60)
GLUCOSE: 107 mg/dL — AB (ref 65–99)
Potassium: 3.7 mmol/L (ref 3.5–5.1)
SODIUM: 139 mmol/L (ref 135–145)
Total Bilirubin: 0.5 mg/dL (ref 0.3–1.2)
Total Protein: 6.9 g/dL (ref 6.5–8.1)

## 2016-10-24 LAB — URINALYSIS, COMPLETE (UACMP) WITH MICROSCOPIC
BACTERIA UA: NONE SEEN
Bilirubin Urine: NEGATIVE
Glucose, UA: NEGATIVE mg/dL
Ketones, ur: NEGATIVE mg/dL
Nitrite: POSITIVE — AB
Protein, ur: NEGATIVE mg/dL
SPECIFIC GRAVITY, URINE: 1.014 (ref 1.005–1.030)
pH: 7 (ref 5.0–8.0)

## 2016-10-24 LAB — SURGICAL PCR SCREEN
MRSA, PCR: NEGATIVE
STAPHYLOCOCCUS AUREUS: NEGATIVE

## 2016-10-24 LAB — TYPE AND SCREEN
ABO/RH(D): O POS
ANTIBODY SCREEN: NEGATIVE

## 2016-10-24 LAB — CBC
HCT: 40.3 % (ref 36.0–46.0)
HEMOGLOBIN: 13 g/dL (ref 12.0–15.0)
MCH: 28.6 pg (ref 26.0–34.0)
MCHC: 32.3 g/dL (ref 30.0–36.0)
MCV: 88.8 fL (ref 78.0–100.0)
Platelets: 303 10*3/uL (ref 150–400)
RBC: 4.54 MIL/uL (ref 3.87–5.11)
RDW: 13.3 % (ref 11.5–15.5)
WBC: 9.5 10*3/uL (ref 4.0–10.5)

## 2016-10-24 NOTE — Pre-Procedure Instructions (Signed)
[name] [name]  10/24/2016      [id number] - [address], [address] [place] [address] Phone: [phone] Fax: [phone]    Your procedure is scheduled on Wednesday, November 05, 2016.  Report to [facility] Admitting at 0830 A.M.  Call this number if you have problems the morning of surgery:  [phone]   Remember:  Do not eat food or drink liquids after midnight.  Take these medicines the morning of surgery with A SIP OF WATER: Duloxetine (Cymbalta), Hydrocodone-acetaminophen (Norco) - if needed, levothyroxine (Synthroid), and omeprazole (Prilosec)  7 days prior to surgery STOP taking any Aspirin, Aleve, Naproxen, Ibuprofen, Motrin, Advil, Goody's, BC's, all herbal medications, fish oil, and all vitamins    Do not wear jewelry, make-up or nail polish.  Do not wear lotions, powders, or perfumes, or deodorant.  Do not shave 48 hours prior to surgery.  Men may shave face and neck.  Do not bring valuables to the hospital.  [facility] is not responsible for any belongings or valuables.  Contacts, dentures or bridgework may not be worn into surgery.  Leave your suitcase in the car.  After surgery it may be brought to your room.  For patients admitted to the hospital, discharge time will be determined by your treatment team.  Patients discharged the day of surgery will not be allowed to drive home.   Name and phone number of your driver:    Special instructions:    [place]- Preparing For Surgery  Before surgery, you can play an important role. Because skin is not sterile, your skin needs to be as free of germs as possible. You can reduce the number of germs on your skin by washing with CHG (chlorahexidine gluconate) Soap before surgery.  CHG is an antiseptic cleaner which kills germs and bonds with the skin to continue killing germs even after washing.  Please do not use if you have an allergy  to CHG or antibacterial soaps. If your skin becomes reddened/irritated stop using the CHG.  Do not shave (including legs and underarms) for at least 48 hours prior to first CHG shower. It is OK to shave your face.  Please follow these instructions carefully.   1. Shower the NIGHT BEFORE SURGERY and the MORNING OF SURGERY with CHG.   2. If you chose to wash your hair, wash your hair first as usual with your normal shampoo.  3. After you shampoo, rinse your hair and body thoroughly to remove the shampoo.  4. Use CHG as you would any other liquid soap. You can apply CHG directly to the skin and wash gently with a scrungie or a clean washcloth.   5. Apply the CHG Soap to your body ONLY FROM THE NECK DOWN.  Do not use on open wounds or open sores. Avoid contact with your eyes, ears, mouth and genitals (private parts). Wash genitals (private parts) with your normal soap.  6. Wash thoroughly, paying special attention to the area where your surgery will be performed.  7. Thoroughly rinse your body with warm water from the neck down.  8. DO NOT shower/wash with your normal soap after using and rinsing off the CHG Soap.  9. Pat yourself dry with a CLEAN TOWEL.   10. Wear CLEAN PAJAMAS   11. Place CLEAN SHEETS on your bed the night of your first shower and DO NOT SLEEP WITH PETS.  Day of Surgery: Do not apply any deodorants/lotions. Please wear clean clothes to the hospital/surgery center.      Please read over the following fact sheets that you were given. Pain Booklet, Total Joint Packet, MRSA Information and Surgical Site Infection Prevention, and Incentive Spirometry

## 2016-10-24 NOTE — Progress Notes (Addendum)
PCP: Dr. Jeremy Johann @ [place] at [facility] ekg/clearance note  Pt. States she uses marijuana every night for sleep. Stated she would stop several days prior to surgery.  Left message @ Dr. Debroah Loop office of pt's abnormal ua.

## 2016-10-26 LAB — URINE CULTURE

## 2016-10-27 NOTE — Progress Notes (Addendum)
Anesthesia Chart Review: Patient is a 61 year old female scheduled for left THA, anterior approach on 11/05/16 by Dr. Kathryne Hitch.   History includes never smoker, GERD, Chiari malformation s/p subocciptial craniectomy '05 with C1-2 laminectomy 08/31/03, OSA (intolerant to CPAP mask), dysrhythmia ("irregular heart rate" without mention of afib), anxiety, hypothyroidism, hysterectomy, appendectomy, tonsillectomy, L4-5 PLIF 10/23/41, complicated diverticulitis with abscess s/p LOA/attempted laparoscopic sigmoid colectomy/lowanterior resection with primary end-to-end staple anastomosis/omental flap 09/06/15, right THA '14. She uses marijuana for sleep (plans to refrain for a few days prior to surgery).  PCP is listed as Dr. Carol Ada with [facility] with preoperative clearance done by Dr. Damaris Hippo of [facility] @ [facility]. She signed clearance from a medical and cardiac standpoint and added, "Creatinine function slightly elevated. Advised adequate hydration prior to and after surgery." Patient is not routinely followed by a cardiologist, but had a stress test in 2011 with Dr. Candee Furbish for evaluation of chest pain. He felt results were "Overall reassuring." No further testing unless worsening symptoms. She has had a chronically abnormal EKG dating back to at least 2001.   Meds include ASA 81 mg (on hold), Cymbalta, fenofibrate, Norco, levothyroxine, Prilosec, trazodone.  BP 133/75   Pulse 87   Temp 37 C   Resp 20   Ht 5\' 5"  (1.651 m)   Wt 217 lb 6 oz (98.6 kg)   SpO2 95%   BMI 36.17 kg/m   EKG 09/25/16 [facility]): SR, RSR (V1) non-diagnostic, non-specific T-abnormality. Patient has ST/T wave abnormality in precordial leads. She has over 25 comparison EKG tracing in [facility] dating back to 08/31/99. Her 09/25/16 EKG including ST/T wave abnormality appears similar to 08/31/99 tracing. I printed off several tracing from [place] (04/18/03, 11/29/08) and 12/07/12 tracing (scanned under Media tab,  Correspondence, 01/12/13, as part of medical clearance by Dr. Carol Ada from 2014 right THA in which Dr. Tamala Julian contacted Dr. Marlou Porch then who did not feel that patient needed cardiology re-evaluation unless she was having symptoms such as CP, SOB) that also appear to have similar findings. Patient has had a low risk stress tests in 2006 and 2011.   Echo 01/17/11 (found under Notes tab): Conclusions:  Normal LV systolic function, EF 15-40%, trivial PR/TR.  Nuclear stress test 07/04/09 (done for evaluation of chest pain; Dr. Marlou Porch, formerly with [facility]; scanned under Media tab, Correspondence, 01/12/13): Conclusions: Overall, a low risk nuclear stress test. There is no evidence of any significant ischemia present. Normal LVEF. If symptoms worsen or become more worrisome, further cardiac testing may be warranted. Baseline EKG showed SR with subtle T wave inversion in V2-3.  Preoperative labs noted. Cr 1.14. Glucose 107. CBC WNL. T&S done.  Urine culture showed > 100,000 colonies E. Coli (result called to Sherri at Dr. Debroah Loop office). Defer treatment recommendations for UTI to surgeon.  EKG appears stable for > 15 years. Patient has had two stress tests during this time. She had recent medical evaluation with EKG and subsequent clearance for surgery. If no acute changes or new cardiopulmonary symptoms then I would anticipate that she can proceed as planned.  [facility]/Anesthesiology Phone [phone] 10/27/2016 1:44 PM

## 2016-11-04 MED ORDER — TRANEXAMIC ACID 1000 MG/10ML IV SOLN
1000.0000 mg | INTRAVENOUS | Status: AC
  Administered 2016-11-05: 1000 mg via INTRAVENOUS
  Filled 2016-11-04: qty 10

## 2016-11-04 NOTE — Anesthesia Preprocedure Evaluation (Addendum)
Anesthesia Evaluation  Patient identified by MRN, date of birth, ID band Patient awake    Reviewed: Allergy & Precautions, NPO status , Patient's Chart, lab work & pertinent test results  Airway Mallampati: II  TM Distance: >3 FB Neck ROM: Full    Dental no notable dental hx. (+) Edentulous Upper, Poor Dentition, Missing, Loose, Partial Lower   Pulmonary neg pulmonary ROS,    Pulmonary exam normal breath sounds clear to auscultation       Cardiovascular negative cardio ROS Normal cardiovascular exam Rhythm:Regular Rate:Normal     Neuro/Psych negative neurological ROS  negative psych ROS   GI/Hepatic negative GI ROS, Neg liver ROS,   Endo/Other  negative endocrine ROS  Renal/GU negative Renal ROS  negative genitourinary   Musculoskeletal negative musculoskeletal ROS (+)   Abdominal   Peds negative pediatric ROS (+)  Hematology negative hematology ROS (+)   Anesthesia Other Findings   Reproductive/Obstetrics negative OB ROS                            Anesthesia Physical Anesthesia Plan  ASA: III  Anesthesia Plan: Spinal   Post-op Pain Management:    Induction:   PONV Risk Score and Plan: 2 and Ondansetron, Dexamethasone, Metaclopromide and Treatment may vary due to age or medical condition  Airway Management Planned: Nasal Cannula  Additional Equipment:   Intra-op Plan:   Post-operative Plan:   Informed Consent: I have reviewed the patients History and Physical, chart, labs and discussed the procedure including the risks, benefits and alternatives for the proposed anesthesia with the patient or authorized representative who has indicated his/her understanding and acceptance.   Dental advisory given  Plan Discussed with:   Anesthesia Plan Comments: (  )       Anesthesia Quick Evaluation

## 2016-11-05 ENCOUNTER — Inpatient Hospital Stay (HOSPITAL_COMMUNITY): Payer: 59 | Admitting: Anesthesiology

## 2016-11-05 ENCOUNTER — Inpatient Hospital Stay (HOSPITAL_COMMUNITY)
Admission: RE | Admit: 2016-11-05 | Discharge: 2016-11-07 | DRG: 470 | Disposition: A | Discharge status: Home Health | Payer: 59 | Source: Ambulatory Visit | Attending: Orthopedic Surgery | Admitting: Orthopedic Surgery

## 2016-11-05 ENCOUNTER — Inpatient Hospital Stay (HOSPITAL_COMMUNITY): Payer: 59 | Admitting: Vascular Surgery

## 2016-11-05 ENCOUNTER — Encounter (HOSPITAL_COMMUNITY): Payer: Self-pay | Admitting: Anesthesiology

## 2016-11-05 ENCOUNTER — Encounter (HOSPITAL_COMMUNITY)
Admission: RE | Disposition: A | Discharge status: Home Health | Payer: Self-pay | Source: Ambulatory Visit | Attending: Orthopedic Surgery

## 2016-11-05 ENCOUNTER — Inpatient Hospital Stay (HOSPITAL_COMMUNITY): Payer: 59

## 2016-11-05 DIAGNOSIS — E039 Hypothyroidism, unspecified: Secondary | ICD-10-CM | POA: Diagnosis present

## 2016-11-05 DIAGNOSIS — M1612 Unilateral primary osteoarthritis, left hip: Principal | ICD-10-CM | POA: Diagnosis present

## 2016-11-05 DIAGNOSIS — K219 Gastro-esophageal reflux disease without esophagitis: Secondary | ICD-10-CM | POA: Diagnosis present

## 2016-11-05 DIAGNOSIS — R1084 Generalized abdominal pain: Secondary | ICD-10-CM | POA: Diagnosis not present

## 2016-11-05 DIAGNOSIS — D62 Acute posthemorrhagic anemia: Secondary | ICD-10-CM | POA: Diagnosis not present

## 2016-11-05 DIAGNOSIS — G4730 Sleep apnea, unspecified: Secondary | ICD-10-CM | POA: Diagnosis present

## 2016-11-05 DIAGNOSIS — Z6836 Body mass index (BMI) 36.0-36.9, adult: Secondary | ICD-10-CM

## 2016-11-05 DIAGNOSIS — E669 Obesity, unspecified: Secondary | ICD-10-CM | POA: Diagnosis present

## 2016-11-05 DIAGNOSIS — Z96642 Presence of left artificial hip joint: Secondary | ICD-10-CM | POA: Diagnosis not present

## 2016-11-05 DIAGNOSIS — M25552 Pain in left hip: Secondary | ICD-10-CM | POA: Diagnosis not present

## 2016-11-05 DIAGNOSIS — G4700 Insomnia, unspecified: Secondary | ICD-10-CM | POA: Diagnosis present

## 2016-11-05 DIAGNOSIS — Z471 Aftercare following joint replacement surgery: Secondary | ICD-10-CM | POA: Diagnosis not present

## 2016-11-05 DIAGNOSIS — Z419 Encounter for procedure for purposes other than remedying health state, unspecified: Secondary | ICD-10-CM

## 2016-11-05 HISTORY — DX: Unilateral primary osteoarthritis, left hip: M16.12

## 2016-11-05 HISTORY — PX: TOTAL HIP ARTHROPLASTY: SHX124

## 2016-11-05 SURGERY — ARTHROPLASTY, HIP, TOTAL, ANTERIOR APPROACH
Anesthesia: Spinal | Site: Hip | Laterality: Left

## 2016-11-05 MED ORDER — BISACODYL 5 MG PO TBEC: 5.0000 mg | DELAYED_RELEASE_TABLET | Freq: Every day | ORAL | Status: DC | PRN

## 2016-11-05 MED ORDER — LEVOTHYROXINE SODIUM 25 MCG PO TABS
125.0000 ug | ORAL_TABLET | Freq: Every day | ORAL | Status: DC
  Administered 2016-11-06 – 2016-11-07 (×2): 125 ug via ORAL
  Filled 2016-11-05 (×2): qty 1

## 2016-11-05 MED ORDER — ACETAMINOPHEN 650 MG RE SUPP: 650.0000 mg | Freq: Four times a day (QID) | RECTAL | Status: DC | PRN

## 2016-11-05 MED ORDER — HYDROMORPHONE HCL 1 MG/ML IJ SOLN
0.5000 mg | INTRAMUSCULAR | Status: DC | PRN
  Administered 2016-11-05 – 2016-11-06 (×6): 1 mg via INTRAVENOUS
  Filled 2016-11-05 (×7): qty 1

## 2016-11-05 MED ORDER — BUPIVACAINE HCL (PF) 0.5 % IJ SOLN
INTRAMUSCULAR | Status: AC
  Filled 2016-11-05: qty 30

## 2016-11-05 MED ORDER — DEXAMETHASONE SODIUM PHOSPHATE 10 MG/ML IJ SOLN
INTRAMUSCULAR | Status: AC
  Filled 2016-11-05: qty 1

## 2016-11-05 MED ORDER — FENTANYL CITRATE (PF) 100 MCG/2ML IJ SOLN: 25.0000 ug | INTRAMUSCULAR | Status: DC | PRN

## 2016-11-05 MED ORDER — POLYETHYLENE GLYCOL 3350 17 G PO PACK: 17.0000 g | PACK | Freq: Every day | ORAL | Status: DC | PRN

## 2016-11-05 MED ORDER — MAGNESIUM CITRATE PO SOLN: 1.0000 | Freq: Once | ORAL | Status: DC | PRN

## 2016-11-05 MED ORDER — CEFAZOLIN SODIUM-DEXTROSE 2-4 GM/100ML-% IV SOLN
2.0000 g | Freq: Four times a day (QID) | INTRAVENOUS | Status: AC
  Administered 2016-11-05 (×2): 2 g via INTRAVENOUS
  Filled 2016-11-05 (×3): qty 100

## 2016-11-05 MED ORDER — DULOXETINE HCL 60 MG PO CPEP
120.0000 mg | ORAL_CAPSULE | Freq: Every day | ORAL | Status: DC
  Administered 2016-11-06 – 2016-11-07 (×2): 120 mg via ORAL
  Filled 2016-11-05 (×2): qty 2

## 2016-11-05 MED ORDER — ONDANSETRON HCL 4 MG/2ML IJ SOLN: 4.0000 mg | Freq: Four times a day (QID) | INTRAMUSCULAR | Status: DC | PRN

## 2016-11-05 MED ORDER — METHOCARBAMOL 500 MG PO TABS
500.0000 mg | ORAL_TABLET | Freq: Four times a day (QID) | ORAL | Status: DC | PRN
  Administered 2016-11-05 – 2016-11-07 (×4): 500 mg via ORAL
  Filled 2016-11-05 (×4): qty 1

## 2016-11-05 MED ORDER — ONDANSETRON HCL 4 MG PO TABS: 4.0000 mg | ORAL_TABLET | Freq: Four times a day (QID) | ORAL | Status: DC | PRN

## 2016-11-05 MED ORDER — PHENOL 1.4 % MT LIQD: 1.0000 | OROMUCOSAL | Status: DC | PRN

## 2016-11-05 MED ORDER — POTASSIUM CHLORIDE IN NACL 20-0.9 MEQ/L-% IV SOLN
INTRAVENOUS | Status: DC
  Administered 2016-11-05 – 2016-11-06 (×2): via INTRAVENOUS
  Filled 2016-11-05 (×2): qty 1000

## 2016-11-05 MED ORDER — OXYCODONE HCL 5 MG PO TABS
5.0000 mg | ORAL_TABLET | ORAL | Status: DC | PRN
  Administered 2016-11-05 – 2016-11-07 (×7): 10 mg via ORAL
  Filled 2016-11-05 (×7): qty 2

## 2016-11-05 MED ORDER — ACETAMINOPHEN 325 MG PO TABS
650.0000 mg | ORAL_TABLET | Freq: Four times a day (QID) | ORAL | Status: DC | PRN
  Administered 2016-11-05: 650 mg via ORAL
  Filled 2016-11-05: qty 2

## 2016-11-05 MED ORDER — CHLORHEXIDINE GLUCONATE 4 % EX LIQD: 60.0000 mL | Freq: Once | CUTANEOUS | Status: DC

## 2016-11-05 MED ORDER — METHOCARBAMOL 500 MG PO TABS: 500.0000 mg | ORAL_TABLET | Freq: Four times a day (QID) | ORAL | 0 refills | Status: DC

## 2016-11-05 MED ORDER — PROPOFOL 1000 MG/100ML IV EMUL
INTRAVENOUS | Status: AC
  Filled 2016-11-05: qty 100

## 2016-11-05 MED ORDER — FENOFIBRATE 160 MG PO TABS
160.0000 mg | ORAL_TABLET | Freq: Every day | ORAL | Status: DC
  Administered 2016-11-05 – 2016-11-07 (×3): 160 mg via ORAL
  Filled 2016-11-05 (×2): qty 1

## 2016-11-05 MED ORDER — ONDANSETRON HCL 4 MG/2ML IJ SOLN
INTRAMUSCULAR | Status: AC
  Filled 2016-11-05: qty 2

## 2016-11-05 MED ORDER — FENTANYL CITRATE (PF) 250 MCG/5ML IJ SOLN
INTRAMUSCULAR | Status: AC
  Filled 2016-11-05: qty 5

## 2016-11-05 MED ORDER — PHENYLEPHRINE HCL 10 MG/ML IJ SOLN
INTRAVENOUS | Status: DC | PRN
  Administered 2016-11-05: 25 ug/min via INTRAVENOUS

## 2016-11-05 MED ORDER — PROPOFOL 500 MG/50ML IV EMUL
INTRAVENOUS | Status: DC | PRN
  Administered 2016-11-05: 75 ug/kg/min via INTRAVENOUS

## 2016-11-05 MED ORDER — ASPIRIN EC 325 MG PO TBEC: 325.0000 mg | DELAYED_RELEASE_TABLET | Freq: Every day | ORAL | 0 refills | Status: DC

## 2016-11-05 MED ORDER — ONDANSETRON HCL 4 MG PO TABS: 4.0000 mg | ORAL_TABLET | Freq: Three times a day (TID) | ORAL | 0 refills | Status: DC | PRN

## 2016-11-05 MED ORDER — METOCLOPRAMIDE HCL 5 MG/ML IJ SOLN
INTRAMUSCULAR | Status: AC
  Filled 2016-11-05: qty 2

## 2016-11-05 MED ORDER — DEXAMETHASONE SODIUM PHOSPHATE 10 MG/ML IJ SOLN
10.0000 mg | Freq: Once | INTRAMUSCULAR | Status: AC
  Administered 2016-11-06: 10 mg via INTRAVENOUS
  Filled 2016-11-05: qty 1

## 2016-11-05 MED ORDER — OXYCODONE-ACETAMINOPHEN 5-325 MG PO TABS: 1.0000 | ORAL_TABLET | ORAL | 0 refills | Status: DC | PRN

## 2016-11-05 MED ORDER — SODIUM CHLORIDE 0.9% FLUSH
INTRAVENOUS | Status: DC | PRN
  Administered 2016-11-05: 40 mL

## 2016-11-05 MED ORDER — PANTOPRAZOLE SODIUM 40 MG PO TBEC
40.0000 mg | DELAYED_RELEASE_TABLET | Freq: Every day | ORAL | Status: DC
  Administered 2016-11-06 – 2016-11-07 (×2): 40 mg via ORAL
  Filled 2016-11-05 (×2): qty 1

## 2016-11-05 MED ORDER — METOCLOPRAMIDE HCL 5 MG PO TABS: 5.0000 mg | ORAL_TABLET | Freq: Three times a day (TID) | ORAL | Status: DC | PRN

## 2016-11-05 MED ORDER — BUPIVACAINE IN DEXTROSE 0.75-8.25 % IT SOLN
INTRATHECAL | Status: DC | PRN
  Administered 2016-11-05: 12 mg via INTRATHECAL

## 2016-11-05 MED ORDER — DEXAMETHASONE SODIUM PHOSPHATE 10 MG/ML IJ SOLN
INTRAMUSCULAR | Status: DC | PRN
  Administered 2016-11-05: 10 mg via INTRAVENOUS

## 2016-11-05 MED ORDER — DOCUSATE SODIUM 100 MG PO CAPS
100.0000 mg | ORAL_CAPSULE | Freq: Two times a day (BID) | ORAL | Status: DC
  Administered 2016-11-05 – 2016-11-07 (×5): 100 mg via ORAL
  Filled 2016-11-05 (×5): qty 1

## 2016-11-05 MED ORDER — CELECOXIB 200 MG PO CAPS
200.0000 mg | ORAL_CAPSULE | Freq: Two times a day (BID) | ORAL | Status: DC
  Administered 2016-11-05 – 2016-11-07 (×5): 200 mg via ORAL
  Filled 2016-11-05 (×5): qty 1

## 2016-11-05 MED ORDER — MEPERIDINE HCL 25 MG/ML IJ SOLN: 6.2500 mg | INTRAMUSCULAR | Status: DC | PRN

## 2016-11-05 MED ORDER — METHOCARBAMOL 1000 MG/10ML IJ SOLN
500.0000 mg | Freq: Four times a day (QID) | INTRAVENOUS | Status: DC | PRN
  Filled 2016-11-05: qty 5

## 2016-11-05 MED ORDER — METOCLOPRAMIDE HCL 5 MG/ML IJ SOLN
INTRAMUSCULAR | Status: DC | PRN
  Administered 2016-11-05: 10 mg via INTRAVENOUS

## 2016-11-05 MED ORDER — MIDAZOLAM HCL 5 MG/5ML IJ SOLN
INTRAMUSCULAR | Status: DC | PRN
  Administered 2016-11-05: 2 mg via INTRAVENOUS

## 2016-11-05 MED ORDER — ONDANSETRON HCL 4 MG/2ML IJ SOLN
INTRAMUSCULAR | Status: DC | PRN
  Administered 2016-11-05: 4 mg via INTRAVENOUS

## 2016-11-05 MED ORDER — LACTATED RINGERS IV SOLN
INTRAVENOUS | Status: DC
  Administered 2016-11-05: 07:00:00 via INTRAVENOUS

## 2016-11-05 MED ORDER — MIDAZOLAM HCL 2 MG/2ML IJ SOLN
INTRAMUSCULAR | Status: AC
  Filled 2016-11-05: qty 2

## 2016-11-05 MED ORDER — CEFAZOLIN SODIUM-DEXTROSE 2-4 GM/100ML-% IV SOLN
2.0000 g | INTRAVENOUS | Status: AC
  Administered 2016-11-05: 2 g via INTRAVENOUS

## 2016-11-05 MED ORDER — MENTHOL 3 MG MT LOZG: 1.0000 | LOZENGE | OROMUCOSAL | Status: DC | PRN

## 2016-11-05 MED ORDER — TRAZODONE HCL 50 MG PO TABS: 150.0000 mg | ORAL_TABLET | Freq: Every evening | ORAL | Status: DC | PRN

## 2016-11-05 MED ORDER — ALBUMIN HUMAN 5 % IV SOLN
INTRAVENOUS | Status: DC | PRN
  Administered 2016-11-05: 09:00:00 via INTRAVENOUS

## 2016-11-05 MED ORDER — METOCLOPRAMIDE HCL 5 MG/ML IJ SOLN: 5.0000 mg | Freq: Three times a day (TID) | INTRAMUSCULAR | Status: DC | PRN

## 2016-11-05 MED ORDER — ASPIRIN EC 325 MG PO TBEC
325.0000 mg | DELAYED_RELEASE_TABLET | Freq: Every day | ORAL | Status: DC
  Administered 2016-11-06 – 2016-11-07 (×2): 325 mg via ORAL
  Filled 2016-11-05 (×2): qty 1

## 2016-11-05 MED ORDER — FENTANYL CITRATE (PF) 100 MCG/2ML IJ SOLN
INTRAMUSCULAR | Status: DC | PRN
  Administered 2016-11-05: 50 ug via INTRAVENOUS

## 2016-11-05 MED ORDER — PROPOFOL 10 MG/ML IV BOLUS
INTRAVENOUS | Status: AC
  Filled 2016-11-05: qty 20

## 2016-11-05 MED ORDER — 0.9 % SODIUM CHLORIDE (POUR BTL) OPTIME
TOPICAL | Status: DC | PRN
  Administered 2016-11-05: 1000 mL

## 2016-11-05 MED ORDER — DEXAMETHASONE SODIUM PHOSPHATE 4 MG/ML IJ SOLN: 8.0000 mg | Freq: Once | INTRAMUSCULAR | Status: DC | PRN

## 2016-11-05 MED ORDER — BUPIVACAINE LIPOSOME 1.3 % IJ SUSP
20.0000 mL | INTRAMUSCULAR | Status: AC
  Administered 2016-11-05: 20 mL
  Filled 2016-11-05: qty 20

## 2016-11-05 SURGICAL SUPPLY — 67 items
BLADE CLIPPER SURG (BLADE) IMPLANT
BLADE SAW SGTL 18X1.27X75 (BLADE) ×2 IMPLANT
BLADE SURG 10 STRL SS (BLADE) ×1 IMPLANT
CAPT HIP TOTAL 2 ×1 IMPLANT
CELLS DAT CNTRL 66122 CELL SVR (MISCELLANEOUS) ×1 IMPLANT
CLSR STERI-STRIP ANTIMIC 1/2X4 (GAUZE/BANDAGES/DRESSINGS) ×4 IMPLANT
COVER SURGICAL LIGHT HANDLE (MISCELLANEOUS) ×2 IMPLANT
DRAPE C-ARM 42X72 X-RAY (DRAPES) ×2 IMPLANT
DRAPE HALF SHEET 40X57 (DRAPES) ×2 IMPLANT
DRAPE IMP U-DRAPE 54X76 (DRAPES) ×6 IMPLANT
DRAPE INCISE IOBAN 66X45 STRL (DRAPES) ×2 IMPLANT
DRAPE ORTHO SPLIT 77X108 STRL (DRAPES) ×4
DRAPE STERI IOBAN 125X83 (DRAPES) ×2 IMPLANT
DRAPE SURG 17X23 STRL (DRAPES) ×2 IMPLANT
DRAPE SURG ORHT 6 SPLT 77X108 (DRAPES) ×2 IMPLANT
DRAPE U-SHAPE 47X51 STRL (DRAPES) ×2 IMPLANT
DRSG AQUACEL AG ADV 3.5X10 (GAUZE/BANDAGES/DRESSINGS) ×2 IMPLANT
DURAPREP 26ML APPLICATOR (WOUND CARE) ×2 IMPLANT
ELECT BLADE 4.0 EZ CLEAN MEGAD (MISCELLANEOUS) ×2
ELECT CAUTERY BLADE 6.4 (BLADE) ×2 IMPLANT
ELECT REM PT RETURN 9FT ADLT (ELECTROSURGICAL) ×2
ELECTRODE BLDE 4.0 EZ CLN MEGD (MISCELLANEOUS) IMPLANT
ELECTRODE REM PT RTRN 9FT ADLT (ELECTROSURGICAL) ×1 IMPLANT
FACESHIELD WRAPAROUND (MASK) ×4 IMPLANT
FACESHIELD WRAPAROUND OR TEAM (MASK) ×2 IMPLANT
GLOVE BIOGEL PI IND STRL 6 (GLOVE) IMPLANT
GLOVE BIOGEL PI IND STRL 7.0 (GLOVE) IMPLANT
GLOVE BIOGEL PI INDICATOR 6 (GLOVE) ×1
GLOVE BIOGEL PI INDICATOR 7.0 (GLOVE) ×1
GLOVE ECLIPSE 7.0 STRL STRAW (GLOVE) ×1 IMPLANT
GLOVE ORTHO TXT STRL SZ7.5 (GLOVE) ×4 IMPLANT
GOWN STRL REUS W/ TWL LRG LVL3 (GOWN DISPOSABLE) ×3 IMPLANT
GOWN STRL REUS W/ TWL XL LVL3 (GOWN DISPOSABLE) ×1 IMPLANT
GOWN STRL REUS W/TWL LRG LVL3 (GOWN DISPOSABLE) ×6
GOWN STRL REUS W/TWL XL LVL3 (GOWN DISPOSABLE) ×2
KIT BASIN OR (CUSTOM PROCEDURE TRAY) ×2 IMPLANT
KIT ROOM TURNOVER OR (KITS) ×2 IMPLANT
MANIFOLD NEPTUNE II (INSTRUMENTS) ×2 IMPLANT
NDL HYPO 25GX1X1/2 BEV (NEEDLE) ×1 IMPLANT
NDL SAFETY ECLIPSE 18X1.5 (NEEDLE) ×1 IMPLANT
NEEDLE HYPO 18GX1.5 SHARP (NEEDLE) ×2
NEEDLE HYPO 25GX1X1/2 BEV (NEEDLE) ×2 IMPLANT
NS IRRIG 1000ML POUR BTL (IV SOLUTION) ×2 IMPLANT
PACK TOTAL JOINT (CUSTOM PROCEDURE TRAY) ×2 IMPLANT
PAD ARMBOARD 7.5X6 YLW CONV (MISCELLANEOUS) ×4 IMPLANT
RETRACTOR WND ALEXIS 18 MED (MISCELLANEOUS) ×1 IMPLANT
RTRCTR WOUND ALEXIS 18CM MED (MISCELLANEOUS) ×2
STRIP CLOSURE SKIN 1/2X4 (GAUZE/BANDAGES/DRESSINGS) ×1 IMPLANT
SUCTION FRAZIER HANDLE 10FR (MISCELLANEOUS) ×1
SUCTION TUBE FRAZIER 10FR DISP (MISCELLANEOUS) ×1 IMPLANT
SUT ETHIBOND NAB CT1 #1 30IN (SUTURE) IMPLANT
SUT MNCRL AB 4-0 PS2 18 (SUTURE) ×2 IMPLANT
SUT VIC AB 0 CT1 27 (SUTURE) ×2
SUT VIC AB 0 CT1 27XBRD ANBCTR (SUTURE) ×1 IMPLANT
SUT VIC AB 1 CT1 27 (SUTURE)
SUT VIC AB 1 CT1 27XBRD ANBCTR (SUTURE) IMPLANT
SUT VIC AB 1 CTX 36 (SUTURE) ×2
SUT VIC AB 1 CTX36XBRD ANBCTR (SUTURE) IMPLANT
SUT VIC AB 2-0 CT1 27 (SUTURE) ×6
SUT VIC AB 2-0 CT1 TAPERPNT 27 (SUTURE) ×2 IMPLANT
SYR 50ML LL SCALE MARK (SYRINGE) ×2 IMPLANT
SYR CONTROL 10ML LL (SYRINGE) ×2 IMPLANT
TOWEL OR 17X24 6PK STRL BLUE (TOWEL DISPOSABLE) ×2 IMPLANT
TOWEL OR 17X26 10 PK STRL BLUE (TOWEL DISPOSABLE) ×2 IMPLANT
TRAY CATH 16FR W/PLASTIC CATH (SET/KITS/TRAYS/PACK) ×1 IMPLANT
TRAY FOLEY CATH SILVER 14FR (SET/KITS/TRAYS/PACK) IMPLANT
WATER STERILE IRR 1000ML POUR (IV SOLUTION) ×2 IMPLANT

## 2016-11-05 NOTE — Transfer of Care (Signed)
Immediate Anesthesia Transfer of Care Note  Patient: [name] [name]  Procedure(s) Performed: Procedure(s): LEFT TOTAL HIP ARTHROPLASTY ANTERIOR APPROACH (Left)  Patient Location: PACU  Anesthesia Type:Spinal  Level of Consciousness: awake, alert  and oriented  Airway & Oxygen Therapy: Patient Spontanous Breathing and Patient connected to face mask oxygen  Post-op Assessment: Report given to RN and Post -op Vital signs reviewed and stable  Post vital signs: Reviewed and stable  Last Vitals:  Vitals:   11/05/16 0637  BP: (!) 113/54  Pulse: 78  Resp: 20  Temp: 36.6 C    Last Pain:  Vitals:   11/05/16 0637  TempSrc: Oral         Complications: No apparent anesthesia complications

## 2016-11-05 NOTE — Discharge Summary (Addendum)
Patient ID: [name] [name] MRN: [id number] DOB/AGE: 06-10-1955 61 y.o.  Admit date: 11/05/2016 Discharge date: 11/07/2016  Admission Diagnoses:  Principal Problem:   Primary localized osteoarthritis of left hip Active Problems:   Obesity   Discharge Diagnoses:  Same  Past Medical History:  Diagnosis Date  . Anxiety   . DDD (degenerative disc disease), lumbar    BACK, FINGER, WRIST  . DDD (degenerative disc disease), lumbar   . Depression   . Diverticulitis   . Dysrhythmia   . GERD (gastroesophageal reflux disease)   . Headache   . Hypothyroidism   . Irregular heart rate   . Pneumonia    hx  . Sleep apnea    unable to wear mask-sleep study 3-4 yrs ago  . Spine malformation    Kiari Malformation  . Thyroid disease   . Wears dentures    UPPER    Surgeries: Procedure(s): LEFT TOTAL HIP ARTHROPLASTY ANTERIOR APPROACH on 11/05/2016   Consultants:   Discharged Condition: Improved  Hospital Course: [name] [name] is an 61 y.o. female who was admitted 11/05/2016 for operative treatment ofPrimary localized osteoarthritis of left hip. Patient has severe unremitting pain that affects sleep, daily activities, and work/hobbies. After pre-op clearance the patient was taken to the operating room on 11/05/2016 and underwent  Procedure(s): LEFT TOTAL HIP ARTHROPLASTY ANTERIOR APPROACH.    Patient was given perioperative antibiotics:  Anti-infectives    Start     Dose/Rate Route Frequency Ordered Stop   11/05/16 1200  ceFAZolin (ANCEF) IVPB 2g/100 mL premix     2 g 200 mL/hr over 30 Minutes Intravenous Every 6 hours 11/05/16 1157 11/05/16 2000   11/05/16 0632  ceFAZolin (ANCEF) IVPB 2g/100 mL premix     2 g 200 mL/hr over 30 Minutes Intravenous On call to O.R. 11/05/16 8786 11/05/16 0740       Patient was given sequential compression devices, early ambulation, and chemoprophylaxis to prevent DVT.  Patient benefited maximally from hospital stay and there were no complications.     Recent vital signs:  Patient Vitals for the past 24 hrs:  BP Temp Temp src Pulse Resp SpO2 Height Weight  11/07/16 0622 (!) 105/48 97.8 F (36.6 C) Oral 79 18 100 % - -  11/07/16 0009 (!) 100/55 98 F (36.7 C) Oral 76 18 93 % - -  11/06/16 2034 (!) 133/53 98.5 F (36.9 C) Oral 79 18 97 % - -  11/06/16 1640 (!) 110/58 97.9 F (36.6 C) Oral 75 - 96 % - -  11/06/16 1158 (!) 105/58 97.9 F (36.6 C) Oral 79 - 93 % 5\' 5"  (1.651 m) 98.6 kg (217 lb 6 oz)  11/06/16 0954 (!) 90/58 98.2 F (36.8 C) Oral 79 - 94 % - -     Recent laboratory studies:   Recent Labs  11/06/16 0646 11/07/16 0656  WBC 13.6* 12.5*  HGB 10.1* 10.3*  HCT 31.9* 33.5*  PLT 245 259  NA 136 137  K 4.8 4.3  CL 107 107  CO2 24 24  BUN 17 21*  CREATININE 1.30* 1.20*  GLUCOSE 123* 125*  CALCIUM 8.7* 8.5*     Discharge Medications:   Allergies as of 11/07/2016      Reactions   Morphine    REACTION: Bradycardia, Hypotension   Diphenhydramine Hcl Palpitations      Medication List    STOP taking these medications   HYDROcodone-acetaminophen 5-325 MG tablet Commonly known as:  NORCO   ondansetron  4 MG disintegrating tablet Commonly known as:  ZOFRAN ODT     TAKE these medications   aspirin EC 325 MG tablet Take 1 tablet (325 mg total) by mouth daily. 1 tab a day for the next 30 days to prevent blood clots What changed:  medication strength  how much to take  additional instructions   CALCIUM-VITAMIN D PO Take 1 tablet by mouth daily.   DULoxetine 60 MG capsule Commonly known as:  CYMBALTA Take 120 mg by mouth daily.   fenofibrate 160 MG tablet Take 160 mg by mouth daily.   levothyroxine 125 MCG tablet Commonly known as:  SYNTHROID, LEVOTHROID Take 125 mcg by mouth daily before breakfast.   methocarbamol 500 MG tablet Commonly known as:  ROBAXIN Take 1 tablet (500 mg total) by mouth 4 (four) times daily.   multivitamin with minerals Tabs tablet Take 1 tablet by mouth daily.    omeprazole 20 MG capsule Commonly known as:  PRILOSEC Take 20 mg by mouth daily.   ondansetron 4 MG tablet Commonly known as:  ZOFRAN Take 1 tablet (4 mg total) by mouth every 8 (eight) hours as needed for nausea or vomiting.   oxyCODONE-acetaminophen 5-325 MG tablet Commonly known as:  ROXICET Take 1-2 tablets by mouth every 4 (four) hours as needed.   traZODone 150 MG tablet Commonly known as:  DESYREL Take 150 mg by mouth at bedtime as needed for sleep.       Diagnostic Studies: Dg C-arm 1-60 Min  Result Date: 11/05/2016 CLINICAL DATA:  Total hip replacement. EXAM: DG C-ARM 61-120 MIN; OPERATIVE LEFT HIP WITH PELVIS COMPARISON:  09/01/2016 . FINDINGS: Total left hip replacement. Anatomic alignment. No acute bony abnormality. No evidence of fracture. IMPRESSION: Total left hip replacement. Anatomic alignment. No acute abnormality. Electronically Signed   By: Marcello Moores  Register   On: 11/05/2016 10:01   Dg Hip Operative Unilat W Or W/o Pelvis Left  Result Date: 11/05/2016 CLINICAL DATA:  Total hip replacement. EXAM: DG C-ARM 61-120 MIN; OPERATIVE LEFT HIP WITH PELVIS COMPARISON:  09/01/2016 . FINDINGS: Total left hip replacement. Anatomic alignment. No acute bony abnormality. No evidence of fracture. IMPRESSION: Total left hip replacement. Anatomic alignment. No acute abnormality. Electronically Signed   By: Marcello Moores  Register   On: 11/05/2016 10:01    Disposition: 01-Home or Self Care    Follow-up Information    Ninetta Lights, MD. Schedule an appointment as soon as possible for a visit in 2 weeks.   Specialty:  Orthopedic Surgery Contact information: [address] [place] [address] [phone]            Signed: Fannie Knee 11/07/2016, 8:10 AM

## 2016-11-05 NOTE — Discharge Instructions (Signed)

## 2016-11-05 NOTE — Anesthesia Procedure Notes (Signed)
Spinal  Patient location during procedure: OR Start time: 11/05/2016 7:35 AM End time: 11/05/2016 7:46 AM Staffing Anesthesiologist: [address] Performed: anesthesiologist  Preanesthetic Checklist Completed: patient identified, site marked, surgical consent, pre-op evaluation, timeout performed, IV checked, risks and benefits discussed and monitors and equipment checked Spinal Block Patient position: sitting Prep: DuraPrep Patient monitoring: heart rate, cardiac monitor, continuous pulse ox and blood pressure Approach: midline Location: L2-3 Injection technique: single-shot Needle Needle type: Sprotte  Needle gauge: 24 G Needle length: 9 cm Assessment Sensory level: T8

## 2016-11-05 NOTE — Anesthesia Procedure Notes (Signed)
Procedure Name: MAC Date/Time: 11/05/2016 7:40 AM Performed by: Kyung Rudd Pre-anesthesia Checklist: Patient identified, Emergency Drugs available, Suction available and Patient being monitored Patient Re-evaluated:Patient Re-evaluated prior to inductionOxygen Delivery Method: Simple face mask Intubation Type: IV induction Placement Confirmation: positive ETCO2

## 2016-11-05 NOTE — Anesthesia Postprocedure Evaluation (Signed)
Anesthesia Post Note  Patient: [name] [name]  Procedure(s) Performed: Procedure(s) (LRB): LEFT TOTAL HIP ARTHROPLASTY ANTERIOR APPROACH (Left)     Patient location during evaluation: PACU Anesthesia Type: Spinal Level of consciousness: oriented and awake and alert Pain management: pain level controlled Vital Signs Assessment: post-procedure vital signs reviewed and stable Respiratory status: spontaneous breathing, respiratory function stable and patient connected to nasal cannula oxygen Cardiovascular status: blood pressure returned to baseline and stable Postop Assessment: no headache and no backache Anesthetic complications: no    Last Vitals:  Vitals:   11/05/16 1054 11/05/16 1100  BP: (!) 89/57   Pulse: (!) 58 (!) 56  Resp: 15 17  Temp:      Last Pain:  Vitals:   11/05/16 0637  TempSrc: Oral            L Sensory Level: L3-Anterior knee, lower leg (11/05/16 1100) R Sensory Level: L3-Anterior knee, lower leg (11/05/16 1100)  Leightyn Cina

## 2016-11-05 NOTE — Interval H&P Note (Signed)
History and Physical Interval Note:  11/05/2016 7:23 AM  [name] [name]  has presented today for surgery, with the diagnosis of OA LEFT HIP  The various methods of treatment have been discussed with the patient and family. After consideration of risks, benefits and other options for treatment, the patient has consented to  Procedure(s): LEFT TOTAL HIP ARTHROPLASTY ANTERIOR APPROACH (Left) as a surgical intervention .  The patient's history has been reviewed, patient examined, no change in status, stable for surgery.  I have reviewed the patient's chart and labs.  Questions were answered to the patient's satisfaction.     Ninetta Lights

## 2016-11-05 NOTE — Evaluation (Signed)
Physical Therapy Evaluation Patient Details Name: [name] [name] MRN: [id number] DOB: 1955-06-30 Today's Date: 11/05/2016   History of Present Illness  Pt is a 61 y/o female s/p L THA, anterior hip precautions. PMH includes plantar fasciitis, obesity, dysrhythmia, kiari malformation, s/p back surgery, and R THA.   Clinical Impression  Pt is s/p surgery above with deficits below. PTA, pt was independent with functional mobility. Upon evaluation, pt extremely limited by pain. Pt only able to perform limited LLE exercise and limited to stand pivot transfer secondary to pain. Able to take a few steps to chair requiring min guard assist for safety. Pt currently not safe to go home, however, anticipate she will progress well once pain controlled. Follow up recommendations as arranged by MD. Will continue to follow acutely to maximize functional mobility independence.     Follow Up Recommendations DC plan and follow up therapy as arranged by surgeon;Supervision/Assistance - 24 hour    Equipment Recommendations  None recommended by PT    Recommendations for Other Services       Precautions / Restrictions Precautions Precautions: Anterior Hip Precaution Booklet Issued: Yes (comment) Precaution Comments: Reviewed hip precautions and THA exercise handout. Very limited tolerance secondary to pain.  Restrictions Weight Bearing Restrictions: Yes LLE Weight Bearing: Weight bearing as tolerated      Mobility  Bed Mobility Overal bed mobility: Needs Assistance Bed Mobility: Supine to Sit     Supine to sit: Min assist     General bed mobility comments: Min A for LLE management secondary to pain. Educated about sequencing in order to maintain precautions during bed mobility. Heavy use of bed rails.   Transfers Overall transfer level: Needs assistance Equipment used: Rolling walker (2 wheeled) Transfers: Stand Pivot Transfers;Sit to/from Stand Sit to Stand: Min assist Stand pivot transfers:  Min guard       General transfer comment: Min A for lift assist and steadying upon standing. Increased pain so mobility limited to stand pivot. Able to take a few steps to chair with min guard A for safety. Verbal cues for sequencing with RW.   Ambulation/Gait             General Gait Details: Able to take a few steps to perform stand pivot with min guard, however, further mobility limited secondary to pain.   Stairs            Wheelchair Mobility    Modified Rankin (Stroke Patients Only)       Balance Overall balance assessment: Needs assistance Sitting-balance support: No upper extremity supported;Feet supported Sitting balance-Leahy Scale: Fair     Standing balance support: Bilateral upper extremity supported;During functional activity Standing balance-Leahy Scale: Poor Standing balance comment: Reliant on UE support for balance                              Pertinent Vitals/Pain Pain Assessment: 0-10 Pain Score: 10-Worst pain ever Pain Location: L hip  Pain Descriptors / Indicators: Aching;Operative site guarding;Grimacing Pain Intervention(s): Limited activity within patient's tolerance;Monitored during session;Repositioned    Home Living Family/patient expects to be discharged to:: Private residence Living Arrangements: Spouse/significant other Available Help at Discharge: Family;Available 24 hours/day Type of Home: House Home Access: Ramped entrance     Home Layout: One level Home Equipment: Walker - 2 wheels;Toilet riser      Prior Function Level of Independence: Independent  Hand Dominance   Dominant Hand: Right    Extremity/Trunk Assessment   Upper Extremity Assessment Upper Extremity Assessment: Generalized weakness    Lower Extremity Assessment Lower Extremity Assessment: LLE deficits/detail LLE Deficits / Details: Numbness from the knee up. Deficits consistent with post op pain and weakness. Limited  tolerance for exercise and mobility secondary to pain     Cervical / Trunk Assessment Cervical / Trunk Assessment: Normal  Communication   Communication: No difficulties  Cognition Arousal/Alertness: Awake/alert Behavior During Therapy: WFL for tasks assessed/performed Overall Cognitive Status: Within Functional Limits for tasks assessed                                        General Comments General comments (skin integrity, edema, etc.): Pt with increased pain with movement, therefore limited tolerance for activity.     Exercises Total Joint Exercises Ankle Circles/Pumps: AROM;Left;10 reps;Supine Quad Sets: AROM;Left;10 reps;Supine   Assessment/Plan    PT Assessment Patient needs continued PT services  PT Problem List Decreased strength;Decreased range of motion;Decreased activity tolerance;Decreased balance;Decreased mobility;Decreased knowledge of use of DME;Decreased knowledge of precautions;Pain       PT Treatment Interventions DME instruction;Gait training;Functional mobility training;Therapeutic activities;Therapeutic exercise;Balance training;Neuromuscular re-education;Patient/family education    PT Goals (Current goals can be found in the Care Plan section)  Acute Rehab PT Goals Patient Stated Goal: to decrease pain  PT Goal Formulation: With patient Time For Goal Achievement: 11/12/16 Potential to Achieve Goals: Good    Frequency 7X/week   Barriers to discharge        Co-evaluation               AM-PAC PT "6 Clicks" Daily Activity  Outcome Measure Difficulty turning over in bed (including adjusting bedclothes, sheets and blankets)?: Total Difficulty moving from lying on back to sitting on the side of the bed? : Total Difficulty sitting down on and standing up from a chair with arms (e.g., wheelchair, bedside commode, etc,.)?: Total Help needed moving to and from a bed to chair (including a wheelchair)?: A Little Help needed walking  in hospital room?: A Little Help needed climbing 3-5 steps with a railing? : A Lot 6 Click Score: 11    End of Session Equipment Utilized During Treatment: Gait belt Activity Tolerance: Patient limited by pain Patient left: in chair;with call bell/phone within reach Nurse Communication: Mobility status;Patient requests pain meds PT Visit Diagnosis: Other abnormalities of gait and mobility (R26.89);Pain Pain - Right/Left: Left Pain - part of body: Hip    Time: 9798-9211 PT Time Calculation (min) (ACUTE ONLY): 25 min   Charges:   PT Evaluation $PT Eval Moderate Complexity: 1 Procedure PT Treatments $Therapeutic Activity: 8-22 mins   PT G Codes:        Leighton Ruff, PT, DPT  Acute Rehabilitation Services  Pager: [phone]   Rudean Equihua 11/05/2016, 4:47 PM

## 2016-11-06 ENCOUNTER — Encounter (HOSPITAL_COMMUNITY): Payer: Self-pay | Admitting: Orthopedic Surgery

## 2016-11-06 LAB — BASIC METABOLIC PANEL
ANION GAP: 5 (ref 5–15)
BUN: 17 mg/dL (ref 6–20)
CALCIUM: 8.7 mg/dL — AB (ref 8.9–10.3)
CO2: 24 mmol/L (ref 22–32)
CREATININE: 1.3 mg/dL — AB (ref 0.44–1.00)
Chloride: 107 mmol/L (ref 101–111)
GFR, EST AFRICAN AMERICAN: 51 mL/min — AB (ref >60)
GFR, EST NON AFRICAN AMERICAN: 44 mL/min — AB (ref >60)
Glucose, Bld: 123 mg/dL — ABNORMAL HIGH (ref 65–99)
Potassium: 4.8 mmol/L (ref 3.5–5.1)
SODIUM: 136 mmol/L (ref 135–145)

## 2016-11-06 LAB — CBC
HCT: 31.9 % — ABNORMAL LOW (ref 36.0–46.0)
Hemoglobin: 10.1 g/dL — ABNORMAL LOW (ref 12.0–15.0)
MCH: 27.9 pg (ref 26.0–34.0)
MCHC: 31.7 g/dL (ref 30.0–36.0)
MCV: 88.1 fL (ref 78.0–100.0)
PLATELETS: 245 10*3/uL (ref 150–400)
RBC: 3.62 MIL/uL — ABNORMAL LOW (ref 3.87–5.11)
RDW: 13.1 % (ref 11.5–15.5)
WBC: 13.6 10*3/uL — AB (ref 4.0–10.5)

## 2016-11-06 NOTE — Progress Notes (Addendum)
Physical Therapy Treatment Patient Details Name: [name] [name] MRN: [id number] DOB: [birth year] Today's Date: 11/06/2016    History of Present Illness Pt is a 61 y/o female s/p L THA, anterior hip precautions. PMH includes plantar fasciitis, obesity, dysrhythmia, kiari malformation, s/p back surgery, and R THA.     PT Comments    Pt performed increased gait and reviewed supine and seated exercises.  Pt required review of anterior hip precautions and cueing throughout to maintain these precautions.  Plan to return home remains appropriate and will follow up in pm to continue to progress mobility in prep for d/c home.    Follow Up Recommendations  DC plan and follow up therapy as arranged by surgeon;Supervision/Assistance - 24 hour     Equipment Recommendations  None recommended by PT    Recommendations for Other Services       Precautions / Restrictions Precautions Precautions: Anterior Hip Precaution Booklet Issued: Yes (comment) Precaution Comments: Pt able to recall 2/3 anterior hip precautions.  Required teaching throughout session for safety.   Restrictions Weight Bearing Restrictions: No LLE Weight Bearing: Weight bearing as tolerated    Mobility  Bed Mobility Overal bed mobility: Needs Assistance Bed Mobility: Supine to Sit     Supine to sit: Min assist;HOB elevated     General bed mobility comments: Min A for LLE management secondary to pain. Educated about sequencing in order to maintain precautions during bed mobility. Heavy use of bed rails.   Transfers Overall transfer level: Needs assistance Equipment used: Rolling walker (2 wheeled) Transfers: Sit to/from Stand Sit to Stand: Min guard         General transfer comment: Cues for hand placement to and from seated surface.  Cues for foot placement and forward weight shifting to ascend into standing.    Ambulation/Gait Ambulation/Gait assistance: Min guard Ambulation Distance (Feet): 70 Feet Assistive  device: Rolling walker (2 wheeled) Gait Pattern/deviations: Step-to pattern;Trunk flexed;Decreased stride length;Decreased stance time - left;Decreased step length - right   Gait velocity interpretation: Below normal speed for age/gender General Gait Details: Cues for upper trunk control, relaxing B shoulder, L heel strike and toe off, and RW positioning.  Pt fatigues quickly and reports feeling slightly dizzy.     Stairs            Wheelchair Mobility    Modified Rankin (Stroke Patients Only)       Balance Overall balance assessment: Needs assistance Sitting-balance support: No upper extremity supported;Feet supported Sitting balance-Leahy Scale: Fair     Standing balance support: Bilateral upper extremity supported;During functional activity Standing balance-Leahy Scale: Poor Standing balance comment: Remains reliant on UE support for balance.                              Cognition Arousal/Alertness: Awake/alert Behavior During Therapy: WFL for tasks assessed/performed Overall Cognitive Status: Within Functional Limits for tasks assessed                                        Exercises Total Joint Exercises Ankle Circles/Pumps: AROM;Both;10 reps;Supine Quad Sets: AROM;Left;10 reps;Supine Short Arc Quad: AROM;Left;10 reps;Supine Heel Slides: AAROM;Left;10 reps;Supine Long Arc Quad: AROM;Left;10 reps;Seated    General Comments        Pertinent Vitals/Pain Pain Assessment: 0-10 Pain Score: 8  Pain Location: L hip  Pain Descriptors /  Indicators: Aching;Operative site guarding;Grimacing Pain Intervention(s): Monitored during session;Repositioned;Ice applied    Home Living                      Prior Function            PT Goals (current goals can now be found in the care plan section) Acute Rehab PT Goals Patient Stated Goal: to decrease pain  Potential to Achieve Goals: Good Progress towards PT goals: Progressing  toward goals    Frequency    7X/week      PT Plan Current plan remains appropriate    Co-evaluation              AM-PAC PT "6 Clicks" Daily Activity  Outcome Measure  Difficulty turning over in bed (including adjusting bedclothes, sheets and blankets)?: Total Difficulty moving from lying on back to sitting on the side of the bed? : Total Difficulty sitting down on and standing up from a chair with arms (e.g., wheelchair, bedside commode, etc,.)?: Total Help needed moving to and from a bed to chair (including a wheelchair)?: A Little Help needed walking in hospital room?: A Little Help needed climbing 3-5 steps with a railing? : A Little 6 Click Score: 12    End of Session Equipment Utilized During Treatment: Gait belt Activity Tolerance: Patient limited by pain Patient left: in chair;with call bell/phone within reach Nurse Communication: Mobility status PT Visit Diagnosis: Other abnormalities of gait and mobility (R26.89);Pain Pain - Right/Left: Left Pain - part of body: Hip     Time: 4917-9150 PT Time Calculation (min) (ACUTE ONLY): 28 min  Charges:  $Gait Training: 8-22 mins $Therapeutic Exercise: 8-22 mins                    G Codes:       Governor Rooks, PTA pager [phone]    Cristela Blue 11/06/2016, 3:51 PM

## 2016-11-06 NOTE — Progress Notes (Signed)
Subjective: 1 Day Post-Op Procedure(s) (LRB): LEFT TOTAL HIP ARTHROPLASTY ANTERIOR APPROACH (Left) Patient reports pain as moderate.  No nausea/vomiting, lightheadedness/dizziness, chest pain/sob.    Objective: Vital signs in last 24 hours: Temp:  [97.6 F (36.4 C)-98.7 F (37.1 C)] 98.3 F (36.8 C) (06/28 0356) Pulse Rate:  [56-80] 78 (06/28 0356) Resp:  [13-18] 18 (06/28 0356) BP: (89-118)/(45-72) 91/52 (06/28 0356) SpO2:  [95 %-100 %] 95 % (06/28 0356)  Intake/Output from previous day: 06/27 0701 - 06/28 0700 In: 2150 [P.O.:480; I.V.:1420; IV Piggyback:250] Out: 1800 [Urine:1400; Blood:400] Intake/Output this shift: No intake/output data recorded.  No results for input(s): HGB in the last 72 hours. No results for input(s): WBC, RBC, HCT, PLT in the last 72 hours. No results for input(s): NA, K, CL, CO2, BUN, CREATININE, GLUCOSE, CALCIUM in the last 72 hours. No results for input(s): LABPT, INR in the last 72 hours.  Neurologically intact Neurovascular intact Sensation intact distally Intact pulses distally Dorsiflexion/Plantar flexion intact Incision: scant drainage No cellulitis present Compartment soft  Assessment/Plan: 1 Day Post-Op Procedure(s) (LRB): LEFT TOTAL HIP ARTHROPLASTY ANTERIOR APPROACH (Left) Advance diet Up with therapy D/C IV fluids Plan for discharge tomorrow home with hhpt unless she mobilizes well today with PT (ok to leave today) WBAT LLE-anterior hip precautions Dry dressing change prn  Fannie Knee 11/06/2016, 7:18 AM

## 2016-11-06 NOTE — Progress Notes (Signed)
Physical Therapy Treatment Patient Details Name: [name] [name] MRN: [id number] DOB: 1956/04/14 Today's Date: 11/06/2016    History of Present Illness Pt is a 61 y/o female s/p L THA, anterior hip precautions. PMH includes plantar fasciitis, obesity, dysrhythmia, kiari malformation, s/p back surgery, and R THA.     PT Comments    Pt performed increased gait training this afternoon but remains to fatigue with activity.  Pt reports having a ramp at home and will not need stair training for safe entry into home.  Will continue PT per POC pending d/c home.     Follow Up Recommendations  DC plan and follow up therapy as arranged by surgeon;Supervision/Assistance - 24 hour     Equipment Recommendations  None recommended by PT    Recommendations for Other Services       Precautions / Restrictions Precautions Precautions: Anterior Hip Precaution Booklet Issued: Yes (comment) Precaution Comments: Pt able to recall 3/3 with intermittent cueing for mobility during session.   Restrictions Weight Bearing Restrictions: Yes LLE Weight Bearing: Weight bearing as tolerated    Mobility  Bed Mobility Overal bed mobility: Needs Assistance Bed Mobility: Supine to Sit;Sit to Supine     Supine to sit: Min assist;HOB elevated Sit to supine: Min assist   General bed mobility comments: Pt remains to require min assist for LLE management, heavy use of rail with HOB elevated.  Cues for hand and foot placement.  min assistance also required for return back to bed.    Transfers Overall transfer level: Needs assistance Equipment used: Rolling walker (2 wheeled) Transfers: Sit to/from Stand Sit to Stand: Min guard Stand pivot transfers: Min guard       General transfer comment: Cues for hand placement to and from seated surface.  Cues for foot placement and forward weight shifting to ascend into standing.    Ambulation/Gait Ambulation/Gait assistance: Min guard Ambulation Distance (Feet): 200  Feet Assistive device: Rolling walker (2 wheeled) Gait Pattern/deviations: Step-to pattern;Trunk flexed;Decreased stride length;Decreased stance time - left;Decreased step length - right   Gait velocity interpretation: Below normal speed for age/gender General Gait Details: Cues for upper trunk control, relaxing B shoulder, L heel strike and toe off, and RW positioning.  Pt with better endurance.  Reports feeling hot this afternoon and required standing rest break x3.     Stairs            Wheelchair Mobility    Modified Rankin (Stroke Patients Only)       Balance Overall balance assessment: Needs assistance Sitting-balance support: No upper extremity supported;Feet supported Sitting balance-Leahy Scale: Fair     Standing balance support: Bilateral upper extremity supported;During functional activity Standing balance-Leahy Scale: Fair Standing balance comment: Patient with improved balance.                              Cognition Arousal/Alertness: Awake/alert Behavior During Therapy: WFL for tasks assessed/performed Overall Cognitive Status: Within Functional Limits for tasks assessed                                        Exercises Total Joint Exercises Ankle Circles/Pumps: AROM;Both;10 reps;Supine Quad Sets: AROM;Left;10 reps;Supine Short Arc Quad: AROM;Left;10 reps;Supine Heel Slides: AAROM;Left;10 reps;Supine Long Arc Quad: AROM;Left;10 reps;Seated    General Comments        Pertinent Vitals/Pain Pain  Assessment: 0-10 Pain Score: 5  Pain Location: L hip  Pain Descriptors / Indicators: Aching;Operative site guarding;Grimacing Pain Intervention(s): Monitored during session;Repositioned    Home Living                      Prior Function            PT Goals (current goals can now be found in the care plan section) Acute Rehab PT Goals Patient Stated Goal: to decrease pain  Potential to Achieve Goals:  Good Progress towards PT goals: Progressing toward goals    Frequency    7X/week      PT Plan Current plan remains appropriate    Co-evaluation              AM-PAC PT "6 Clicks" Daily Activity  Outcome Measure  Difficulty turning over in bed (including adjusting bedclothes, sheets and blankets)?: Total Difficulty moving from lying on back to sitting on the side of the bed? : Total Difficulty sitting down on and standing up from a chair with arms (e.g., wheelchair, bedside commode, etc,.)?: Total Help needed moving to and from a bed to chair (including a wheelchair)?: A Little Help needed walking in hospital room?: A Little Help needed climbing 3-5 steps with a railing? : A Little 6 Click Score: 12    End of Session Equipment Utilized During Treatment: Gait belt Activity Tolerance: Patient limited by pain Patient left: in chair;with call bell/phone within reach Nurse Communication: Mobility status PT Visit Diagnosis: Other abnormalities of gait and mobility (R26.89);Pain Pain - Right/Left: Left Pain - part of body: Hip     Time: 0940-7680 PT Time Calculation (min) (ACUTE ONLY): 29 min  Charges:  $Gait Training: 8-22 mins $Therapeutic Exercise: 8-22 mins                    G Codes:       Governor Rooks, PTA pager [phone]    Cristela Blue 11/06/2016, 3:58 PM

## 2016-11-06 NOTE — Op Note (Signed)
NAME:  Teston, Oliver                      ACCOUNT NO.:  MEDICAL RECORD NO.:  [id number]  LOCATION:                                 FACILITY:  PHYSICIAN:  Ninetta Lights, M.D.      DATE OF BIRTH:  DATE OF PROCEDURE:  11/05/2016 DATE OF DISCHARGE:                              OPERATIVE REPORT   PREOPERATIVE DIAGNOSIS:  Left hip end-stage arthritis, primary generalized.  POSTOPERATIVE DIAGNOSIS:  Left hip end-stage arthritis, primary generalized.  PROCEDURE:  Direct anterior left total hip replacement using a Stryker prosthesis.  A press-fit 50 mm acetabular component screw fixation x2, 36 mm internal diameter liner.  A press-fit #3 Accolade stem, 36, +0 BIOLOX head.  SURGEON:  Ninetta Lights, MD  ASSISTANT:  Arletha Pili, PA, present throughout the entire case and necessary for timely completion of procedure.  ANESTHESIA:  Spinal.  BLOOD LOSS:  200 mL.  BLOOD GIVEN:  None.  SPECIMENS:  None.  CULTURES:  None.  COMPLICATIONS:  None.  DRESSINGS:  Sterile compressive.  DESCRIPTION OF PROCEDURE:  The patient brought to the operating room and after adequate anesthesia had been obtained, placed on a Hana bed, prepped and draped in the usual sterile fashion.  Longitudinal incision direct anterior.  Fascia of the tensor incised.  Direct exposure to the front of the hip.  Deep fascia and capsule were excised.  Retractors put in place.  Femoral neck cut 1 fingerbreadth above the lesser trochanter. Femoral head removed.  Acetabulum brought up to good bleeding bone and fitted with a 50 mm cup at 40 degrees of abduction, slight anteversion. Fluoroscopic confirmation good position.  The cup was placed 2 screws through the cup.  36 mm internal diameter liner.  Using the [place]ore bed, the femur was then broached, fitted, and then instituted with a #3 Accolade stem.  After trials, 36 +0 BIOLOX head.  At completion, anatomic alignment, good fixation, equal leg lengths.  Wound  irrigated.  Injected with Exparel.  Fascia closed with Vicryl.  Subcutaneous with subcuticular closure.  Margins were injected with Marcaine.  Sterile compressive dressing applied.  Anesthesia reversed.  Brought to the recovery room.  Tolerated the surgery well. No complications.     Ninetta Lights, M.D.     DFM/MEDQ  D:  11/05/2016  T:  11/06/2016  Job:  [id number]

## 2016-11-07 LAB — CBC
HCT: 33.5 % — ABNORMAL LOW (ref 36.0–46.0)
HEMOGLOBIN: 10.3 g/dL — AB (ref 12.0–15.0)
MCH: 27.8 pg (ref 26.0–34.0)
MCHC: 30.7 g/dL (ref 30.0–36.0)
MCV: 90.5 fL (ref 78.0–100.0)
PLATELETS: 259 10*3/uL (ref 150–400)
RBC: 3.7 MIL/uL — AB (ref 3.87–5.11)
RDW: 13.7 % (ref 11.5–15.5)
WBC: 12.5 10*3/uL — AB (ref 4.0–10.5)

## 2016-11-07 LAB — BASIC METABOLIC PANEL
ANION GAP: 6 (ref 5–15)
BUN: 21 mg/dL — ABNORMAL HIGH (ref 6–20)
CHLORIDE: 107 mmol/L (ref 101–111)
CO2: 24 mmol/L (ref 22–32)
Calcium: 8.5 mg/dL — ABNORMAL LOW (ref 8.9–10.3)
Creatinine, Ser: 1.2 mg/dL — ABNORMAL HIGH (ref 0.44–1.00)
GFR calc Af Amer: 56 mL/min — ABNORMAL LOW (ref >60)
GFR, EST NON AFRICAN AMERICAN: 48 mL/min — AB (ref >60)
Glucose, Bld: 125 mg/dL — ABNORMAL HIGH (ref 65–99)
POTASSIUM: 4.3 mmol/L (ref 3.5–5.1)
SODIUM: 137 mmol/L (ref 135–145)

## 2016-11-07 NOTE — Progress Notes (Signed)
Subjective: 2 Days Post-Op Procedure(s) (LRB): LEFT TOTAL HIP ARTHROPLASTY ANTERIOR APPROACH (Left) Patient reports pain as mild.    Objective: Vital signs in last 24 hours: Temp:  [97.8 F (36.6 C)-98.5 F (36.9 C)] 97.8 F (36.6 C) (06/29 0622) Pulse Rate:  [75-79] 79 (06/29 0622) Resp:  [18] 18 (06/29 0622) BP: (90-133)/(48-58) 105/48 (06/29 0622) SpO2:  [93 %-100 %] 100 % (06/29 0622) Weight:  [98.6 kg (217 lb 6 oz)] 98.6 kg (217 lb 6 oz) (06/28 1158)  Intake/Output from previous day: 06/28 0701 - 06/29 0700 In: 480 [P.O.:480] Out: -  Intake/Output this shift: No intake/output data recorded.   Recent Labs  11/06/16 0646 11/07/16 0656  HGB 10.1* 10.3*    Recent Labs  11/06/16 0646 11/07/16 0656  WBC 13.6* 12.5*  RBC 3.62* 3.70*  HCT 31.9* 33.5*  PLT 245 259    Recent Labs  11/06/16 0646 11/07/16 0656  NA 136 137  K 4.8 4.3  CL 107 107  CO2 24 24  BUN 17 21*  CREATININE 1.30* 1.20*  GLUCOSE 123* 125*  CALCIUM 8.7* 8.5*   No results for input(s): LABPT, INR in the last 72 hours.  Neurologically intact Neurovascular intact Sensation intact distally Intact pulses distally Dorsiflexion/Plantar flexion intact Incision: scant drainage No cellulitis present Compartment soft  Assessment/Plan: 2 Days Post-Op Procedure(s) (LRB): LEFT TOTAL HIP ARTHROPLASTY ANTERIOR APPROACH (Left) Advance diet Up with therapy Discharge home with home health after first or second session of PT (depending on what patient wants to do) WBAT LLE ABLA-mild and stable Dry dressing change prn  Fannie Knee 11/07/2016, 8:13 AM

## 2016-11-07 NOTE — Progress Notes (Signed)
Physical Therapy Treatment Patient Details Name: [name] [name] MRN: [id number] DOB: Sep 28, 1955 Today's Date: 11/07/2016    History of Present Illness Pt is a 61 y/o female s/p L THA, anterior hip precautions. PMH includes plantar fasciitis, obesity, dysrhythmia, kiari malformation, s/p back surgery, and R THA.     PT Comments    Pt supine in bed on arrival.  Pt to d/c home today and patient assisted with dressing and performed PT treatment in pre for d/c.  Pt performed increased gait training and reviewed HEP including seated and standing exercises.  Pt reports increased pain in anterolateral hip.  Ice applied post tx and RN informed that patient is ready for d/c.    Follow Up Recommendations  DC plan and follow up therapy as arranged by surgeon;Supervision/Assistance - 24 hour     Equipment Recommendations  None recommended by PT    Recommendations for Other Services       Precautions / Restrictions Precautions Precautions: Anterior Hip Precaution Booklet Issued: Yes (comment) Precaution Comments: Pt unable to recall precautions initially required cues to recall.   Restrictions Weight Bearing Restrictions: Yes LLE Weight Bearing: Weight bearing as tolerated    Mobility  Bed Mobility Overal bed mobility: Modified Independent Bed Mobility: Supine to Sit     Supine to sit: Modified independent (Device/Increase time);HOB elevated (+ use of rail.  )     General bed mobility comments: Pt able to advance to edge of bed unassisted.  Sitting edge of bed patient required assistance to donn lower body dressing, she was able to donn upper body dressing unassisted.    Transfers Overall transfer level: Needs assistance Equipment used: Rolling walker (2 wheeled) Transfers: Sit to/from Stand Sit to Stand: Supervision Stand pivot transfers: Supervision       General transfer comment: Good technique and carryover for hand placement.    Ambulation/Gait Ambulation/Gait assistance:  Min guard Ambulation Distance (Feet): 250 Feet Assistive device: Rolling walker (2 wheeled) Gait Pattern/deviations: Step-to pattern;Trunk flexed;Decreased stride length;Step-through pattern   Gait velocity interpretation: Below normal speed for age/gender General Gait Details: Cues for progression to step through sequencing for more fluid gait pattern.  Pt required cues for L toe off and L heel strike.  Pt with better carryover.     Stairs            Wheelchair Mobility    Modified Rankin (Stroke Patients Only)       Balance Overall balance assessment: Needs assistance   Sitting balance-Leahy Scale: Good       Standing balance-Leahy Scale: Fair Standing balance comment: Patient with improved balance.                              Cognition Arousal/Alertness: Awake/alert Behavior During Therapy: WFL for tasks assessed/performed Overall Cognitive Status: Within Functional Limits for tasks assessed                                        Exercises Total Joint Exercises Ankle Circles/Pumps: AROM;Both;10 reps;Supine Quad Sets: AROM;Left;10 reps;Supine Short Arc Quad: AROM;Left;10 reps;Supine Heel Slides: AAROM;Left;10 reps;Supine Long Arc Quad: AROM;Left;10 reps;Seated Knee Flexion: AROM;Left;10 reps;Standing Marching in Standing: Left;10 reps;Standing;AAROM    General Comments        Pertinent Vitals/Pain Pain Assessment: 0-10 Pain Score: 5  Pain Location: L hip  Pain Descriptors /  Indicators: Aching;Operative site guarding;Grimacing Pain Intervention(s): Monitored during session;Repositioned    Home Living                      Prior Function            PT Goals (current goals can now be found in the care plan section) Acute Rehab PT Goals Potential to Achieve Goals: Good Progress towards PT goals: Progressing toward goals    Frequency    7X/week      PT Plan Current plan remains appropriate     Co-evaluation              AM-PAC PT "6 Clicks" Daily Activity  Outcome Measure  Difficulty turning over in bed (including adjusting bedclothes, sheets and blankets)?: None Difficulty moving from lying on back to sitting on the side of the bed? : None Difficulty sitting down on and standing up from a chair with arms (e.g., wheelchair, bedside commode, etc,.)?: A Little Help needed moving to and from a bed to chair (including a wheelchair)?: A Little Help needed walking in hospital room?: A Little Help needed climbing 3-5 steps with a railing? : A Little 6 Click Score: 20    End of Session Equipment Utilized During Treatment: Gait belt Activity Tolerance: Patient limited by pain Patient left: in chair;with call bell/phone within reach Nurse Communication: Mobility status PT Visit Diagnosis: Other abnormalities of gait and mobility (R26.89);Pain Pain - Right/Left: Left Pain - part of body: Hip     Time: 1140-1209 PT Time Calculation (min) (ACUTE ONLY): 29 min  Charges:  $Gait Training: 8-22 mins $Therapeutic Exercise: 8-22 mins                    G Codes:       Governor Rooks, PTA pager [phone]    Cristela Blue 11/07/2016, 12:22 PM

## 2016-11-08 DIAGNOSIS — Z471 Aftercare following joint replacement surgery: Secondary | ICD-10-CM | POA: Diagnosis not present

## 2016-11-08 DIAGNOSIS — M5136 Other intervertebral disc degeneration, lumbar region: Secondary | ICD-10-CM | POA: Diagnosis not present

## 2016-11-10 DIAGNOSIS — M5136 Other intervertebral disc degeneration, lumbar region: Secondary | ICD-10-CM | POA: Diagnosis not present

## 2016-11-10 DIAGNOSIS — Z471 Aftercare following joint replacement surgery: Secondary | ICD-10-CM | POA: Diagnosis not present

## 2016-11-14 DIAGNOSIS — M5136 Other intervertebral disc degeneration, lumbar region: Secondary | ICD-10-CM | POA: Diagnosis not present

## 2016-11-14 DIAGNOSIS — Z471 Aftercare following joint replacement surgery: Secondary | ICD-10-CM | POA: Diagnosis not present

## 2016-11-19 DIAGNOSIS — M5136 Other intervertebral disc degeneration, lumbar region: Secondary | ICD-10-CM | POA: Diagnosis not present

## 2016-11-19 DIAGNOSIS — Z471 Aftercare following joint replacement surgery: Secondary | ICD-10-CM | POA: Diagnosis not present

## 2016-11-21 DIAGNOSIS — Z471 Aftercare following joint replacement surgery: Secondary | ICD-10-CM | POA: Diagnosis not present

## 2016-11-21 DIAGNOSIS — M5136 Other intervertebral disc degeneration, lumbar region: Secondary | ICD-10-CM | POA: Diagnosis not present

## 2016-11-24 DIAGNOSIS — R262 Difficulty in walking, not elsewhere classified: Secondary | ICD-10-CM | POA: Diagnosis not present

## 2016-11-24 DIAGNOSIS — M25552 Pain in left hip: Secondary | ICD-10-CM | POA: Diagnosis not present

## 2016-11-24 DIAGNOSIS — M1612 Unilateral primary osteoarthritis, left hip: Secondary | ICD-10-CM | POA: Diagnosis not present

## 2016-11-27 DIAGNOSIS — M1612 Unilateral primary osteoarthritis, left hip: Secondary | ICD-10-CM | POA: Diagnosis not present

## 2016-11-27 DIAGNOSIS — M25552 Pain in left hip: Secondary | ICD-10-CM | POA: Diagnosis not present

## 2016-11-27 DIAGNOSIS — R262 Difficulty in walking, not elsewhere classified: Secondary | ICD-10-CM | POA: Diagnosis not present

## 2016-12-02 DIAGNOSIS — R262 Difficulty in walking, not elsewhere classified: Secondary | ICD-10-CM | POA: Diagnosis not present

## 2016-12-02 DIAGNOSIS — M1612 Unilateral primary osteoarthritis, left hip: Secondary | ICD-10-CM | POA: Diagnosis not present

## 2016-12-02 DIAGNOSIS — M25552 Pain in left hip: Secondary | ICD-10-CM | POA: Diagnosis not present

## 2016-12-04 DIAGNOSIS — M1612 Unilateral primary osteoarthritis, left hip: Secondary | ICD-10-CM | POA: Diagnosis not present

## 2016-12-04 DIAGNOSIS — M25552 Pain in left hip: Secondary | ICD-10-CM | POA: Diagnosis not present

## 2016-12-04 DIAGNOSIS — R531 Weakness: Secondary | ICD-10-CM | POA: Diagnosis not present

## 2016-12-08 DIAGNOSIS — M1612 Unilateral primary osteoarthritis, left hip: Secondary | ICD-10-CM | POA: Diagnosis not present

## 2016-12-08 DIAGNOSIS — R262 Difficulty in walking, not elsewhere classified: Secondary | ICD-10-CM | POA: Diagnosis not present

## 2016-12-08 DIAGNOSIS — M25552 Pain in left hip: Secondary | ICD-10-CM | POA: Diagnosis not present

## 2016-12-12 DIAGNOSIS — R262 Difficulty in walking, not elsewhere classified: Secondary | ICD-10-CM | POA: Diagnosis not present

## 2016-12-12 DIAGNOSIS — M1612 Unilateral primary osteoarthritis, left hip: Secondary | ICD-10-CM | POA: Diagnosis not present

## 2016-12-12 DIAGNOSIS — M25552 Pain in left hip: Secondary | ICD-10-CM | POA: Diagnosis not present

## 2016-12-16 DIAGNOSIS — R262 Difficulty in walking, not elsewhere classified: Secondary | ICD-10-CM | POA: Diagnosis not present

## 2016-12-16 DIAGNOSIS — M1612 Unilateral primary osteoarthritis, left hip: Secondary | ICD-10-CM | POA: Diagnosis not present

## 2016-12-16 DIAGNOSIS — M25552 Pain in left hip: Secondary | ICD-10-CM | POA: Diagnosis not present

## 2016-12-23 DIAGNOSIS — R532 Functional quadriplegia: Secondary | ICD-10-CM | POA: Diagnosis not present

## 2016-12-23 DIAGNOSIS — M25552 Pain in left hip: Secondary | ICD-10-CM | POA: Diagnosis not present

## 2016-12-23 DIAGNOSIS — M1612 Unilateral primary osteoarthritis, left hip: Secondary | ICD-10-CM | POA: Diagnosis not present

## 2017-01-17 DIAGNOSIS — Z713 Dietary counseling and surveillance: Secondary | ICD-10-CM | POA: Diagnosis not present

## 2017-01-17 DIAGNOSIS — Z136 Encounter for screening for cardiovascular disorders: Secondary | ICD-10-CM | POA: Diagnosis not present

## 2017-02-02 DIAGNOSIS — J209 Acute bronchitis, unspecified: Secondary | ICD-10-CM | POA: Diagnosis not present

## 2017-04-07 DIAGNOSIS — R1013 Epigastric pain: Secondary | ICD-10-CM | POA: Diagnosis not present

## 2017-04-07 DIAGNOSIS — R1110 Vomiting, unspecified: Secondary | ICD-10-CM | POA: Diagnosis not present

## 2017-04-07 DIAGNOSIS — R197 Diarrhea, unspecified: Secondary | ICD-10-CM | POA: Diagnosis not present

## 2017-04-27 DIAGNOSIS — J019 Acute sinusitis, unspecified: Secondary | ICD-10-CM | POA: Diagnosis not present

## 2017-06-18 DIAGNOSIS — H1033 Unspecified acute conjunctivitis, bilateral: Secondary | ICD-10-CM | POA: Diagnosis not present

## 2017-09-24 DIAGNOSIS — J329 Chronic sinusitis, unspecified: Secondary | ICD-10-CM | POA: Diagnosis not present

## 2017-09-24 DIAGNOSIS — R05 Cough: Secondary | ICD-10-CM | POA: Diagnosis not present

## 2017-09-29 DIAGNOSIS — R05 Cough: Secondary | ICD-10-CM | POA: Diagnosis not present

## 2017-09-29 DIAGNOSIS — J019 Acute sinusitis, unspecified: Secondary | ICD-10-CM | POA: Diagnosis not present

## 2017-10-01 ENCOUNTER — Encounter: Payer: Self-pay | Admitting: Emergency Medicine

## 2017-10-01 ENCOUNTER — Other Ambulatory Visit: Payer: Self-pay

## 2017-10-01 ENCOUNTER — Emergency Department
Admission: EM | Admit: 2017-10-01 | Discharge: 2017-10-01 | Disposition: A | Discharge status: Home / Self Care | Payer: 59 | Attending: Emergency Medicine | Admitting: Emergency Medicine

## 2017-10-01 ENCOUNTER — Emergency Department: Payer: 59

## 2017-10-01 DIAGNOSIS — J209 Acute bronchitis, unspecified: Secondary | ICD-10-CM | POA: Insufficient documentation

## 2017-10-01 DIAGNOSIS — Z79899 Other long term (current) drug therapy: Secondary | ICD-10-CM | POA: Diagnosis not present

## 2017-10-01 DIAGNOSIS — R042 Hemoptysis: Secondary | ICD-10-CM | POA: Diagnosis present

## 2017-10-01 DIAGNOSIS — R0602 Shortness of breath: Secondary | ICD-10-CM | POA: Diagnosis not present

## 2017-10-01 DIAGNOSIS — E039 Hypothyroidism, unspecified: Secondary | ICD-10-CM | POA: Diagnosis not present

## 2017-10-01 DIAGNOSIS — R05 Cough: Secondary | ICD-10-CM | POA: Diagnosis not present

## 2017-10-01 LAB — CBC WITH DIFFERENTIAL/PLATELET
Basophils Absolute: 0.1 10*3/uL (ref 0–0.1)
Basophils Relative: 1 %
Eosinophils Absolute: 0.3 10*3/uL (ref 0–0.7)
Eosinophils Relative: 3 %
HEMATOCRIT: 40.8 % (ref 35.0–47.0)
HEMOGLOBIN: 13.4 g/dL (ref 12.0–16.0)
LYMPHS ABS: 3 10*3/uL (ref 1.0–3.6)
Lymphocytes Relative: 29 %
MCH: 27.8 pg (ref 26.0–34.0)
MCHC: 32.9 g/dL (ref 32.0–36.0)
MCV: 84.6 fL (ref 80.0–100.0)
Monocytes Absolute: 0.7 10*3/uL (ref 0.2–0.9)
Monocytes Relative: 6 %
NEUTROS ABS: 6.1 10*3/uL (ref 1.4–6.5)
NEUTROS PCT: 61 %
Platelets: 375 10*3/uL (ref 150–440)
RBC: 4.82 MIL/uL (ref 3.80–5.20)
RDW: 13.9 % (ref 11.5–14.5)
WBC: 10.2 10*3/uL (ref 3.6–11.0)

## 2017-10-01 LAB — COMPREHENSIVE METABOLIC PANEL
ALBUMIN: 4.7 g/dL (ref 3.5–5.0)
ALK PHOS: 70 U/L (ref 38–126)
ALT: 29 U/L (ref 14–54)
AST: 41 U/L (ref 15–41)
Anion gap: 11 (ref 5–15)
BILIRUBIN TOTAL: 0.4 mg/dL (ref 0.3–1.2)
BUN: 16 mg/dL (ref 6–20)
CALCIUM: 9.7 mg/dL (ref 8.9–10.3)
CO2: 22 mmol/L (ref 22–32)
Chloride: 108 mmol/L (ref 101–111)
Creatinine, Ser: 1.03 mg/dL — ABNORMAL HIGH (ref 0.44–1.00)
GFR calc Af Amer: >60 mL/min (ref >60)
GFR calc non Af Amer: 58 mL/min — ABNORMAL LOW (ref >60)
GLUCOSE: 99 mg/dL (ref 65–99)
POTASSIUM: 3.8 mmol/L (ref 3.5–5.1)
SODIUM: 141 mmol/L (ref 135–145)
TOTAL PROTEIN: 7.8 g/dL (ref 6.5–8.1)

## 2017-10-01 LAB — PROTIME-INR
INR: 0.89
Prothrombin Time: 12 seconds (ref 11.4–15.2)

## 2017-10-01 MED ORDER — AZITHROMYCIN 250 MG PO TABS: ORAL_TABLET | ORAL | 0 refills | Status: DC

## 2017-10-01 MED ORDER — PREDNISONE 10 MG (21) PO TBPK: ORAL_TABLET | ORAL | 0 refills | Status: DC

## 2017-10-01 MED ORDER — METHYLPREDNISOLONE SODIUM SUCC 125 MG IJ SOLR
125.0000 mg | Freq: Once | INTRAMUSCULAR | Status: AC
  Administered 2017-10-01: 125 mg via INTRAVENOUS
  Filled 2017-10-01: qty 2

## 2017-10-01 MED ORDER — IPRATROPIUM-ALBUTEROL 0.5-2.5 (3) MG/3ML IN SOLN
3.0000 mL | Freq: Once | RESPIRATORY_TRACT | Status: AC
  Administered 2017-10-01: 3 mL via RESPIRATORY_TRACT
  Filled 2017-10-01: qty 3

## 2017-10-01 MED ORDER — GUAIFENESIN-CODEINE 100-10 MG/5ML PO SOLN: 10.0000 mL | Freq: Three times a day (TID) | ORAL | 0 refills | Status: DC | PRN

## 2017-10-01 NOTE — ED Notes (Signed)
See triage note  Presents with cough for the past couple of days  Currently being treated for sinus infection  But noticed some bloody streaks with cough

## 2017-10-01 NOTE — ED Provider Notes (Signed)
[facility] Emergency Department Provider Note  ____________________________________________  Time seen: Approximately 11:45 PM  I have reviewed the triage vital signs and the nursing notes.   HISTORY  Chief Complaint Hemoptysis   HPI [name] [name] is a 62 y.o. female who presents to the emergency department for treatment and evaluation of cough that started a couple of days ago.  She states that she was started on amoxicillin on 09/24/2017 by her primary care provider after being diagnosed with a sinus infection.  She states that she was coughing at that time but the doctor advised her that it was coming from her sinuses.  She was also given a prescription for [facility] which has not really helped with her cough.  She states that today, she has been coughing so hard that she has almost vomited.  She has noticed some bright red streaks of blood in the mucus that she is coughing up.  No known fever.  She does not smoke.  Past Medical History:  Diagnosis Date  . Anxiety   . DDD (degenerative disc disease), lumbar    BACK, FINGER, WRIST  . DDD (degenerative disc disease), lumbar   . Depression   . Diverticulitis   . Dysrhythmia   . GERD (gastroesophageal reflux disease)   . Headache   . Hypothyroidism   . Irregular heart rate   . Pneumonia    hx  . Sleep apnea    unable to wear mask-sleep study 3-4 yrs ago  . Spine malformation    Kiari Malformation  . Thyroid disease   . Wears dentures    UPPER    Patient Active Problem List   Diagnosis Date Noted  . Primary localized osteoarthritis of left hip 11/05/2016  . Generalized abdominal pain 01/08/2015  . Obesity 01/08/2015  . Dizziness 01/08/2015  . HYPOTHYROIDISM 11/07/2009  . CALLUSES, LEFT FOOT 11/07/2009  . FASCIITIS, [place] 11/07/2009    Past Surgical History:  Procedure Laterality Date  . ABDOMINAL HYSTERECTOMY  62 years old  . APPENDECTOMY  62 years old  . BACK SURGERY  1992   l4  and l5  . CARPAL TUNNEL RELEASE Bilateral 2012  . COLONOSCOPY WITH PROPOFOL N/A 08/16/2015   Procedure: COLONOSCOPY WITH PROPOFOL;  Surgeon: Lucilla Lame, MD;  Location: [place];  Service: Endoscopy;  Laterality: N/A;  . CRANIECTOMY SUBOCCIPITAL W/ CERVICAL LAMINECTOMY / CHIARI  08  . JOINT REPLACEMENT    . LAPAROSCOPIC SIGMOID COLECTOMY N/A 09/06/2015   Procedure: open sigmoid and descending colectomy, lysis of adhesions, omenteum flap;  Surgeon: Jules Husbands, MD;  Location: [facility];  Service: General;  Laterality: N/A;  . POLYPECTOMY  08/16/2015   Procedure: POLYPECTOMY;  Surgeon: Lucilla Lame, MD;  Location: [place];  Service: Endoscopy;;  . SPINAL FUSION    . TONSILLECTOMY  as child  . TOTAL HIP ARTHROPLASTY Right 01/12/2013   Procedure: TOTAL HIP ARTHROPLASTY WITH AUTOGRAFT;  Surgeon: Ninetta Lights, MD;  Location: [place];  Service: Orthopedics;  Laterality: Right;  . TOTAL HIP ARTHROPLASTY Left 11/05/2016   Procedure: LEFT TOTAL HIP ARTHROPLASTY ANTERIOR APPROACH;  Surgeon: Ninetta Lights, MD;  Location: [place];  Service: Orthopedics;  Laterality: Left;    Prior to Admission medications   Medication Sig Start Date End Date Taking? Authorizing Provider  aspirin EC 325 MG tablet Take 1 tablet (325 mg total) by mouth daily. 1 tab a day for the next 30 days to prevent blood clots 11/05/16  Aundra Dubin, PA-C  azithromycin (ZITHROMAX) 250 MG tablet 2 tablets today, then 1 tablet for the next 4 days. 10/01/17   Reann Dobias B, FNP  CALCIUM-VITAMIN D PO Take 1 tablet by mouth daily.    [provider]  DULoxetine (CYMBALTA) 60 MG capsule Take 120 mg by mouth daily.  05/15/15   [provider]  fenofibrate 160 MG tablet Take 160 mg by mouth daily.     [provider]  guaiFENesin-codeine 100-10 MG/5ML syrup Take 10 mLs by mouth 3 (three) times daily as needed. 10/01/17   Dhruvan Gullion, Johnette Abraham B, FNP  levothyroxine (SYNTHROID, LEVOTHROID) 125 MCG tablet  Take 125 mcg by mouth daily before breakfast.    [provider]  methocarbamol (ROBAXIN) 500 MG tablet Take 1 tablet (500 mg total) by mouth 4 (four) times daily. 11/05/16   Aundra Dubin, PA-C  Multiple Vitamin (MULTIVITAMIN WITH MINERALS) TABS tablet Take 1 tablet by mouth daily.     [provider]  omeprazole (PRILOSEC) 20 MG capsule Take 20 mg by mouth daily.     [provider]  ondansetron (ZOFRAN) 4 MG tablet Take 1 tablet (4 mg total) by mouth every 8 (eight) hours as needed for nausea or vomiting. 11/05/16   Aundra Dubin, PA-C  oxyCODONE-acetaminophen (ROXICET) 5-325 MG tablet Take 1-2 tablets by mouth every 4 (four) hours as needed. 11/05/16   Aundra Dubin, PA-C  predniSONE (STERAPRED UNI-PAK 21 TAB) 10 MG (21) TBPK tablet Take 6 tablets on day 1 Take 5 tablets on day 2 Take 4 tablets on day 3 Take 3 tablets on day 4 Take 2 tablets on day 5 Take 1 tablet on day 6 10/01/17   Leathie Weich B, FNP  traZODone (DESYREL) 150 MG tablet Take 150 mg by mouth at bedtime as needed for sleep.    [provider]    Allergies Cephalexin; Morphine; Naprosyn [naproxen]; Antihistamines, diphenhydramine-type; and Diphenhydramine hcl  Family History  Problem Relation Age of Onset  . COPD Mother   . Stroke Mother   . Cancer Father     Social History Social History   Tobacco Use  . Smoking status: Never Smoker  . Smokeless tobacco: Never Used  . Tobacco comment: 2004 last crack   occ alcohol  Substance Use Topics  . Alcohol use: No    Comment: 2 beers a year  . Drug use: Yes    Frequency: 7.0 times per week    Types: Marijuana    Comment: quit crack 12 years ago. marijuana use for sleep    Review of Systems Constitutional: Negative for fever/chills ENT: Positive for sore throat. Cardiovascular: Denies chest pain. Respiratory: Positive shortness of breath.  Positive for cough. Gastrointestinal: Negative nausea, negative for vomiting.  No  diarrhea.  Musculoskeletal: Negative for body aches Skin: Negative for rash. Neurological: Negative for headaches ____________________________________________   PHYSICAL EXAM:  VITAL SIGNS: ED Triage Vitals  Enc Vitals Group     BP 10/01/17 1607 (!) 145/78     Pulse Rate 10/01/17 1607 83     Resp 10/01/17 1607 16     Temp 10/01/17 1607 98.2 F (36.8 C)     Temp Source 10/01/17 1607 Oral     SpO2 10/01/17 1607 97 %     Weight 10/01/17 1610 210 lb (95.3 kg)     Height 10/01/17 1610 5\' 5"  (1.651 m)     Head Circumference --      Peak Flow --  Pain Score 10/01/17 1610 7     Pain Loc --      Pain Edu? --      Excl. in [place]? --     Constitutional: Alert and oriented.  Acutely ill appearing and in no acute distress. Eyes: Conjunctivae are normal. EOMI. Ears: Bilateral tympanic membranes are normal Nose: No sinus congestion noted; no rhinnorhea. Mouth/Throat: Mucous membranes are moist.  Oropharynx mildly erythematous. Tonsils not visualized. Neck: No stridor.  Lymphatic: No cervical lymphadenopathy. Cardiovascular: Normal rate, regular rhythm. Good peripheral circulation. Respiratory: Normal respiratory effort.  No retractions.  Rhonchi noted throughout.  Some expiratory wheezes are also present throughout.. Gastrointestinal: Soft and nontender.  Musculoskeletal: FROM x 4 extremities.  Neurologic:  Normal speech and language.  Skin:  Skin is warm, dry and intact. No rash noted. Psychiatric: Mood and affect are normal. Speech and behavior are normal.  ____________________________________________   LABS (all labs ordered are listed, but only abnormal results are displayed)  Labs Reviewed  COMPREHENSIVE METABOLIC PANEL - Abnormal; Notable for the following components:      Result Value   Creatinine, Ser 1.03 (*)    GFR calc non Af Amer 58 (*)    All other components within normal limits  CBC WITH DIFFERENTIAL/PLATELET  PROTIME-INR    ____________________________________________  EKG  Not indicated ____________________________________________  RADIOLOGY  Chest x-ray is negative for acute cardiopulmonary abnormality per radiology. ____________________________________________   PROCEDURES  Procedure(s) performed: None  Critical Care performed: No ____________________________________________   INITIAL IMPRESSION / ASSESSMENT AND PLAN / ED COURSE  62 y.o. female who presents to the emergency department for treatment and evaluation of cough.  Streaks of blood the patient noticed in her mucus was likely secondary to irritation from her throat.  Her labs including her PT/INR are reassuring and the patient is not on any blood thinners.  She will be treated for a atypical pneumonia/bronchitis.  She was encouraged to stop taking the amoxicillin and start the azithromycin.  She was also placed on a burst dose of prednisone and will be given guaifenesin with codeine to be taken especially at night to help her sleep.  She was advised to follow-up with her primary care provider next week for recheck or return to the emergency department for symptoms that change or worsen.  Medications  ipratropium-albuterol (DUONEB) 0.5-2.5 (3) MG/3ML nebulizer solution 3 mL (3 mLs Nebulization Given 10/01/17 1703)  methylPREDNISolone sodium succinate (SOLU-MEDROL) 125 mg/2 mL injection 125 mg (125 mg Intravenous Given 10/01/17 1703)    ED Discharge Orders        Ordered    azithromycin (ZITHROMAX) 250 MG tablet     10/01/17 1815    predniSONE (STERAPRED UNI-PAK 21 TAB) 10 MG (21) TBPK tablet     10/01/17 1815    guaiFENesin-codeine 100-10 MG/5ML syrup  3 times daily PRN     10/01/17 1815       Pertinent labs & imaging results that were available during my care of the patient were reviewed by me and considered in my medical decision making (see chart for details).    If controlled substance prescribed during this visit, 12 month  history viewed on the [place] prior to issuing an initial prescription for Schedule II or III opiod. ____________________________________________   FINAL CLINICAL IMPRESSION(S) / ED DIAGNOSES  Final diagnoses:  Acute bronchitis, unspecified organism    Note:  This document was prepared using Dragon voice recognition software and may include unintentional dictation errors.  Victorino Dike, FNP 10/01/17 2351    Carrie Mew, MD 10/07/17 [phone]

## 2017-10-01 NOTE — ED Triage Notes (Signed)
Pt to ED from home c/o cough and coughing up blood x2 days.  States dx with sinus infection 5/16 and started on ABX and cough medicine.  States clear sputum now with streaks of bright red blood.

## 2017-10-31 ENCOUNTER — Emergency Department
Admission: EM | Admit: 2017-10-31 | Discharge: 2017-10-31 | Disposition: A | Discharge status: Home / Self Care | Payer: 59 | Attending: Student in an Organized Health Care Education/Training Program | Admitting: Student in an Organized Health Care Education/Training Program

## 2017-10-31 ENCOUNTER — Emergency Department: Payer: 59

## 2017-10-31 ENCOUNTER — Encounter: Payer: Self-pay | Admitting: Emergency Medicine

## 2017-10-31 DIAGNOSIS — K529 Noninfective gastroenteritis and colitis, unspecified: Secondary | ICD-10-CM | POA: Insufficient documentation

## 2017-10-31 DIAGNOSIS — Z7982 Long term (current) use of aspirin: Secondary | ICD-10-CM | POA: Diagnosis not present

## 2017-10-31 DIAGNOSIS — Z79899 Other long term (current) drug therapy: Secondary | ICD-10-CM | POA: Insufficient documentation

## 2017-10-31 DIAGNOSIS — Z96643 Presence of artificial hip joint, bilateral: Secondary | ICD-10-CM | POA: Diagnosis not present

## 2017-10-31 DIAGNOSIS — E039 Hypothyroidism, unspecified: Secondary | ICD-10-CM | POA: Diagnosis not present

## 2017-10-31 DIAGNOSIS — R1031 Right lower quadrant pain: Secondary | ICD-10-CM | POA: Diagnosis not present

## 2017-10-31 DIAGNOSIS — R109 Unspecified abdominal pain: Secondary | ICD-10-CM | POA: Diagnosis present

## 2017-10-31 DIAGNOSIS — R1030 Lower abdominal pain, unspecified: Secondary | ICD-10-CM | POA: Diagnosis not present

## 2017-10-31 LAB — CBC
HCT: 38.6 % (ref 35.0–47.0)
HEMOGLOBIN: 13.2 g/dL (ref 12.0–16.0)
MCH: 29.3 pg (ref 26.0–34.0)
MCHC: 34.1 g/dL (ref 32.0–36.0)
MCV: 85.9 fL (ref 80.0–100.0)
Platelets: 373 10*3/uL (ref 150–440)
RBC: 4.5 MIL/uL (ref 3.80–5.20)
RDW: 14.1 % (ref 11.5–14.5)
WBC: 7.6 10*3/uL (ref 3.6–11.0)

## 2017-10-31 LAB — URINALYSIS, COMPLETE (UACMP) WITH MICROSCOPIC
BILIRUBIN URINE: NEGATIVE
Glucose, UA: NEGATIVE mg/dL
Ketones, ur: NEGATIVE mg/dL
NITRITE: POSITIVE — AB
PROTEIN: NEGATIVE mg/dL
Specific Gravity, Urine: 1.006 (ref 1.005–1.030)
pH: 7 (ref 5.0–8.0)

## 2017-10-31 LAB — COMPREHENSIVE METABOLIC PANEL
ALK PHOS: 52 U/L (ref 38–126)
ALT: 28 U/L (ref 14–54)
ANION GAP: 9 (ref 5–15)
AST: 33 U/L (ref 15–41)
Albumin: 4.3 g/dL (ref 3.5–5.0)
BUN: 18 mg/dL (ref 6–20)
CO2: 19 mmol/L — AB (ref 22–32)
Calcium: 9.4 mg/dL (ref 8.9–10.3)
Chloride: 109 mmol/L (ref 101–111)
Creatinine, Ser: 0.92 mg/dL (ref 0.44–1.00)
GFR calc non Af Amer: >60 mL/min (ref >60)
Glucose, Bld: 155 mg/dL — ABNORMAL HIGH (ref 65–99)
POTASSIUM: 4.1 mmol/L (ref 3.5–5.1)
SODIUM: 137 mmol/L (ref 135–145)
Total Bilirubin: 0.4 mg/dL (ref 0.3–1.2)
Total Protein: 7.1 g/dL (ref 6.5–8.1)

## 2017-10-31 MED ORDER — CIPROFLOXACIN HCL 500 MG PO TABS: 500.0000 mg | ORAL_TABLET | Freq: Two times a day (BID) | ORAL | 0 refills | Status: AC

## 2017-10-31 MED ORDER — SODIUM CHLORIDE 0.9 % IV BOLUS
500.0000 mL | Freq: Once | INTRAVENOUS | Status: AC
  Administered 2017-10-31: 500 mL via INTRAVENOUS

## 2017-10-31 MED ORDER — NITROFURANTOIN MONOHYD MACRO 100 MG PO CAPS
100.0000 mg | ORAL_CAPSULE | Freq: Once | ORAL | Status: AC
Start: 2017-10-31 — End: 2017-10-31
  Administered 2017-10-31: 100 mg via ORAL
  Filled 2017-10-31: qty 1

## 2017-10-31 MED ORDER — METRONIDAZOLE IN NACL 5-0.79 MG/ML-% IV SOLN
500.0000 mg | Freq: Once | INTRAVENOUS | Status: AC
  Administered 2017-10-31: 500 mg via INTRAVENOUS
  Filled 2017-10-31: qty 100

## 2017-10-31 MED ORDER — METOCLOPRAMIDE HCL 10 MG PO TABS: 10.0000 mg | ORAL_TABLET | Freq: Three times a day (TID) | ORAL | 0 refills | Status: DC | PRN

## 2017-10-31 MED ORDER — METOCLOPRAMIDE HCL 5 MG/ML IJ SOLN
10.0000 mg | Freq: Once | INTRAMUSCULAR | Status: AC
  Administered 2017-10-31: 10 mg via INTRAVENOUS
  Filled 2017-10-31: qty 2

## 2017-10-31 MED ORDER — IOPAMIDOL (ISOVUE-300) INJECTION 61%
100.0000 mL | Freq: Once | INTRAVENOUS | Status: AC | PRN
  Administered 2017-10-31: 100 mL via INTRAVENOUS

## 2017-10-31 MED ORDER — HYDROCODONE-ACETAMINOPHEN 5-325 MG PO TABS: 1.0000 | ORAL_TABLET | ORAL | 0 refills | Status: DC | PRN

## 2017-10-31 MED ORDER — CIPROFLOXACIN IN D5W 400 MG/200ML IV SOLN
400.0000 mg | Freq: Once | INTRAVENOUS | Status: AC
  Administered 2017-10-31: 400 mg via INTRAVENOUS
  Filled 2017-10-31: qty 200

## 2017-10-31 MED ORDER — HYDROCODONE-ACETAMINOPHEN 5-325 MG PO TABS
1.0000 | ORAL_TABLET | Freq: Once | ORAL | Status: AC
  Administered 2017-10-31: 1 via ORAL
  Filled 2017-10-31: qty 1

## 2017-10-31 MED ORDER — METRONIDAZOLE 500 MG PO TABS: 500.0000 mg | ORAL_TABLET | Freq: Two times a day (BID) | ORAL | 0 refills | Status: AC

## 2017-10-31 MED ORDER — FENTANYL CITRATE (PF) 100 MCG/2ML IJ SOLN
50.0000 ug | Freq: Once | INTRAMUSCULAR | Status: AC
  Administered 2017-10-31: 50 ug via INTRAVENOUS
  Filled 2017-10-31: qty 2

## 2017-10-31 NOTE — ED Notes (Signed)
Ciprofloxacin started via pump at this time after Flagyl infusion was finished.

## 2017-10-31 NOTE — ED Notes (Signed)
Patient called family member to pick her up. Patient agreed to be discharged to lobby and wait for transport. Patient was cautioned not to drive and agreed not to driver herself home.

## 2017-10-31 NOTE — ED Triage Notes (Addendum)
Patient states that she had a hard BM on Thursday. Patient states that since then she has had intermittent sharpe pain in her lower abdomen and rectum. Patient states that she has a history of abscess in her colon and the pain feels similar.

## 2017-10-31 NOTE — ED Notes (Signed)
Assisted pt to the bathroom. Put pt back in bed on phone with husband.

## 2017-10-31 NOTE — ED Provider Notes (Signed)
[facility] Emergency Department Provider Note    First MD Initiated Contact with Patient 10/31/17 [phone]     (approximate)  I have reviewed the triage vital signs and the nursing notes.   HISTORY  Chief Complaint Abdominal Pain and Rectal Pain    HPI [name] [name] is a 62 y.o. female history of diverticulitis complicated by abscess requiring partial colectomy presents the ER with several days of progressively worsening initially right lower quadrant pain as well as sensation of tenesmus.  Denies any fevers.  Is having trouble keeping any food down.  Having trouble moving her bowels.  Tried an enema without any relief.  States she is also urinating more frequently and having some dysuria.  States currently the pain is mild to moderate and nonradiating.  No shortness of breath or chest pain.    Past Medical History:  Diagnosis Date  . Anxiety   . DDD (degenerative disc disease), lumbar    BACK, FINGER, WRIST  . DDD (degenerative disc disease), lumbar   . Depression   . Diverticulitis   . Dysrhythmia   . GERD (gastroesophageal reflux disease)   . Headache   . Hypothyroidism   . Irregular heart rate   . Pneumonia    hx  . Sleep apnea    unable to wear mask-sleep study 3-4 yrs ago  . Spine malformation    Kiari Malformation  . Thyroid disease   . Wears dentures    UPPER   Family History  Problem Relation Age of Onset  . COPD Mother   . Stroke Mother   . Cancer Father    Past Surgical History:  Procedure Laterality Date  . ABDOMINAL HYSTERECTOMY  62 years old  . APPENDECTOMY  62 years old  . BACK SURGERY  1992   l4 and l5  . CARPAL TUNNEL RELEASE Bilateral 2012  . COLONOSCOPY WITH PROPOFOL N/A 08/16/2015   Procedure: COLONOSCOPY WITH PROPOFOL;  Surgeon: Lucilla Lame, MD;  Location: [place];  Service: Endoscopy;  Laterality: N/A;  . CRANIECTOMY SUBOCCIPITAL W/ CERVICAL LAMINECTOMY / CHIARI  08  . JOINT REPLACEMENT    .  LAPAROSCOPIC SIGMOID COLECTOMY N/A 09/06/2015   Procedure: open sigmoid and descending colectomy, lysis of adhesions, omenteum flap;  Surgeon: Jules Husbands, MD;  Location: [facility];  Service: General;  Laterality: N/A;  . POLYPECTOMY  08/16/2015   Procedure: POLYPECTOMY;  Surgeon: Lucilla Lame, MD;  Location: [place];  Service: Endoscopy;;  . SPINAL FUSION    . TONSILLECTOMY  as child  . TOTAL HIP ARTHROPLASTY Right 01/12/2013   Procedure: TOTAL HIP ARTHROPLASTY WITH AUTOGRAFT;  Surgeon: Ninetta Lights, MD;  Location: [place];  Service: Orthopedics;  Laterality: Right;  . TOTAL HIP ARTHROPLASTY Left 11/05/2016   Procedure: LEFT TOTAL HIP ARTHROPLASTY ANTERIOR APPROACH;  Surgeon: Ninetta Lights, MD;  Location: [place];  Service: Orthopedics;  Laterality: Left;   Patient Active Problem List   Diagnosis Date Noted  . Primary localized osteoarthritis of left hip 11/05/2016  . Generalized abdominal pain 01/08/2015  . Obesity 01/08/2015  . Dizziness 01/08/2015  . HYPOTHYROIDISM 11/07/2009  . CALLUSES, LEFT FOOT 11/07/2009  . FASCIITIS, [place] 11/07/2009      Prior to Admission medications   Medication Sig Start Date End Date Taking? Authorizing Provider  aspirin EC 325 MG tablet Take 1 tablet (325 mg total) by mouth daily. 1 tab a day for the next 30 days to prevent blood clots  11/05/16  Yes Aundra Dubin, PA-C  DULoxetine (CYMBALTA) 60 MG capsule Take 120 mg by mouth daily.  05/15/15  Yes [provider]  fenofibrate 160 MG tablet Take 160 mg by mouth daily.    Yes [provider]  levothyroxine (SYNTHROID, LEVOTHROID) 125 MCG tablet Take 125 mcg by mouth daily before breakfast.   Yes [provider]  Multiple Vitamin (MULTIVITAMIN WITH MINERALS) TABS tablet Take 1 tablet by mouth daily.    Yes [provider]  omeprazole (PRILOSEC) 20 MG capsule Take 20 mg by mouth daily.    Yes [provider]  ondansetron (ZOFRAN) 4 MG tablet Take 1  tablet (4 mg total) by mouth every 8 (eight) hours as needed for nausea or vomiting. 11/05/16  Yes Aundra Dubin, PA-C  traZODone (DESYREL) 150 MG tablet Take 150 mg by mouth at bedtime as needed for sleep.   Yes [provider]  ciprofloxacin (CIPRO) 500 MG tablet Take 1 tablet (500 mg total) by mouth 2 (two) times daily for 10 days. 10/31/17 11/10/17  Merlyn Lot, MD  guaiFENesin-codeine 100-10 MG/5ML syrup Take 10 mLs by mouth 3 (three) times daily as needed. Patient not taking: Reported on 10/31/2017 10/01/17   Sherrie George B, FNP  HYDROcodone-acetaminophen (NORCO) 5-325 MG tablet Take 1 tablet by mouth every 4 (four) hours as needed for moderate pain. 10/31/17   Merlyn Lot, MD  methocarbamol (ROBAXIN) 500 MG tablet Take 1 tablet (500 mg total) by mouth 4 (four) times daily. Patient not taking: Reported on 10/31/2017 11/05/16   Aundra Dubin, PA-C  metoCLOPramide (REGLAN) 10 MG tablet Take 1 tablet (10 mg total) by mouth every 8 (eight) hours as needed for nausea. 10/31/17 10/31/18  Merlyn Lot, MD  metroNIDAZOLE (FLAGYL) 500 MG tablet Take 1 tablet (500 mg total) by mouth 2 (two) times daily for 7 days. 10/31/17 11/07/17  Merlyn Lot, MD  oxyCODONE-acetaminophen (ROXICET) 5-325 MG tablet Take 1-2 tablets by mouth every 4 (four) hours as needed. Patient not taking: Reported on 10/31/2017 11/05/16   Aundra Dubin, PA-C  predniSONE (STERAPRED UNI-PAK 21 TAB) 10 MG (21) TBPK tablet Take 6 tablets on day 1 Take 5 tablets on day 2 Take 4 tablets on day 3 Take 3 tablets on day 4 Take 2 tablets on day 5 Take 1 tablet on day 6 Patient not taking: Reported on 10/31/2017 10/01/17   Sherrie George B, FNP    Allergies Cephalexin; Morphine; Naprosyn [naproxen]; Antihistamines, diphenhydramine-type; and Diphenhydramine hcl    Social History Social History   Tobacco Use  . Smoking status: Never Smoker  . Smokeless tobacco: Never Used  . Tobacco comment: 2004 last  crack   occ alcohol  Substance Use Topics  . Alcohol use: No    Comment: 2 beers a year  . Drug use: Yes    Frequency: 7.0 times per week    Types: Marijuana    Comment: quit crack 12 years ago. marijuana use for sleep    Review of Systems Patient denies headaches, rhinorrhea, blurry vision, numbness, shortness of breath, chest pain, edema, cough, abdominal pain, nausea, vomiting, diarrhea, dysuria, fevers, rashes or hallucinations unless otherwise stated above in HPI. ____________________________________________   PHYSICAL EXAM:  VITAL SIGNS: Vitals:   10/31/17 1214 10/31/17 1302  BP: 114/62 121/75  Pulse: 69 71  Resp: 20 18  Temp:    SpO2: 97% 98%    Constitutional: Alert and oriented.  Eyes: Conjunctivae are normal.  Head: Atraumatic.  Nose: No congestion/rhinnorhea. Mouth/Throat: Mucous membranes are moist.   Neck: No stridor. Painless ROM.  Cardiovascular: Normal rate, regular rhythm. Grossly normal heart sounds.  Good peripheral circulation. Respiratory: Normal respiratory effort.  No retractions. Lungs CTAB. Gastrointestinal: Soft and nontender. No distention. No abdominal bruits. No CVA tenderness. Genitourinary: deferred Musculoskeletal: No lower extremity tenderness nor edema.  No joint effusions. Neurologic:  Normal speech and language. No gross focal neurologic deficits are appreciated. No facial droop Skin:  Skin is warm, dry and intact. No rash noted. Psychiatric: Mood and affect are normal. Speech and behavior are normal.  ____________________________________________   LABS (all labs ordered are listed, but only abnormal results are displayed)  Results for orders placed or performed during the hospital encounter of 10/31/17 (from the past 24 hour(s))  Comprehensive metabolic panel     Status: Abnormal   Collection Time: 10/31/17  6:58 AM  Result Value Ref Range   Sodium 137 135 - 145 mmol/L   Potassium 4.1 3.5 - 5.1 mmol/L   Chloride 109 101 - 111  mmol/L   CO2 19 (L) 22 - 32 mmol/L   Glucose, Bld 155 (H) 65 - 99 mg/dL   BUN 18 6 - 20 mg/dL   Creatinine, Ser 0.92 0.44 - 1.00 mg/dL   Calcium 9.4 8.9 - 10.3 mg/dL   Total Protein 7.1 6.5 - 8.1 g/dL   Albumin 4.3 3.5 - 5.0 g/dL   AST 33 15 - 41 U/L   ALT 28 14 - 54 U/L   Alkaline Phosphatase 52 38 - 126 U/L   Total Bilirubin 0.4 0.3 - 1.2 mg/dL   GFR calc non Af Amer >60 >60 mL/min   GFR calc Af Amer >60 >60 mL/min   Anion gap 9 5 - 15  CBC     Status: None   Collection Time: 10/31/17  6:58 AM  Result Value Ref Range   WBC 7.6 3.6 - 11.0 K/uL   RBC 4.50 3.80 - 5.20 MIL/uL   Hemoglobin 13.2 12.0 - 16.0 g/dL   HCT 38.6 35.0 - 47.0 %   MCV 85.9 80.0 - 100.0 fL   MCH 29.3 26.0 - 34.0 pg   MCHC 34.1 32.0 - 36.0 g/dL   RDW 14.1 11.5 - 14.5 %   Platelets 373 150 - 440 K/uL  Urinalysis, Complete w Microscopic     Status: Abnormal   Collection Time: 10/31/17  6:58 AM  Result Value Ref Range   Color, Urine YELLOW (A) YELLOW   APPearance CLEAR (A) CLEAR   Specific Gravity, Urine 1.006 1.005 - 1.030   pH 7.0 5.0 - 8.0   Glucose, UA NEGATIVE NEGATIVE mg/dL   Hgb urine dipstick SMALL (A) NEGATIVE   Bilirubin Urine NEGATIVE NEGATIVE   Ketones, ur NEGATIVE NEGATIVE mg/dL   Protein, ur NEGATIVE NEGATIVE mg/dL   Nitrite POSITIVE (A) NEGATIVE   Leukocytes, UA TRACE (A) NEGATIVE   RBC / HPF 0-5 0 - 5 RBC/hpf   WBC, UA 0-5 0 - 5 WBC/hpf   Bacteria, UA MANY (A) NONE SEEN   Squamous Epithelial / LPF 0-5 0 - 5   ____________________________________________ ____________________________________________  RADIOLOGY  I personally reviewed all radiographic images ordered to evaluate for the above acute complaints and reviewed radiology reports and findings.  These findings were personally discussed with the patient.  Please see medical record for radiology report.  ____________________________________________   PROCEDURES  Procedure(s) performed:  Procedures    Critical Care  performed: no ____________________________________________  INITIAL IMPRESSION / ASSESSMENT AND PLAN / ED COURSE  Pertinent labs & imaging results that were available during my care of the patient were reviewed by me and considered in my medical decision making (see chart for details).   DDX: Reticulitis, mass, SBO, perforation, UTI, fistula, appendicitis  KIMMERLY LORA is a 62 y.o. who presents to the ED with symptoms as described above.  Patient nontoxic-appearing is afebrile.  Blood work sent for the above differential shows no significant leukocytosis.  Based on her past medical history and presentation with significant discomfort initially CT imaging ordered to evaluate for the above differential.  CT imaging does show evidence of mild probable colitis.  No evidence of perforation or abscess.  Patient also with evidence of UTI.  Pain controlled with IV and transition to oral pain medication.  I discussed case with Dr. Dahlia Byes of general surgery who agrees with plan with for initiation of antibiotic to treat for diverticulitis given her history of this.  Cipro will also cover her UTI.  Have discussed with the patient and available family all diagnostics and treatments performed thus far and all questions were answered to the best of my ability. The patient demonstrates understanding and agreement with plan.       As part of my medical decision making, I reviewed the following data within the [place] notes reviewed and incorporated, Labs reviewed, notes from prior ED visits.   ____________________________________________   FINAL CLINICAL IMPRESSION(S) / ED DIAGNOSES  Final diagnoses:  Colitis      NEW MEDICATIONS STARTED DURING THIS VISIT:  New Prescriptions   CIPROFLOXACIN (CIPRO) 500 MG TABLET    Take 1 tablet (500 mg total) by mouth 2 (two) times daily for 10 days.   HYDROCODONE-ACETAMINOPHEN (NORCO) 5-325 MG TABLET    Take 1 tablet by mouth every 4  (four) hours as needed for moderate pain.   METOCLOPRAMIDE (REGLAN) 10 MG TABLET    Take 1 tablet (10 mg total) by mouth every 8 (eight) hours as needed for nausea.   METRONIDAZOLE (FLAGYL) 500 MG TABLET    Take 1 tablet (500 mg total) by mouth 2 (two) times daily for 7 days.     Note:  This document was prepared using Dragon voice recognition software and may include unintentional dictation errors.    Merlyn Lot, MD 10/31/17 1318

## 2017-10-31 NOTE — Discharge Instructions (Signed)

## 2017-10-31 NOTE — ED Notes (Signed)
Pt reporting 10/10 lower right quadrant abdominal pain and rectal pain that "feels like a spasm."

## 2017-11-02 ENCOUNTER — Telehealth: Payer: Self-pay | Admitting: Surgery

## 2017-11-02 NOTE — Telephone Encounter (Signed)
Left a message for the patient to call the office patient was seen in the ED. Please schedule with Dr. Dahlia Byes.

## 2017-11-02 NOTE — Telephone Encounter (Signed)
Patients coming in on 11/04/17.

## 2017-11-02 NOTE — Telephone Encounter (Signed)
-----   Message from Mickie Kay sent at 11/02/2017 11:04 AM EDT ----- Regarding: ED referral-established patient of Dr Dahlia Byes ED referral-established surgical patient with Dr Margo Aye in ED for history of diverticulitis-colitis. Schedule next time [facility] has an opening.

## 2017-11-04 ENCOUNTER — Ambulatory Visit (INDEPENDENT_AMBULATORY_CARE_PROVIDER_SITE_OTHER): Payer: 59 | Admitting: Surgery

## 2017-11-04 ENCOUNTER — Encounter: Payer: Self-pay | Admitting: Surgery

## 2017-11-04 DIAGNOSIS — K521 Toxic gastroenteritis and colitis: Secondary | ICD-10-CM

## 2017-11-04 NOTE — Patient Instructions (Signed)
Please call our office if you have questions or concerns.   

## 2017-11-04 NOTE — Progress Notes (Signed)
Outpatient Surgical Follow Up  11/04/2017  [name] [name] is an 62 y.o. female.   No chief complaint on file.   HPI: Mrs Brame known to me.  I did an open sigmoid colectomy couple of years ago.  She came into the emergency room complaining of some diarrhea alternating with constipation and abdominal pain.  CT scan was performed and I have personally reviewed this.  There is some evidence of some thickening proximal to the anastomosis.  No evidence of abscess or free air. Was placed on ciprofloxacin and Flagyl for colitis and her symptoms have resolved.  No abdominal pain.  No nausea no vomiting.  She is having bowel movements.  Past Medical History:  Diagnosis Date  . Anxiety   . DDD (degenerative disc disease), lumbar    BACK, FINGER, WRIST  . DDD (degenerative disc disease), lumbar   . Depression   . Diverticulitis   . Dysrhythmia   . GERD (gastroesophageal reflux disease)   . Headache   . Hypothyroidism   . Irregular heart rate   . Pneumonia    hx  . Sleep apnea    unable to wear mask-sleep study 3-4 yrs ago  . Spine malformation    Kiari Malformation  . Thyroid disease   . Wears dentures    UPPER    Past Surgical History:  Procedure Laterality Date  . ABDOMINAL HYSTERECTOMY  62 years old  . APPENDECTOMY  62 years old  . BACK SURGERY  1992   l4 and l5  . CARPAL TUNNEL RELEASE Bilateral 2012  . COLONOSCOPY WITH PROPOFOL N/A 08/16/2015   Procedure: COLONOSCOPY WITH PROPOFOL;  Surgeon: Lucilla Lame, MD;  Location: [facility];  Service: Endoscopy;  Laterality: N/A;  . CRANIECTOMY SUBOCCIPITAL W/ CERVICAL LAMINECTOMY / CHIARI  08  . JOINT REPLACEMENT    . LAPAROSCOPIC SIGMOID COLECTOMY N/A 09/06/2015   Procedure: open sigmoid and descending colectomy, lysis of adhesions, omenteum flap;  Surgeon: Jules Husbands, MD;  Location: [facility];  Service: General;  Laterality: N/A;  . POLYPECTOMY  08/16/2015   Procedure: POLYPECTOMY;  Surgeon: Lucilla Lame, MD;  Location: [place];  Service: Endoscopy;;  . SPINAL FUSION    . TONSILLECTOMY  as child  . TOTAL HIP ARTHROPLASTY Right 01/12/2013   Procedure: TOTAL HIP ARTHROPLASTY WITH AUTOGRAFT;  Surgeon: Ninetta Lights, MD;  Location: [place];  Service: Orthopedics;  Laterality: Right;  . TOTAL HIP ARTHROPLASTY Left 11/05/2016   Procedure: LEFT TOTAL HIP ARTHROPLASTY ANTERIOR APPROACH;  Surgeon: Ninetta Lights, MD;  Location: [place];  Service: Orthopedics;  Laterality: Left;    Family History  Problem Relation Age of Onset  . COPD Mother   . Stroke Mother   . Cancer Father     Social History:  reports that she has never smoked. She has never used smokeless tobacco. She reports that she has current or past drug history. Drug: Marijuana. Frequency: 7.00 times per week. She reports that she does not drink alcohol.  Allergies:  Allergies  Allergen Reactions  . Cephalexin Swelling    Facial Swelling  . Morphine     REACTION: Bradycardia, Hypotension  . Naprosyn [Naproxen] Swelling    Facial swelling  . Antihistamines, Diphenhydramine-Type Palpitations  . Diphenhydramine Hcl Palpitations    Medications reviewed.    ROS Full ROS performed and is otherwise negative other than what is stated in HPI   There were no vitals taken for this visit.  Physical Exam  Constitutional: She appears well-developed and well-nourished. No distress.  Neck: Normal range of motion. No JVD present. No tracheal deviation present. No thyromegaly present.  Pulmonary/Chest: Effort normal. No stridor. No respiratory distress.  Abdominal: Soft. She exhibits no distension and no mass. There is no tenderness. There is no rebound and no guarding.  Skin: Skin is warm. Capillary refill takes less than 2 seconds.  Psychiatric: She has a normal mood and affect. Her behavior is normal. Judgment and thought content normal.  Nursing note and vitals reviewed.   Assessment/Plan:  Is a history of colitis now resolving.  No need  for surgical intervention.  Encouraged her for a high-fiber diet. F/U PRN Greater than 50% of the 25 minutes  visit was spent in counseling/coordination of care   Caroleen Hamman, MD [facility] Surgeon

## 2018-03-11 DIAGNOSIS — E039 Hypothyroidism, unspecified: Secondary | ICD-10-CM | POA: Diagnosis not present

## 2018-03-11 DIAGNOSIS — K219 Gastro-esophageal reflux disease without esophagitis: Secondary | ICD-10-CM | POA: Diagnosis not present

## 2018-03-11 DIAGNOSIS — E785 Hyperlipidemia, unspecified: Secondary | ICD-10-CM | POA: Diagnosis not present

## 2018-03-31 DIAGNOSIS — E039 Hypothyroidism, unspecified: Secondary | ICD-10-CM | POA: Diagnosis not present

## 2018-03-31 DIAGNOSIS — E785 Hyperlipidemia, unspecified: Secondary | ICD-10-CM | POA: Diagnosis not present

## 2018-04-01 DIAGNOSIS — S199XXA Unspecified injury of neck, initial encounter: Secondary | ICD-10-CM | POA: Diagnosis not present

## 2018-04-01 DIAGNOSIS — M542 Cervicalgia: Secondary | ICD-10-CM | POA: Diagnosis not present

## 2018-06-14 ENCOUNTER — Encounter (HOSPITAL_COMMUNITY): Payer: Self-pay | Admitting: Emergency Medicine

## 2018-06-14 ENCOUNTER — Emergency Department (HOSPITAL_COMMUNITY): Payer: Managed Care, Other (non HMO)

## 2018-06-14 ENCOUNTER — Emergency Department (HOSPITAL_COMMUNITY)
Admission: EM | Admit: 2018-06-14 | Discharge: 2018-06-14 | Disposition: A | Discharge status: Home / Self Care | Payer: Managed Care, Other (non HMO) | Attending: Emergency Medicine | Admitting: Emergency Medicine

## 2018-06-14 DIAGNOSIS — M5441 Lumbago with sciatica, right side: Secondary | ICD-10-CM | POA: Insufficient documentation

## 2018-06-14 DIAGNOSIS — Z96643 Presence of artificial hip joint, bilateral: Secondary | ICD-10-CM | POA: Diagnosis not present

## 2018-06-14 DIAGNOSIS — M5442 Lumbago with sciatica, left side: Secondary | ICD-10-CM | POA: Insufficient documentation

## 2018-06-14 DIAGNOSIS — Z79899 Other long term (current) drug therapy: Secondary | ICD-10-CM | POA: Insufficient documentation

## 2018-06-14 DIAGNOSIS — E039 Hypothyroidism, unspecified: Secondary | ICD-10-CM | POA: Insufficient documentation

## 2018-06-14 DIAGNOSIS — M545 Low back pain: Secondary | ICD-10-CM | POA: Diagnosis present

## 2018-06-14 MED ORDER — ONDANSETRON 4 MG PO TBDP
4.0000 mg | ORAL_TABLET | Freq: Once | ORAL | Status: AC
  Administered 2018-06-14: 4 mg via ORAL
  Filled 2018-06-14: qty 1

## 2018-06-14 MED ORDER — HYDROCODONE-ACETAMINOPHEN 5-325 MG PO TABS
1.0000 | ORAL_TABLET | Freq: Once | ORAL | Status: AC
  Administered 2018-06-14: 1 via ORAL
  Filled 2018-06-14: qty 1

## 2018-06-14 MED ORDER — HYDROCODONE-ACETAMINOPHEN 5-325 MG PO TABS: 1.0000 | ORAL_TABLET | Freq: Two times a day (BID) | ORAL | 0 refills | Status: DC | PRN

## 2018-06-14 NOTE — Discharge Instructions (Addendum)
Prescription given for Norco. Take medication as directed and do not operate machinery, drive a car, or work while taking this medication as it can make you drowsy.     Return to the emergency department immediately if you experience any back pain associated with fevers, loss of control of your bowels/bladder, weakness/numbness to your legs, numbness to your groin area, inability to walk, or inability to urinate.

## 2018-06-14 NOTE — ED Triage Notes (Signed)
Pt reports yesterday pulled her lower back out and having pains down both legs.

## 2018-06-14 NOTE — ED Notes (Signed)
Patient assisted to restroom with wheelchair.

## 2018-06-14 NOTE — ED Notes (Signed)
Patient ambulatory in hallway with stand by assistance. C/o pain during ambulation.

## 2018-06-14 NOTE — ED Provider Notes (Signed)
[facility] DEPT Provider Note   CSN: [id number] Arrival date & time: 06/14/18  1049     History   Chief Complaint Chief Complaint  Patient presents with  . Back Pain    HPI [name] [name] is a 63 y.o. female.  HPI  Patient is a 63 year old female with a history of anxiety, DDD of the lumbar spine s/p spine surgery, GERD, thyroid disease, who presents the emergency department today for evaluation of low back pain that began yesterday.  Patient states that yesterday after eating breakfast she stood up from her chair and had sudden onset of back pain.  Rates pain 15/10.  States pain "feels like I am being pulled apart ".  She intermittently has sharp pain radiating down her bilateral legs.  Pain radiates more so on the left versus the right.  She denies numbness or weakness.  No saddle anesthesia.  No loss control of bowel or bladder function or urinary retention.  No fevers.  No history of IV drug use or cancer.  She states she is been ambulatory at home.  She tried taking a muscle relaxer at night which improved her symptoms and help her sleep however today she woke up and her symptoms were persistent.  She attempted to go to work the pain was too severe to stay.  No chest pain, shortness of breath, abdominal pain or urinary symptoms.  Past Medical History:  Diagnosis Date  . Anxiety   . DDD (degenerative disc disease), lumbar    BACK, FINGER, WRIST  . DDD (degenerative disc disease), lumbar   . Depression   . Diverticulitis   . Dysrhythmia   . GERD (gastroesophageal reflux disease)   . Headache   . Hypothyroidism   . Irregular heart rate   . Pneumonia    hx  . Sleep apnea    unable to wear mask-sleep study 3-4 yrs ago  . Spine malformation    Kiari Malformation  . Thyroid disease   . Wears dentures    UPPER    Patient Active Problem List   Diagnosis Date Noted  . Primary localized osteoarthritis of left hip 11/05/2016  . Generalized  abdominal pain 01/08/2015  . Obesity 01/08/2015  . Dizziness 01/08/2015  . HYPOTHYROIDISM 11/07/2009  . CALLUSES, LEFT FOOT 11/07/2009  . FASCIITIS, [place] 11/07/2009    Past Surgical History:  Procedure Laterality Date  . ABDOMINAL HYSTERECTOMY  63 years old  . APPENDECTOMY  63 years old  . BACK SURGERY  1992   l4 and l5  . CARPAL TUNNEL RELEASE Bilateral 2012  . COLONOSCOPY WITH PROPOFOL N/A 08/16/2015   Procedure: COLONOSCOPY WITH PROPOFOL;  Surgeon: Lucilla Lame, MD;  Location: [facility];  Service: Endoscopy;  Laterality: N/A;  . CRANIECTOMY SUBOCCIPITAL W/ CERVICAL LAMINECTOMY / CHIARI  08  . JOINT REPLACEMENT    . LAPAROSCOPIC SIGMOID COLECTOMY N/A 09/06/2015   Procedure: open sigmoid and descending colectomy, lysis of adhesions, omenteum flap;  Surgeon: Jules Husbands, MD;  Location: [facility];  Service: General;  Laterality: N/A;  . POLYPECTOMY  08/16/2015   Procedure: POLYPECTOMY;  Surgeon: Lucilla Lame, MD;  Location: [facility];  Service: Endoscopy;;  . SPINAL FUSION    . TONSILLECTOMY  as child  . TOTAL HIP ARTHROPLASTY Right 01/12/2013   Procedure: TOTAL HIP ARTHROPLASTY WITH AUTOGRAFT;  Surgeon: Ninetta Lights, MD;  Location: [facility];  Service: Orthopedics;  Laterality: Right;  . TOTAL HIP ARTHROPLASTY Left 11/05/2016  Procedure: LEFT TOTAL HIP ARTHROPLASTY ANTERIOR APPROACH;  Surgeon: Ninetta Lights, MD;  Location: [facility];  Service: Orthopedics;  Laterality: Left;     OB History   No obstetric history on file.      Home Medications    Prior to Admission medications   Medication Sig Start Date End Date Taking? Authorizing Provider  ARIPiprazole (ABILIFY) 5 MG tablet Take 5 mg by mouth daily. 06/09/18  Yes [provider]  Aspirin-Salicylamide-Caffeine (BC HEADACHE POWDER PO) Take 1 packet by mouth as needed (headache).   Yes [provider]  DULoxetine (CYMBALTA) 60 MG capsule Take 120 mg by mouth daily.  05/15/15  Yes [provider]  fenofibrate 160 MG tablet Take 160 mg by mouth daily.    Yes [provider]  levothyroxine (SYNTHROID, LEVOTHROID) 137 MCG tablet Take 137 mcg by mouth daily. 04/26/18  Yes [provider]  methocarbamol (ROBAXIN) 500 MG tablet Take 500 mg by mouth every 6 (six) hours as needed for muscle spasms.   Yes [provider]  aspirin EC 325 MG tablet Take 1 tablet (325 mg total) by mouth daily. 1 tab a day for the next 30 days to prevent blood clots Patient not taking: Reported on 06/14/2018 11/05/16   Aundra Dubin, PA-C  guaiFENesin-codeine 100-10 MG/5ML syrup Take 10 mLs by mouth 3 (three) times daily as needed. Patient not taking: Reported on 10/31/2017 10/01/17   Sherrie George B, FNP  HYDROcodone-acetaminophen (NORCO/VICODIN) 5-325 MG tablet Take 1 tablet by mouth every 12 (twelve) hours as needed. 06/14/18   Zula Hovsepian S, PA-C  methocarbamol (ROBAXIN) 500 MG tablet Take 1 tablet (500 mg total) by mouth 4 (four) times daily. Patient not taking: Reported on 10/31/2017 11/05/16   Aundra Dubin, PA-C  metoCLOPramide (REGLAN) 10 MG tablet Take 1 tablet (10 mg total) by mouth every 8 (eight) hours as needed for nausea. Patient not taking: Reported on 06/14/2018 10/31/17 10/31/18  Merlyn Lot, MD  ondansetron (ZOFRAN) 4 MG tablet Take 1 tablet (4 mg total) by mouth every 8 (eight) hours as needed for nausea or vomiting. Patient not taking: Reported on 06/14/2018 11/05/16   Aundra Dubin, PA-C  oxyCODONE-acetaminophen (ROXICET) 5-325 MG tablet Take 1-2 tablets by mouth every 4 (four) hours as needed. Patient not taking: Reported on 10/31/2017 11/05/16   Aundra Dubin, PA-C  predniSONE (STERAPRED UNI-PAK 21 TAB) 10 MG (21) TBPK tablet Take 6 tablets on day 1 Take 5 tablets on day 2 Take 4 tablets on day 3 Take 3 tablets on day 4 Take 2 tablets on day 5 Take 1 tablet on day 6 Patient not taking: Reported on 10/31/2017 10/01/17   Victorino Dike, FNP     Family History Family History  Problem Relation Age of Onset  . COPD Mother   . Stroke Mother   . Cancer Father     Social History Social History   Tobacco Use  . Smoking status: Never Smoker  . Smokeless tobacco: Never Used  . Tobacco comment: 2004 last crack   occ alcohol  Substance Use Topics  . Alcohol use: No    Comment: 2 beers a year  . Drug use: Yes    Frequency: 7.0 times per week    Types: Marijuana    Comment: quit crack 12 years ago. marijuana use for sleep     Allergies   Cephalexin; Morphine; Naprosyn [naproxen]; Antihistamines, diphenhydramine-type; and Diphenhydramine hcl   Review of Systems  Review of Systems  Constitutional: Negative for fever.  Respiratory: Negative for shortness of breath.   Cardiovascular: Negative for chest pain.  Gastrointestinal: Negative for abdominal pain, blood in stool, constipation, diarrhea, nausea and vomiting.  Genitourinary: Negative for dysuria, flank pain, frequency, hematuria and urgency.  Musculoskeletal: Positive for back pain. Negative for gait problem.  Skin: Negative for wound.  Neurological: Negative for weakness and numbness.     Physical Exam Updated Vital Signs BP (!) 137/91   Pulse 68   Temp (!) 97.5 F (36.4 C) (Oral)   Resp 19   SpO2 100%   Physical Exam Vitals signs and nursing note reviewed.  Constitutional:      General: She is not in acute distress.    Appearance: She is well-developed.  HENT:     Head: Normocephalic and atraumatic.  Eyes:     Conjunctiva/sclera: Conjunctivae normal.  Neck:     Musculoskeletal: Neck supple.  Cardiovascular:     Rate and Rhythm: Normal rate.     Heart sounds: Normal heart sounds. No murmur.  Pulmonary:     Effort: Pulmonary effort is normal. No respiratory distress.     Breath sounds: Normal breath sounds.  Abdominal:     Palpations: Abdomen is soft.     Tenderness: There is no abdominal tenderness.  Musculoskeletal: Normal range of motion.      Comments: TTP over the lumbar spine and over the prior surgical scar which appears well-healed.  Also with tenderness to the bilateral lumbar paraspinous muscles that reproduces her symptoms.  She has 4/5 strength to the bilateral lower extremities, likely secondary to pain.  She has normal sensation bilaterally.  She is able to ambulate with steady gait however has pain with this.  Skin:    General: Skin is warm and dry.  Neurological:     Mental Status: She is alert.     Sensory: No sensory deficit.     Coordination: Coordination normal.     Gait: Gait normal.  Psychiatric:        Mood and Affect: Mood normal.      ED Treatments / Results  Labs (all labs ordered are listed, but only abnormal results are displayed) Labs Reviewed - No data to display  EKG None  Radiology Dg Lumbar Spine Complete  Result Date: 06/14/2018 CLINICAL DATA:  Low back strain with pain radiating into both legs today. History prior lumbar surgery. EXAM: LUMBAR SPINE - COMPLETE 4+ VIEW COMPARISON:  CT abdomen and pelvis 10/31/2017. FINDINGS: There is mild convex right curvature. Vertebral body height is maintained. The patient is status post L4-5 fusion. Loss of disc space height at L2-3, L3-4 and L5-S1 appears unchanged. Surgical clips in the left abdomen and right hip replacement are noted. IMPRESSION: No acute abnormality. No change in L2-3, L3-4 and L5-S1 degenerative disc disease. Status post L4-5 PLIF. Electronically Signed   By: Inge Rise M.D.   On: 06/14/2018 14:05    Procedures Procedures (including critical care time)  Medications Ordered in ED Medications  HYDROcodone-acetaminophen (NORCO/VICODIN) 5-325 MG per tablet 1 tablet (1 tablet Oral Given 06/14/18 1255)  HYDROcodone-acetaminophen (NORCO/VICODIN) 5-325 MG per tablet 1 tablet (1 tablet Oral Given 06/14/18 1414)  ondansetron (ZOFRAN-ODT) disintegrating tablet 4 mg (4 mg Oral Given 06/14/18 1414)     Initial Impression / Assessment  and Plan / ED Course  I have reviewed the triage vital signs and the nursing notes.  Pertinent labs & imaging results that were available during  my care of the patient were reviewed by me and considered in my medical decision making (see chart for details).  Reviewed [employer] narcotic database and there are no red flags.  Final Clinical Impressions(s) / ED Diagnoses   Final diagnoses:  Low back pain with bilateral sciatica, unspecified back pain laterality, unspecified chronicity   Patient with back pain.  No neurological deficits and normal neuro exam.  Patient can walk but states is painful.  No loss of bowel or bladder control.  No concern for cauda equina.  No fever, night sweats, weight loss, h/o cancer, IVDU.  Pt given pain medications in the ED and was able to ambulate. States that her pain has improved. Xray lumbar spine with no acute findings, hardware stable. RICE protocol and pain medicine indicated and discussed with patient.  Advised to follow-up with her neurosurgeon, Dr. Sherwood Gambler.  She agrees to call the office to make a follow-up appointment.  Advised return to the ER for new or worsening symptoms.  She voiced understanding the plan and reasons return.  All questions answered.   ED Discharge Orders         Ordered    HYDROcodone-acetaminophen (NORCO/VICODIN) 5-325 MG tablet  Every 12 hours PRN     06/14/18 1456           Park Beck S, PA-C 06/14/18 1458    Sherwood Gambler, MD 06/15/18 [phone]

## 2018-06-21 ENCOUNTER — Other Ambulatory Visit: Payer: Self-pay | Admitting: Family Medicine

## 2018-06-21 DIAGNOSIS — M5432 Sciatica, left side: Secondary | ICD-10-CM

## 2018-06-27 ENCOUNTER — Ambulatory Visit
Admission: RE | Admit: 2018-06-27 | Discharge: 2018-06-27 | Disposition: A | Discharge status: Home / Self Care | Payer: Managed Care, Other (non HMO) | Source: Ambulatory Visit | Attending: Family Medicine | Admitting: Family Medicine

## 2018-06-27 DIAGNOSIS — M5432 Sciatica, left side: Secondary | ICD-10-CM

## 2019-06-16 IMAGING — CT CT ABD-PELV W/ CM
2 of 5 series · 16 of 46 positions shown, 18 images · IV contrast (APPLIED)
Comparison: CT abdomen pelvis dated July 18, 2015.

CLINICAL DATA: Intermittent sharp pain in the lower abdomen and
rectum. Prior sigmoid colectomy for diverticulitis.

EXAM:
CT ABDOMEN AND PELVIS WITH CONTRAST
TECHNIQUE: Multidetector CT imaging of the abdomen and pelvis was performed
using the standard protocol following bolus administration of
intravenous contrast.
CONTRAST:  100mL 3YDHJV-I55 IOPAMIDOL (3YDHJV-I55) INJECTION 61%

[Series 5: coronal st · coronal · 0.86mm/px · 3 of 81 slices shown]
[im 27/81  soft-tissue]
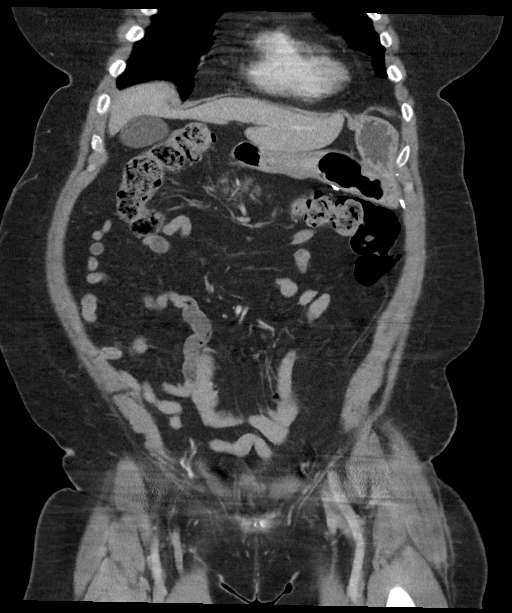
[im 36/81  soft-tissue]
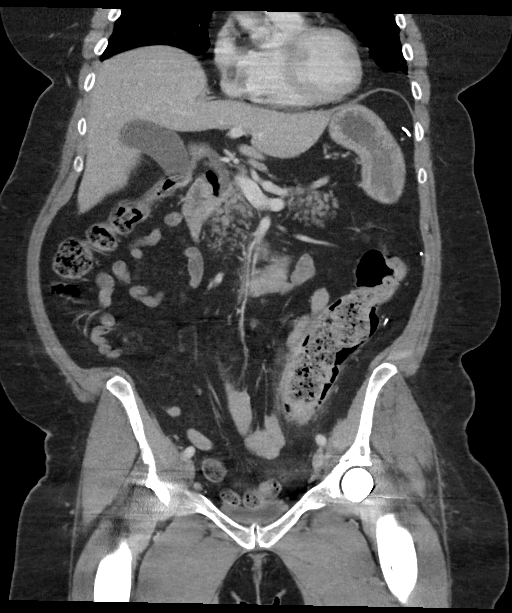
[im 45/81  soft-tissue]
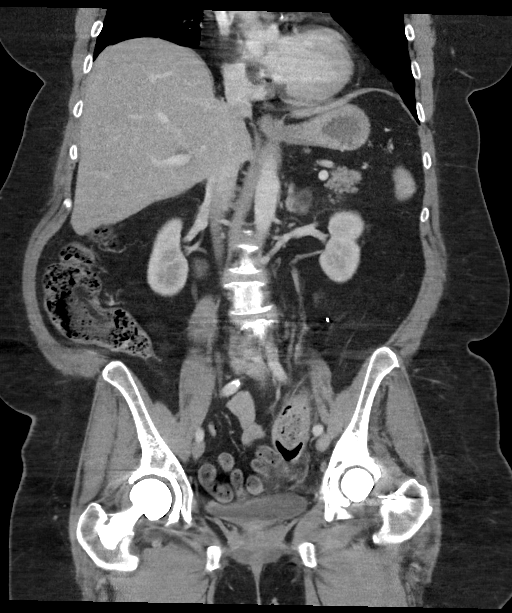

[Series 7: routine abd/pel with (person_name) · axial · 0.85mm/px · z∈[-634,-179]mm · 13 of 103 slices shown, 15 images]
[im 6/103  soft-tissue]
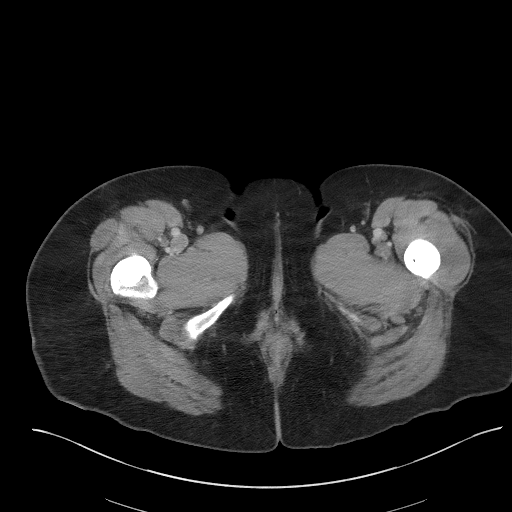
[im 6/103  bone]
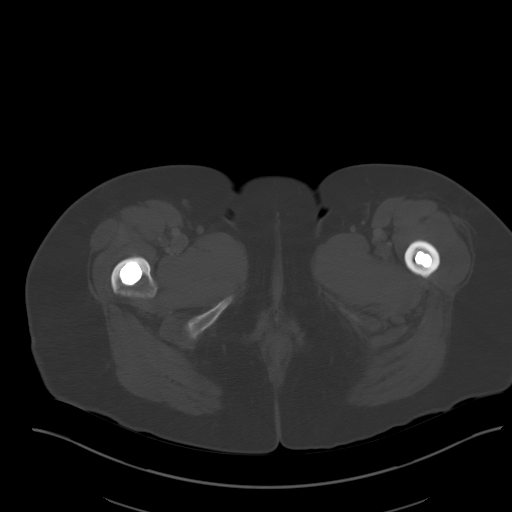
[im 12/103  soft-tissue]
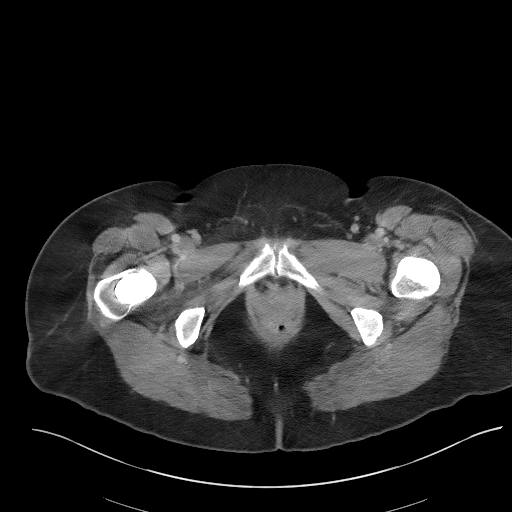
[im 23/103  soft-tissue]
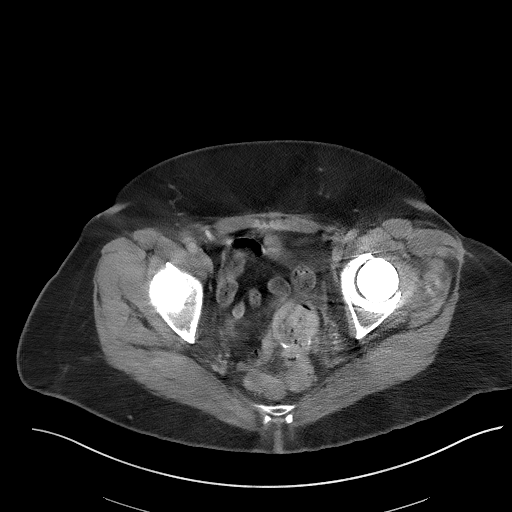
[im 29/103  soft-tissue]
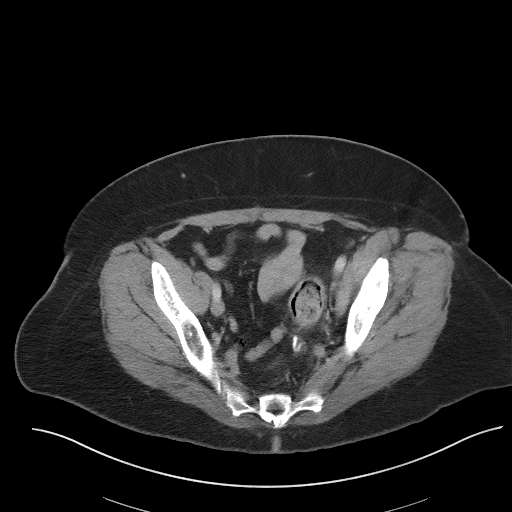
[im 35/103  soft-tissue]
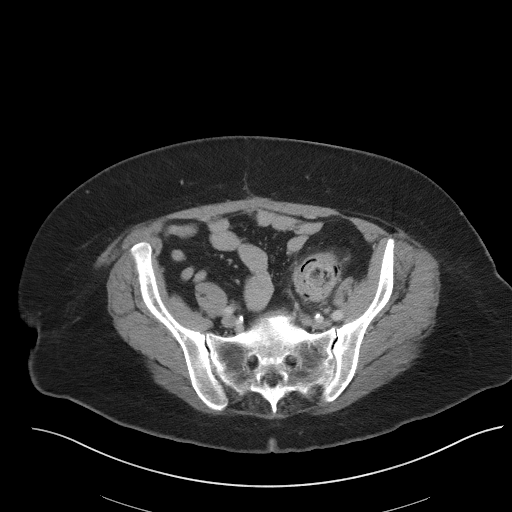
[im 46/103  soft-tissue]
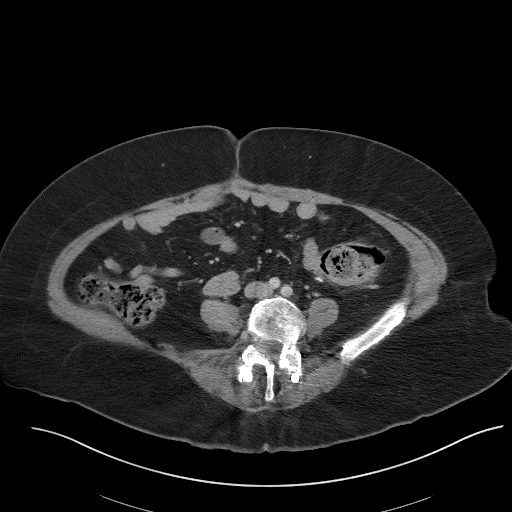
[im 52/103  soft-tissue]
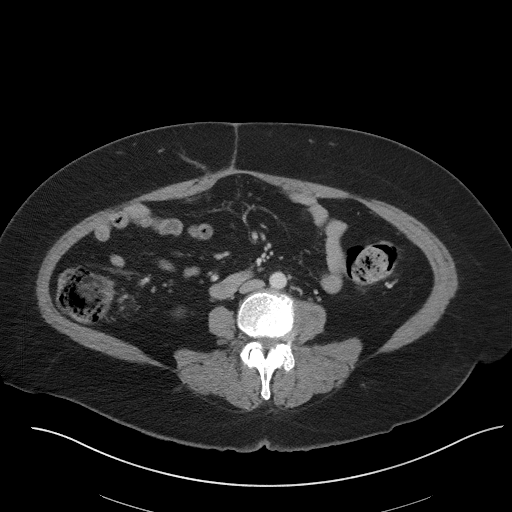
[im 57/103  soft-tissue]
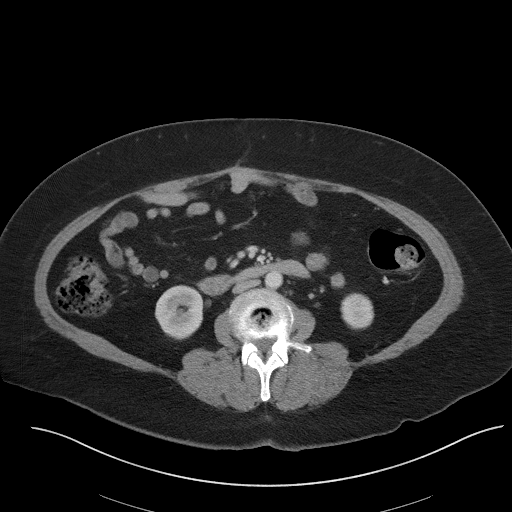
[im 69/103  soft-tissue]
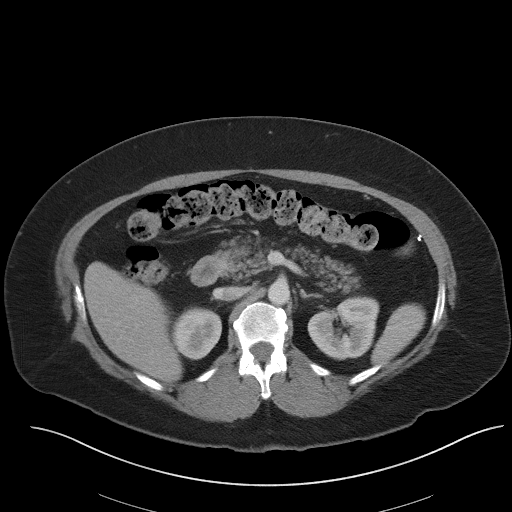
[im 69/103  bone]
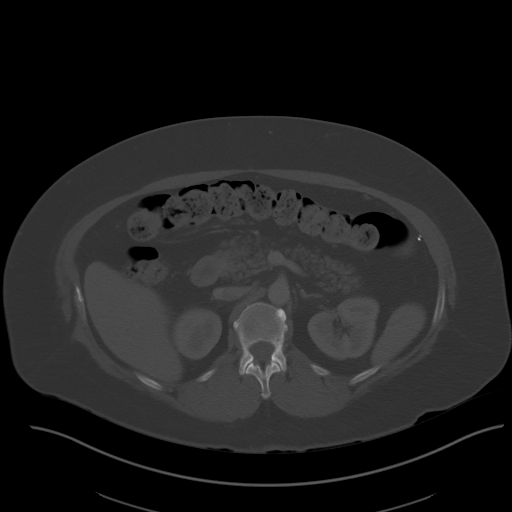
[im 74/103  soft-tissue]
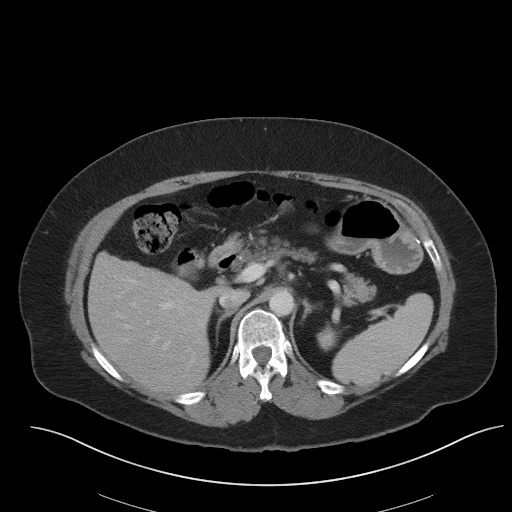
[im 80/103  soft-tissue]
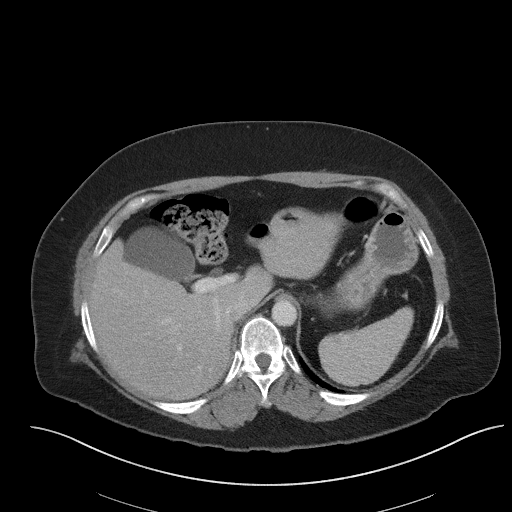
[im 91/103  soft-tissue]
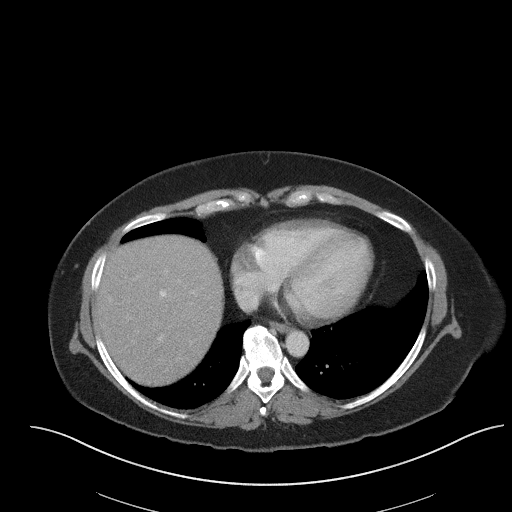
[im 97/103  soft-tissue]
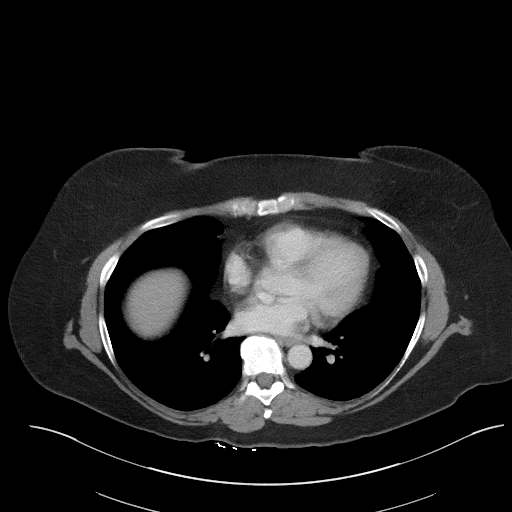

[16 of 46 positions shown; findings below may reference images not displayed]

FINDINGS: Lower chest: No acute abnormality.

Hepatobiliary: No focal liver abnormality is seen. No gallstones,
gallbladder wall thickening, or biliary dilatation.

Pancreas: Mild fatty atrophy. No ductal dilatation or surrounding
inflammatory changes.

Spleen: Normal in size without focal abnormality.

Adrenals/Urinary Tract: Adrenal glands are unremarkable. Kidneys are
normal, without renal calculi, focal lesion, or hydronephrosis.
Bladder is unremarkable.

Stomach/Bowel: The stomach is within normal limits. Postsurgical
changes related to prior sigmoid colectomy. There is mild wall
thickening and inflammatory stranding surrounding the distal
descending colon just proximal to the anastomosis. Mild colonic
stool burden. No obstruction. The appendix is surgically absent.

Vascular/Lymphatic: No significant vascular findings are present. No
enlarged abdominal or pelvic lymph nodes.

Reproductive: Status post hysterectomy. No adnexal masses.

Other: No abdominal wall hernia or abnormality. No abdominopelvic
ascites. No pneumoperitoneum.

Musculoskeletal: No acute or significant osseous findings. Prior
bilateral hip arthroplasties and lower lumbar fusion.
IMPRESSION: 1. Postsurgical changes related to prior sigmoid colectomy with mild
wall thickening and inflammatory stranding surrounding the distal
descending colon proximal to the anastomosis. Findings are
nonspecific and could be infectious or inflammatory in etiology.

## 2019-08-25 DIAGNOSIS — M48061 Spinal stenosis, lumbar region without neurogenic claudication: Secondary | ICD-10-CM | POA: Diagnosis not present

## 2019-08-25 DIAGNOSIS — M5416 Radiculopathy, lumbar region: Secondary | ICD-10-CM | POA: Diagnosis not present

## 2019-08-26 DIAGNOSIS — M47816 Spondylosis without myelopathy or radiculopathy, lumbar region: Secondary | ICD-10-CM | POA: Diagnosis not present

## 2019-08-26 DIAGNOSIS — Z981 Arthrodesis status: Secondary | ICD-10-CM | POA: Diagnosis not present

## 2019-08-26 DIAGNOSIS — M5136 Other intervertebral disc degeneration, lumbar region: Secondary | ICD-10-CM | POA: Diagnosis not present

## 2019-08-26 DIAGNOSIS — M545 Low back pain: Secondary | ICD-10-CM | POA: Diagnosis not present

## 2019-09-01 DIAGNOSIS — M48061 Spinal stenosis, lumbar region without neurogenic claudication: Secondary | ICD-10-CM | POA: Diagnosis not present

## 2019-09-01 DIAGNOSIS — M4726 Other spondylosis with radiculopathy, lumbar region: Secondary | ICD-10-CM | POA: Diagnosis not present

## 2019-09-01 DIAGNOSIS — M5136 Other intervertebral disc degeneration, lumbar region: Secondary | ICD-10-CM | POA: Diagnosis not present

## 2020-01-03 DIAGNOSIS — S39012A Strain of muscle, fascia and tendon of lower back, initial encounter: Secondary | ICD-10-CM | POA: Diagnosis not present

## 2020-01-03 DIAGNOSIS — S8002XA Contusion of left knee, initial encounter: Secondary | ICD-10-CM | POA: Diagnosis not present

## 2020-02-29 DIAGNOSIS — R0982 Postnasal drip: Secondary | ICD-10-CM | POA: Diagnosis not present

## 2020-02-29 DIAGNOSIS — R42 Dizziness and giddiness: Secondary | ICD-10-CM | POA: Diagnosis not present

## 2020-02-29 DIAGNOSIS — R739 Hyperglycemia, unspecified: Secondary | ICD-10-CM | POA: Diagnosis not present

## 2020-03-29 ENCOUNTER — Encounter: Payer: Self-pay | Admitting: Neurology

## 2020-04-26 DIAGNOSIS — Z20822 Contact with and (suspected) exposure to covid-19: Secondary | ICD-10-CM | POA: Diagnosis not present

## 2020-06-11 ENCOUNTER — Ambulatory Visit: Payer: Managed Care, Other (non HMO) | Admitting: Neurology

## 2020-09-06 DIAGNOSIS — K219 Gastro-esophageal reflux disease without esophagitis: Secondary | ICD-10-CM | POA: Diagnosis not present

## 2020-09-06 DIAGNOSIS — F324 Major depressive disorder, single episode, in partial remission: Secondary | ICD-10-CM | POA: Diagnosis not present

## 2020-09-06 DIAGNOSIS — E039 Hypothyroidism, unspecified: Secondary | ICD-10-CM | POA: Diagnosis not present

## 2020-09-06 DIAGNOSIS — E785 Hyperlipidemia, unspecified: Secondary | ICD-10-CM | POA: Diagnosis not present

## 2020-09-10 DIAGNOSIS — R7309 Other abnormal glucose: Secondary | ICD-10-CM | POA: Diagnosis not present

## 2020-09-10 DIAGNOSIS — E039 Hypothyroidism, unspecified: Secondary | ICD-10-CM | POA: Diagnosis not present

## 2020-09-10 DIAGNOSIS — E785 Hyperlipidemia, unspecified: Secondary | ICD-10-CM | POA: Diagnosis not present

## 2020-10-17 DIAGNOSIS — F419 Anxiety disorder, unspecified: Secondary | ICD-10-CM | POA: Diagnosis not present

## 2020-10-29 ENCOUNTER — Emergency Department: Payer: BC Managed Care – PPO

## 2020-10-29 ENCOUNTER — Emergency Department
Admission: EM | Admit: 2020-10-29 | Discharge: 2020-10-29 | Disposition: A | Discharge status: Home / Self Care | Payer: BC Managed Care – PPO | Attending: Emergency Medicine | Admitting: Emergency Medicine

## 2020-10-29 ENCOUNTER — Encounter: Payer: Self-pay | Admitting: Emergency Medicine

## 2020-10-29 ENCOUNTER — Other Ambulatory Visit: Payer: Self-pay

## 2020-10-29 DIAGNOSIS — Z96643 Presence of artificial hip joint, bilateral: Secondary | ICD-10-CM | POA: Diagnosis not present

## 2020-10-29 DIAGNOSIS — Z96642 Presence of left artificial hip joint: Secondary | ICD-10-CM | POA: Diagnosis not present

## 2020-10-29 DIAGNOSIS — Z7982 Long term (current) use of aspirin: Secondary | ICD-10-CM | POA: Insufficient documentation

## 2020-10-29 DIAGNOSIS — R1030 Lower abdominal pain, unspecified: Secondary | ICD-10-CM | POA: Insufficient documentation

## 2020-10-29 DIAGNOSIS — M25552 Pain in left hip: Secondary | ICD-10-CM | POA: Diagnosis not present

## 2020-10-29 DIAGNOSIS — E039 Hypothyroidism, unspecified: Secondary | ICD-10-CM | POA: Insufficient documentation

## 2020-10-29 DIAGNOSIS — Z79899 Other long term (current) drug therapy: Secondary | ICD-10-CM | POA: Diagnosis not present

## 2020-10-29 DIAGNOSIS — M79652 Pain in left thigh: Secondary | ICD-10-CM | POA: Diagnosis not present

## 2020-10-29 DIAGNOSIS — M545 Low back pain, unspecified: Secondary | ICD-10-CM | POA: Diagnosis not present

## 2020-10-29 LAB — CBC
HCT: 38.1 % (ref 36.0–46.0)
Hemoglobin: 12.9 g/dL (ref 12.0–15.0)
MCH: 30.5 pg (ref 26.0–34.0)
MCHC: 33.9 g/dL (ref 30.0–36.0)
MCV: 90.1 fL (ref 80.0–100.0)
Platelets: 311 10*3/uL (ref 150–400)
RBC: 4.23 MIL/uL (ref 3.87–5.11)
RDW: 12.4 % (ref 11.5–15.5)
WBC: 9 10*3/uL (ref 4.0–10.5)
nRBC: 0 % (ref 0.0–0.2)

## 2020-10-29 LAB — BASIC METABOLIC PANEL
Anion gap: 8 (ref 5–15)
BUN: 22 mg/dL (ref 8–23)
CO2: 24 mmol/L (ref 22–32)
Calcium: 9.5 mg/dL (ref 8.9–10.3)
Chloride: 106 mmol/L (ref 98–111)
Creatinine, Ser: 1.08 mg/dL — ABNORMAL HIGH (ref 0.44–1.00)
GFR, Estimated: 58 mL/min — ABNORMAL LOW (ref >60)
Glucose, Bld: 88 mg/dL (ref 70–99)
Potassium: 3.9 mmol/L (ref 3.5–5.1)
Sodium: 138 mmol/L (ref 135–145)

## 2020-10-29 LAB — URINALYSIS, COMPLETE (UACMP) WITH MICROSCOPIC
Bilirubin Urine: NEGATIVE
Glucose, UA: NEGATIVE mg/dL
Hgb urine dipstick: NEGATIVE
Ketones, ur: NEGATIVE mg/dL
Nitrite: POSITIVE — AB
Protein, ur: NEGATIVE mg/dL
Specific Gravity, Urine: 1.006 (ref 1.005–1.030)
pH: 7 (ref 5.0–8.0)

## 2020-10-29 MED ORDER — OXYCODONE HCL 5 MG PO TABS: 5.0000 mg | ORAL_TABLET | Freq: Four times a day (QID) | ORAL | 0 refills | Status: AC | PRN

## 2020-10-29 MED ORDER — PREDNISONE 10 MG PO TABS: 10.0000 mg | ORAL_TABLET | Freq: Every day | ORAL | 0 refills | Status: DC

## 2020-10-29 MED ORDER — OXYCODONE-ACETAMINOPHEN 5-325 MG PO TABS
1.0000 | ORAL_TABLET | Freq: Once | ORAL | Status: AC
  Administered 2020-10-29: 1 via ORAL
  Filled 2020-10-29: qty 1

## 2020-10-29 NOTE — ED Triage Notes (Signed)
STates has had neck and back surgery as well as hip replacement.  C?O back and hip pain since Saturday. States took last flexeril, which helped but then the pain came back.  AAOx3.  Skin warm and dry. NAd

## 2020-10-29 NOTE — ED Provider Notes (Signed)
[facility] Provider Note   CSN: [id number] Arrival date & time: 10/29/20  1524     History Chief Complaint  Patient presents with   Back Pain    [name] [name] is a 65 y.o. female presents to the Emergency Department for evaluation of left lower back pain, left hip pain.  She has a history of total hip arthroplasty performed 4 years ago.  She states 1 day ago she was getting out of bed and developed some pain in her left lower back, left buttocks, lateral hip and left groin.  She describes a deep aching pain worse with weightbearing activity and movement of the left hip.  No numbness tingling or radicular symptoms.  She has been taking tizanidine several times a day with some relief.  She denies any fevers, warmth, redness, drainage.  No urinary symptoms, abdominal pain, nausea vomiting or chills.  HPI     Past Medical History:  Diagnosis Date   Anxiety    DDD (degenerative disc disease), lumbar    BACK, FINGER, WRIST   DDD (degenerative disc disease), lumbar    Depression    Diverticulitis    Dysrhythmia    GERD (gastroesophageal reflux disease)    Headache    Hypothyroidism    Irregular heart rate    Pneumonia    hx   Sleep apnea    unable to wear mask-sleep study 3-4 yrs ago   Spine malformation    Kiari Malformation   Thyroid disease    Wears dentures    UPPER    Patient Active Problem List   Diagnosis Date Noted   Primary localized osteoarthritis of left hip 11/05/2016   Generalized abdominal pain 01/08/2015   Obesity 01/08/2015   Dizziness 01/08/2015   HYPOTHYROIDISM 11/07/2009   CALLUSES, LEFT FOOT 11/07/2009   FASCIITIS, PLANTAR 11/07/2009    Past Surgical History:  Procedure Laterality Date   ABDOMINAL HYSTERECTOMY  65 years old   APPENDECTOMY  65 years old   [place]   l50 and l5   CARPAL TUNNEL RELEASE Bilateral 2012   COLONOSCOPY WITH PROPOFOL N/A 08/16/2015   Procedure: COLONOSCOPY WITH  PROPOFOL;  Surgeon: Lucilla Lame, MD;  Location: [facility];  Service: Endoscopy;  Laterality: N/A;   CRANIECTOMY SUBOCCIPITAL W/ CERVICAL LAMINECTOMY / CHIARI  08   JOINT REPLACEMENT     LAPAROSCOPIC SIGMOID COLECTOMY N/A 09/06/2015   Procedure: open sigmoid and descending colectomy, lysis of adhesions, omenteum flap;  Surgeon: Jules Husbands, MD;  Location: [facility];  Service: General;  Laterality: N/A;   POLYPECTOMY  08/16/2015   Procedure: POLYPECTOMY;  Surgeon: Lucilla Lame, MD;  Location: [facility];  Service: Endoscopy;;   SPINAL FUSION     TONSILLECTOMY  as child   TOTAL HIP ARTHROPLASTY Right 01/12/2013   Procedure: TOTAL HIP ARTHROPLASTY WITH AUTOGRAFT;  Surgeon: Ninetta Lights, MD;  Location: [facility];  Service: Orthopedics;  Laterality: Right;   TOTAL HIP ARTHROPLASTY Left 11/05/2016   Procedure: LEFT TOTAL HIP ARTHROPLASTY ANTERIOR APPROACH;  Surgeon: Ninetta Lights, MD;  Location: [facility];  Service: Orthopedics;  Laterality: Left;     OB History   No obstetric history on file.     Family History  Problem Relation Age of Onset   COPD Mother    Stroke Mother    Cancer Father     Social History   Tobacco Use   Smoking status: Never   Smokeless  tobacco: Never   Tobacco comments:    2004 last crack   occ alcohol  Vaping Use   Vaping Use: Never used  Substance Use Topics   Alcohol use: No    Comment: 2 beers a year   Drug use: Yes    Frequency: 7.0 times per week    Types: Marijuana    Comment: quit crack 12 years ago. marijuana use for sleep    Home Medications Prior to Admission medications   Medication Sig Start Date End Date Taking? Authorizing Provider  oxyCODONE (ROXICODONE) 5 MG immediate release tablet Take 1 tablet (5 mg total) by mouth every 6 (six) hours as needed. 10/29/20 10/29/21 Yes Duanne Guess, PA-C  predniSONE (DELTASONE) 10 MG tablet Take 1 tablet (10 mg total) by mouth daily. 6,5,4,3,2,1 six day taper 10/29/20  Yes Duanne Guess,  PA-C  ARIPiprazole (ABILIFY) 5 MG tablet Take 5 mg by mouth daily. 06/09/18   [provider]  aspirin EC 325 MG tablet Take 1 tablet (325 mg total) by mouth daily. 1 tab a day for the next 30 days to prevent blood clots Patient not taking: Reported on 06/14/2018 11/05/16   Aundra Dubin, PA-C  Aspirin-Salicylamide-Caffeine (BC HEADACHE POWDER PO) Take 1 packet by mouth as needed (headache).    [provider]  DULoxetine (CYMBALTA) 60 MG capsule Take 120 mg by mouth daily.  05/15/15   [provider]  fenofibrate 160 MG tablet Take 160 mg by mouth daily.     [provider]  guaiFENesin-codeine 100-10 MG/5ML syrup Take 10 mLs by mouth 3 (three) times daily as needed. Patient not taking: Reported on 10/31/2017 10/01/17   Sherrie George B, FNP  HYDROcodone-acetaminophen (NORCO/VICODIN) 5-325 MG tablet Take 1 tablet by mouth every 12 (twelve) hours as needed. 06/14/18   Couture, Cortni S, PA-C  levothyroxine (SYNTHROID, LEVOTHROID) 137 MCG tablet Take 137 mcg by mouth daily. 04/26/18   [provider]  methocarbamol (ROBAXIN) 500 MG tablet Take 1 tablet (500 mg total) by mouth 4 (four) times daily. Patient not taking: Reported on 10/31/2017 11/05/16   Aundra Dubin, PA-C  methocarbamol (ROBAXIN) 500 MG tablet Take 500 mg by mouth every 6 (six) hours as needed for muscle spasms.    [provider]  metoCLOPramide (REGLAN) 10 MG tablet Take 1 tablet (10 mg total) by mouth every 8 (eight) hours as needed for nausea. Patient not taking: Reported on 06/14/2018 10/31/17 10/31/18  Merlyn Lot, MD    Allergies    Cephalexin; Morphine; Naprosyn [naproxen]; Antihistamines, diphenhydramine-type; and Diphenhydramine hcl  Review of Systems   Review of Systems  Constitutional:  Negative for chills and fever.  Respiratory:  Negative for shortness of breath.   Cardiovascular:  Negative for chest pain.  Gastrointestinal:  Negative for abdominal pain, diarrhea,  nausea and vomiting.  Musculoskeletal:  Positive for back pain, gait problem and myalgias. Negative for arthralgias and joint swelling.  Skin:  Negative for rash and wound.   Physical Exam Updated Vital Signs BP 100/60 (BP Location: Left Arm)   Pulse 76   Temp 98.5 F (36.9 C) (Oral)   Resp 16   Ht 5\' 5"  (1.651 m)   Wt 95.3 kg   SpO2 100%   BMI 34.96 kg/m   Physical Exam Constitutional:      Appearance: She is well-developed.  HENT:     Head: Normocephalic and atraumatic.  Eyes:     Conjunctiva/sclera: Conjunctivae normal.  Cardiovascular:  Rate and Rhythm: Normal rate.  Pulmonary:     Effort: Pulmonary effort is normal. No respiratory distress.  Abdominal:     General: There is no distension.     Tenderness: There is no abdominal tenderness. There is no guarding.  Musculoskeletal:     Cervical back: Normal range of motion.     Comments: Mild shortening of the left leg when compared to the right.  She has pain with hip flexion, internal rotation.  She is tender along the trochanteric bursa.  She has tenderness along the groin with palpation.  She is able to straight leg raise.  Patella tracks well.  No knee effusion.  Patient is tender along the lumbar spinous process with left and right paravertebral muscle tenderness.  No sacral or SI joint tenderness patient neurovascular tact in bilateral lower extremities.  Skin:    General: Skin is warm.     Findings: No rash.  Neurological:     Mental Status: She is alert and oriented to person, place, and time.  Psychiatric:        Behavior: Behavior normal.        Thought Content: Thought content normal.    ED Results / Procedures / Treatments   Labs (all labs ordered are listed, but only abnormal results are displayed) Labs Reviewed  BASIC METABOLIC PANEL - Abnormal; Notable for the following components:      Result Value   Creatinine, Ser 1.08 (*)    GFR, Estimated 58 (*)    All other components within normal limits   URINALYSIS, COMPLETE (UACMP) WITH MICROSCOPIC - Abnormal; Notable for the following components:   Color, Urine YELLOW (*)    APPearance CLEAR (*)    Nitrite POSITIVE (*)    Leukocytes,Ua TRACE (*)    Bacteria, UA RARE (*)    All other components within normal limits  CBC    EKG None  Radiology DG Lumbar Spine 2-3 Views  Result Date: 10/29/2020 CLINICAL DATA:  Low back pain for 2 days, initial encounter EXAM: LUMBAR SPINE - 2-3 VIEW COMPARISON:  08/03/2019 FINDINGS: Five lumbar type vertebral bodies are well visualized. Changes of prior fusion at L4-5 are seen and stable. Pedicle screws and posterior fusion elements are noted. Degenerative changes with disc space narrowing throughout the remainder of the lumbar spine is noted. No soft tissue abnormality is seen. Hip replacements are noted bilaterally. IMPRESSION: Degenerative change and prior surgical change without acute abnormality. Electronically Signed   By: Inez Catalina M.D.   On: 10/29/2020 17:53   DG Hip Unilat W or Wo Pelvis 2-3 Views Left  Result Date: 10/29/2020 CLINICAL DATA:  Left hip pain for 2 weeks EXAM: DG HIP (WITH OR WITHOUT PELVIS) 3V LEFT COMPARISON:  12/16/2016 FINDINGS: Pelvic ring is intact. Bilateral hip replacements are noted. Postsurgical changes in the lower lumbar spine are seen. No acute bony abnormality is noted. IMPRESSION: Left hip replacement is noted.  No acute abnormality is seen. Electronically Signed   By: Inez Catalina M.D.   On: 10/29/2020 18:00    Procedures Procedures   Medications Ordered in ED Medications  oxyCODONE-acetaminophen (PERCOCET/ROXICET) 5-325 MG per tablet 1 tablet (1 tablet Oral Given 10/29/20 1640)    ED Course  I have reviewed the triage vital signs and the nursing notes.  Pertinent labs & imaging results that were available during my care of the patient were reviewed by me and considered in my medical decision making (see chart for details).  MDM Rules/Calculators/A&P                           65 year old female with left lateral hip pain, having some groin and buttocks pain also complaints of midline low back pain.  X-rays of the lumbar spine, pelvis, left hip negative.  Hardware stable, no evidence of new fracture.  Left hip prosthesis is stable and intact with no evidence of subsidence or periprosthetic fracture.  Lab work and urinalysis negative.  Patient given prescription for oxycodone and prednisone taper.  She will follow-up with orthopedics.  She understands signs symptoms return to the ER for. Final Clinical Impression(s) / ED Diagnoses Final diagnoses:  Acute hip pain, left  History of total hip arthroplasty, left    Rx / DC Orders ED Discharge Orders          Ordered    predniSONE (DELTASONE) 10 MG tablet  Daily        10/29/20 1823    oxyCODONE (ROXICODONE) 5 MG immediate release tablet  Every 6 hours PRN        10/29/20 1823             Renata Caprice 10/29/20 1827    Duffy Bruce, MD 11/01/20 1153

## 2020-10-29 NOTE — Discharge Instructions (Signed)
Please rest and avoid physical activity.  Apply heating pad to the left hip.  Take oxycodone as needed for severe pain.  Take prednisone taper as prescribed.  You can continue with tizanidine as prescribed.  Return to the ER for any fevers, worsening symptoms or urgent changes in your health.  Call orthopedic office tomorrow to schedule follow-up appointment in 1 week.

## 2020-11-07 DIAGNOSIS — M1612 Unilateral primary osteoarthritis, left hip: Secondary | ICD-10-CM | POA: Diagnosis not present

## 2020-11-14 DIAGNOSIS — M4726 Other spondylosis with radiculopathy, lumbar region: Secondary | ICD-10-CM | POA: Diagnosis not present

## 2020-11-21 DIAGNOSIS — M4726 Other spondylosis with radiculopathy, lumbar region: Secondary | ICD-10-CM | POA: Diagnosis not present

## 2020-12-12 DIAGNOSIS — M5416 Radiculopathy, lumbar region: Secondary | ICD-10-CM | POA: Diagnosis not present

## 2020-12-12 DIAGNOSIS — R030 Elevated blood-pressure reading, without diagnosis of hypertension: Secondary | ICD-10-CM | POA: Diagnosis not present

## 2020-12-12 DIAGNOSIS — Z6832 Body mass index (BMI) 32.0-32.9, adult: Secondary | ICD-10-CM | POA: Diagnosis not present

## 2021-01-03 DIAGNOSIS — M5416 Radiculopathy, lumbar region: Secondary | ICD-10-CM | POA: Diagnosis not present

## 2021-02-26 DIAGNOSIS — E039 Hypothyroidism, unspecified: Secondary | ICD-10-CM | POA: Diagnosis not present

## 2021-02-26 DIAGNOSIS — F324 Major depressive disorder, single episode, in partial remission: Secondary | ICD-10-CM | POA: Diagnosis not present

## 2021-02-26 DIAGNOSIS — R7303 Prediabetes: Secondary | ICD-10-CM | POA: Diagnosis not present

## 2021-04-18 DIAGNOSIS — N1831 Chronic kidney disease, stage 3a: Secondary | ICD-10-CM | POA: Diagnosis not present

## 2021-04-18 DIAGNOSIS — R7303 Prediabetes: Secondary | ICD-10-CM | POA: Diagnosis not present

## 2021-04-18 DIAGNOSIS — E785 Hyperlipidemia, unspecified: Secondary | ICD-10-CM | POA: Diagnosis not present

## 2021-04-18 DIAGNOSIS — E039 Hypothyroidism, unspecified: Secondary | ICD-10-CM | POA: Diagnosis not present

## 2021-05-20 DIAGNOSIS — Z79890 Hormone replacement therapy (postmenopausal): Secondary | ICD-10-CM | POA: Diagnosis not present

## 2021-05-20 DIAGNOSIS — Z9889 Other specified postprocedural states: Secondary | ICD-10-CM | POA: Diagnosis not present

## 2021-05-20 DIAGNOSIS — M1991 Primary osteoarthritis, unspecified site: Secondary | ICD-10-CM | POA: Diagnosis not present

## 2021-05-20 DIAGNOSIS — Z9049 Acquired absence of other specified parts of digestive tract: Secondary | ICD-10-CM | POA: Diagnosis not present

## 2021-05-20 DIAGNOSIS — Z886 Allergy status to analgesic agent status: Secondary | ICD-10-CM | POA: Diagnosis not present

## 2021-05-20 DIAGNOSIS — M542 Cervicalgia: Secondary | ICD-10-CM | POA: Diagnosis not present

## 2021-05-20 DIAGNOSIS — Z888 Allergy status to other drugs, medicaments and biological substances status: Secondary | ICD-10-CM | POA: Diagnosis not present

## 2021-05-20 DIAGNOSIS — M479 Spondylosis, unspecified: Secondary | ICD-10-CM | POA: Diagnosis not present

## 2021-05-20 DIAGNOSIS — S0990XA Unspecified injury of head, initial encounter: Secondary | ICD-10-CM | POA: Diagnosis not present

## 2021-05-20 DIAGNOSIS — Z7982 Long term (current) use of aspirin: Secondary | ICD-10-CM | POA: Diagnosis not present

## 2021-05-20 DIAGNOSIS — N179 Acute kidney failure, unspecified: Secondary | ICD-10-CM | POA: Diagnosis not present

## 2021-05-20 DIAGNOSIS — R519 Headache, unspecified: Secondary | ICD-10-CM | POA: Diagnosis not present

## 2021-05-20 DIAGNOSIS — Z79899 Other long term (current) drug therapy: Secondary | ICD-10-CM | POA: Diagnosis not present

## 2021-05-20 DIAGNOSIS — Z881 Allergy status to other antibiotic agents status: Secondary | ICD-10-CM | POA: Diagnosis not present

## 2021-05-20 DIAGNOSIS — S161XXA Strain of muscle, fascia and tendon at neck level, initial encounter: Secondary | ICD-10-CM | POA: Diagnosis not present

## 2021-05-20 DIAGNOSIS — Z885 Allergy status to narcotic agent status: Secondary | ICD-10-CM | POA: Diagnosis not present

## 2021-05-20 DIAGNOSIS — S199XXA Unspecified injury of neck, initial encounter: Secondary | ICD-10-CM | POA: Diagnosis not present

## 2021-05-20 DIAGNOSIS — F32A Depression, unspecified: Secondary | ICD-10-CM | POA: Diagnosis not present

## 2021-05-20 DIAGNOSIS — Z90710 Acquired absence of both cervix and uterus: Secondary | ICD-10-CM | POA: Diagnosis not present

## 2022-03-17 DIAGNOSIS — M1990 Unspecified osteoarthritis, unspecified site: Secondary | ICD-10-CM | POA: Diagnosis not present

## 2022-03-17 DIAGNOSIS — R944 Abnormal results of kidney function studies: Secondary | ICD-10-CM | POA: Diagnosis not present

## 2022-04-23 DIAGNOSIS — M25551 Pain in right hip: Secondary | ICD-10-CM | POA: Diagnosis not present

## 2022-05-28 DIAGNOSIS — S76011D Strain of muscle, fascia and tendon of right hip, subsequent encounter: Secondary | ICD-10-CM | POA: Diagnosis not present

## 2022-05-28 DIAGNOSIS — M25551 Pain in right hip: Secondary | ICD-10-CM | POA: Diagnosis not present

## 2022-06-04 DIAGNOSIS — G4700 Insomnia, unspecified: Secondary | ICD-10-CM | POA: Diagnosis not present

## 2022-06-04 DIAGNOSIS — N1831 Chronic kidney disease, stage 3a: Secondary | ICD-10-CM | POA: Diagnosis not present

## 2022-06-04 DIAGNOSIS — S76011D Strain of muscle, fascia and tendon of right hip, subsequent encounter: Secondary | ICD-10-CM | POA: Diagnosis not present

## 2022-06-04 DIAGNOSIS — M25551 Pain in right hip: Secondary | ICD-10-CM | POA: Diagnosis not present

## 2022-06-04 DIAGNOSIS — E039 Hypothyroidism, unspecified: Secondary | ICD-10-CM | POA: Diagnosis not present

## 2022-06-11 DIAGNOSIS — S76011D Strain of muscle, fascia and tendon of right hip, subsequent encounter: Secondary | ICD-10-CM | POA: Diagnosis not present

## 2022-06-11 DIAGNOSIS — M25551 Pain in right hip: Secondary | ICD-10-CM | POA: Diagnosis not present

## 2022-06-18 DIAGNOSIS — S76011D Strain of muscle, fascia and tendon of right hip, subsequent encounter: Secondary | ICD-10-CM | POA: Diagnosis not present

## 2022-06-18 DIAGNOSIS — M25551 Pain in right hip: Secondary | ICD-10-CM | POA: Diagnosis not present

## 2022-06-25 DIAGNOSIS — H2513 Age-related nuclear cataract, bilateral: Secondary | ICD-10-CM | POA: Diagnosis not present

## 2022-07-04 ENCOUNTER — Other Ambulatory Visit: Payer: Self-pay

## 2022-07-04 ENCOUNTER — Encounter (HOSPITAL_BASED_OUTPATIENT_CLINIC_OR_DEPARTMENT_OTHER): Payer: Self-pay | Admitting: Emergency Medicine

## 2022-07-04 ENCOUNTER — Emergency Department (HOSPITAL_BASED_OUTPATIENT_CLINIC_OR_DEPARTMENT_OTHER)
Admission: EM | Admit: 2022-07-04 | Discharge: 2022-07-04 | Disposition: A | Discharge status: Home / Self Care | Payer: Medicare Other | Attending: Emergency Medicine | Admitting: Emergency Medicine

## 2022-07-04 ENCOUNTER — Emergency Department (HOSPITAL_BASED_OUTPATIENT_CLINIC_OR_DEPARTMENT_OTHER): Payer: Medicare Other

## 2022-07-04 DIAGNOSIS — R058 Other specified cough: Secondary | ICD-10-CM | POA: Diagnosis not present

## 2022-07-04 DIAGNOSIS — R519 Headache, unspecified: Secondary | ICD-10-CM | POA: Insufficient documentation

## 2022-07-04 DIAGNOSIS — Z1152 Encounter for screening for COVID-19: Secondary | ICD-10-CM | POA: Insufficient documentation

## 2022-07-04 DIAGNOSIS — H538 Other visual disturbances: Secondary | ICD-10-CM | POA: Insufficient documentation

## 2022-07-04 DIAGNOSIS — Z7982 Long term (current) use of aspirin: Secondary | ICD-10-CM | POA: Diagnosis not present

## 2022-07-04 DIAGNOSIS — R1110 Vomiting, unspecified: Secondary | ICD-10-CM | POA: Diagnosis not present

## 2022-07-04 DIAGNOSIS — R059 Cough, unspecified: Secondary | ICD-10-CM | POA: Diagnosis not present

## 2022-07-04 DIAGNOSIS — G4489 Other headache syndrome: Secondary | ICD-10-CM | POA: Diagnosis not present

## 2022-07-04 LAB — RESP PANEL BY RT-PCR (RSV, FLU A&B, COVID)  RVPGX2
Influenza A by PCR: NEGATIVE
Influenza B by PCR: NEGATIVE
Resp Syncytial Virus by PCR: NEGATIVE
SARS Coronavirus 2 by RT PCR: NEGATIVE

## 2022-07-04 MED ORDER — SODIUM CHLORIDE 0.9 % IV BOLUS
500.0000 mL | Freq: Once | INTRAVENOUS | Status: AC
  Administered 2022-07-04: 500 mL via INTRAVENOUS

## 2022-07-04 MED ORDER — METOCLOPRAMIDE HCL 5 MG/ML IJ SOLN
5.0000 mg | Freq: Once | INTRAMUSCULAR | Status: AC
  Administered 2022-07-04: 5 mg via INTRAVENOUS
  Filled 2022-07-04: qty 2

## 2022-07-04 MED ORDER — DEXAMETHASONE SODIUM PHOSPHATE 10 MG/ML IJ SOLN
10.0000 mg | Freq: Once | INTRAMUSCULAR | Status: AC
  Administered 2022-07-04: 10 mg via INTRAVENOUS
  Filled 2022-07-04: qty 1

## 2022-07-04 NOTE — ED Triage Notes (Addendum)
Headache and emesis yesterday , recurrent today at work dure to all the noise she said.  Lethargy, nasal congestion . Cough x 3 weeks . Alert and oriented x 4  reports Hx migraine . Reports light sensitivity

## 2022-07-04 NOTE — Discharge Instructions (Signed)

## 2022-07-04 NOTE — ED Provider Notes (Signed)
[facility] Note   CSN: [id number] Arrival date & time: 07/04/22  1231     History {Add pertinent medical, surgical, social history, OB history to HPI:1} Chief Complaint  Patient presents with   Headache    [name] [name] is a 67 y.o. female who presents with HA + blurry vision, one episode of vomiting. She took a goody powder and it resolved. She had recurrence of her headache today at work and her coworkers were concerned she might be having a stroke so sent her in.  She had the flu 3 weeks ago and has had persistent cough and runny nose states she is feeling a little bit more short of breath.***   Headache      Home Medications Prior to Admission medications   Medication Sig Start Date End Date Taking? Authorizing Provider  ARIPiprazole (ABILIFY) 5 MG tablet Take 5 mg by mouth daily. 06/09/18   [provider]  aspirin EC 325 MG tablet Take 1 tablet (325 mg total) by mouth daily. 1 tab a day for the next 30 days to prevent blood clots Patient not taking: Reported on 06/14/2018 11/05/16   Aundra Dubin, PA-C  Aspirin-Salicylamide-Caffeine (BC HEADACHE POWDER PO) Take 1 packet by mouth as needed (headache).    [provider]  DULoxetine (CYMBALTA) 60 MG capsule Take 120 mg by mouth daily.  05/15/15   [provider]  fenofibrate 160 MG tablet Take 160 mg by mouth daily.     [provider]  guaiFENesin-codeine 100-10 MG/5ML syrup Take 10 mLs by mouth 3 (three) times daily as needed. Patient not taking: Reported on 10/31/2017 10/01/17   Sherrie George B, FNP  HYDROcodone-acetaminophen (NORCO/VICODIN) 5-325 MG tablet Take 1 tablet by mouth every 12 (twelve) hours as needed. 06/14/18   Couture, Cortni S, PA-C  levothyroxine (SYNTHROID, LEVOTHROID) 137 MCG tablet Take 137 mcg by mouth daily. 04/26/18   [provider]  methocarbamol (ROBAXIN) 500 MG tablet Take 1 tablet (500 mg total) by  mouth 4 (four) times daily. Patient not taking: Reported on 10/31/2017 11/05/16   Aundra Dubin, PA-C  methocarbamol (ROBAXIN) 500 MG tablet Take 500 mg by mouth every 6 (six) hours as needed for muscle spasms.    [provider]  metoCLOPramide (REGLAN) 10 MG tablet Take 1 tablet (10 mg total) by mouth every 8 (eight) hours as needed for nausea. Patient not taking: Reported on 06/14/2018 10/31/17 10/31/18  Merlyn Lot, MD  predniSONE (DELTASONE) 10 MG tablet Take 1 tablet (10 mg total) by mouth daily. 6,5,4,3,2,1 six day taper 10/29/20   Duanne Guess, PA-C      Allergies    Cephalexin; Morphine; Naprosyn [naproxen]; Antihistamines, diphenhydramine-type; and Diphenhydramine hcl    Review of Systems   Review of Systems  Neurological:  Positive for headaches.    Physical Exam Updated Vital Signs BP (!) 123/92 (BP Location: Right Arm)   Pulse 70   Temp 97.8 F (36.6 C) (Oral)   Resp 18   Wt 95.7 kg   SpO2 100%   BMI 35.11 kg/m  Physical Exam  ED Results / Procedures / Treatments   Labs (all labs ordered are listed, but only abnormal results are displayed) Labs Reviewed  RESP PANEL BY RT-PCR (RSV, FLU A&B, COVID)  RVPGX2    EKG None  Radiology DG Chest 2 View  Result Date: 07/04/2022 CLINICAL DATA:  Cough, headache EXAM: CHEST - 2 VIEW COMPARISON:  10/01/2017 FINDINGS: The heart size and mediastinal contours are within normal limits. Both lungs are clear. The visualized skeletal structures are unremarkable. IMPRESSION: No active cardiopulmonary disease. Electronically Signed   By: Jerilynn Mages.  Shick M.D.   On: 07/04/2022 13:00    Procedures Procedures  {Document cardiac monitor, telemetry assessment procedure when appropriate:1}  Medications Ordered in ED Medications - No data to display  ED Course/ Medical Decision Making/ A&P   {   Click here for ABCD2, HEART and other calculatorsREFRESH Note before signing :1}                          Medical Decision  Making Amount and/or Complexity of Data Reviewed Radiology: ordered.   ***  {Document critical care time when appropriate:1} {Document review of labs and clinical decision tools ie heart score, Chads2Vasc2 etc:1}  {Document your independent review of radiology images, and any outside records:1} {Document your discussion with family members, caretakers, and with consultants:1} {Document social determinants of health affecting pt's care:1} {Document your decision making why or why not admission, treatments were needed:1} Final Clinical Impression(s) / ED Diagnoses Final diagnoses:  None    Rx / DC Orders ED Discharge Orders     None

## 2022-07-05 NOTE — ED Provider Notes (Incomplete)
[facility] Provider Note   CSN: [id number] Arrival date & time: 07/04/22  1231     History {Add pertinent medical, surgical, social history, OB history to HPI:1} Chief Complaint  Patient presents with  . Headache    [name] [name] is a 67 y.o. female who presents with HA + blurry vision, one episode of vomiting. She took a goody powder and it resolved. She had recurrence of her headache today at work and her coworkers were concerned she might be having a stroke so sent her in.  She had the flu 3 weeks ago and has had persistent cough and runny nose states she is feeling a little bit more short of breath.***   Headache      Home Medications Prior to Admission medications   Medication Sig Start Date End Date Taking? Authorizing Provider  ARIPiprazole (ABILIFY) 5 MG tablet Take 5 mg by mouth daily. 06/09/18   [provider]  aspirin EC 325 MG tablet Take 1 tablet (325 mg total) by mouth daily. 1 tab a day for the next 30 days to prevent blood clots Patient not taking: Reported on 06/14/2018 11/05/16   Aundra Dubin, PA-C  Aspirin-Salicylamide-Caffeine (BC HEADACHE POWDER PO) Take 1 packet by mouth as needed (headache).    [provider]  DULoxetine (CYMBALTA) 60 MG capsule Take 120 mg by mouth daily.  05/15/15   [provider]  fenofibrate 160 MG tablet Take 160 mg by mouth daily.     [provider]  guaiFENesin-codeine 100-10 MG/5ML syrup Take 10 mLs by mouth 3 (three) times daily as needed. Patient not taking: Reported on 10/31/2017 10/01/17   Sherrie George B, FNP  HYDROcodone-acetaminophen (NORCO/VICODIN) 5-325 MG tablet Take 1 tablet by mouth every 12 (twelve) hours as needed. 06/14/18   Couture, Cortni S, PA-C  levothyroxine (SYNTHROID, LEVOTHROID) 137 MCG tablet Take 137 mcg by mouth daily. 04/26/18   [provider]  methocarbamol (ROBAXIN) 500 MG tablet Take 1 tablet (500 mg total) by  mouth 4 (four) times daily. Patient not taking: Reported on 10/31/2017 11/05/16   Aundra Dubin, PA-C  methocarbamol (ROBAXIN) 500 MG tablet Take 500 mg by mouth every 6 (six) hours as needed for muscle spasms.    [provider]  metoCLOPramide (REGLAN) 10 MG tablet Take 1 tablet (10 mg total) by mouth every 8 (eight) hours as needed for nausea. Patient not taking: Reported on 06/14/2018 10/31/17 10/31/18  Merlyn Lot, MD  predniSONE (DELTASONE) 10 MG tablet Take 1 tablet (10 mg total) by mouth daily. 6,5,4,3,2,1 six day taper 10/29/20   Duanne Guess, PA-C      Allergies    Cephalexin; Morphine; Naprosyn [naproxen]; Antihistamines, diphenhydramine-type; and Diphenhydramine hcl    Review of Systems   Review of Systems  Neurological:  Positive for headaches.    Physical Exam Updated Vital Signs BP (!) 123/92 (BP Location: Right Arm)   Pulse 70   Temp 97.8 F (36.6 C) (Oral)   Resp 18   Wt 95.7 kg   SpO2 100%   BMI 35.11 kg/m  Physical Exam  ED Results / Procedures / Treatments   Labs (all labs ordered are listed, but only abnormal results are displayed) Labs Reviewed  RESP PANEL BY RT-PCR (RSV, FLU A&B, COVID)  RVPGX2    EKG None  Radiology DG Chest 2 View  Result Date: 07/04/2022 CLINICAL DATA:  Cough, headache EXAM: CHEST - 2 VIEW COMPARISON:  10/01/2017 FINDINGS: The heart size and mediastinal contours are within normal limits. Both lungs are clear. The visualized skeletal structures are unremarkable. IMPRESSION: No active cardiopulmonary disease. Electronically Signed   By: Jerilynn Mages.  Shick M.D.   On: 07/04/2022 13:00    Procedures Procedures  {Document cardiac monitor, telemetry assessment procedure when appropriate:1}  Medications Ordered in ED Medications - No data to display  ED Course/ Medical Decision Making/ A&P   {   Click here for ABCD2, HEART and other calculatorsREFRESH Note before signing :1}                          Medical Decision  Making Amount and/or Complexity of Data Reviewed Radiology: ordered.  Risk Prescription drug management.   ***  {Document critical care time when appropriate:1} {Document review of labs and clinical decision tools ie heart score, Chads2Vasc2 etc:1}  {Document your independent review of radiology images, and any outside records:1} {Document your discussion with family members, caretakers, and with consultants:1} {Document social determinants of health affecting pt's care:1} {Document your decision making why or why not admission, treatments were needed:1} Final Clinical Impression(s) / ED Diagnoses Final diagnoses:  None    Rx / DC Orders ED Discharge Orders     None

## 2022-07-14 DIAGNOSIS — E039 Hypothyroidism, unspecified: Secondary | ICD-10-CM | POA: Diagnosis not present

## 2022-07-19 DIAGNOSIS — J209 Acute bronchitis, unspecified: Secondary | ICD-10-CM | POA: Diagnosis not present

## 2022-07-19 DIAGNOSIS — R059 Cough, unspecified: Secondary | ICD-10-CM | POA: Diagnosis not present

## 2022-07-19 DIAGNOSIS — E039 Hypothyroidism, unspecified: Secondary | ICD-10-CM | POA: Diagnosis not present

## 2022-07-19 DIAGNOSIS — Z20822 Contact with and (suspected) exposure to covid-19: Secondary | ICD-10-CM | POA: Diagnosis not present

## 2022-08-13 DIAGNOSIS — J040 Acute laryngitis: Secondary | ICD-10-CM | POA: Diagnosis not present

## 2022-12-17 DIAGNOSIS — R7303 Prediabetes: Secondary | ICD-10-CM | POA: Diagnosis not present

## 2022-12-17 DIAGNOSIS — Z1211 Encounter for screening for malignant neoplasm of colon: Secondary | ICD-10-CM | POA: Diagnosis not present

## 2022-12-17 DIAGNOSIS — E785 Hyperlipidemia, unspecified: Secondary | ICD-10-CM | POA: Diagnosis not present

## 2022-12-17 DIAGNOSIS — Z23 Encounter for immunization: Secondary | ICD-10-CM | POA: Diagnosis not present

## 2022-12-17 DIAGNOSIS — Z0000 Encounter for general adult medical examination without abnormal findings: Secondary | ICD-10-CM | POA: Diagnosis not present

## 2022-12-17 DIAGNOSIS — E039 Hypothyroidism, unspecified: Secondary | ICD-10-CM | POA: Diagnosis not present

## 2022-12-17 DIAGNOSIS — K219 Gastro-esophageal reflux disease without esophagitis: Secondary | ICD-10-CM | POA: Diagnosis not present

## 2022-12-17 DIAGNOSIS — Z1239 Encounter for other screening for malignant neoplasm of breast: Secondary | ICD-10-CM | POA: Diagnosis not present

## 2022-12-25 ENCOUNTER — Other Ambulatory Visit: Payer: Self-pay | Admitting: Family Medicine

## 2022-12-25 DIAGNOSIS — Z1231 Encounter for screening mammogram for malignant neoplasm of breast: Secondary | ICD-10-CM

## 2022-12-25 DIAGNOSIS — E2839 Other primary ovarian failure: Secondary | ICD-10-CM

## 2023-03-05 ENCOUNTER — Ambulatory Visit: Payer: Medicare Other

## 2023-03-30 ENCOUNTER — Ambulatory Visit
Admission: RE | Admit: 2023-03-30 | Discharge: 2023-03-30 | Disposition: A | Discharge status: Home / Self Care | Payer: Medicare Other | Source: Ambulatory Visit | Attending: Family Medicine | Admitting: Family Medicine

## 2023-03-30 DIAGNOSIS — Z1231 Encounter for screening mammogram for malignant neoplasm of breast: Secondary | ICD-10-CM | POA: Diagnosis not present

## 2023-04-01 ENCOUNTER — Encounter: Payer: Self-pay | Admitting: *Deleted

## 2023-04-07 DIAGNOSIS — M2550 Pain in unspecified joint: Secondary | ICD-10-CM | POA: Diagnosis not present

## 2023-04-07 DIAGNOSIS — M65331 Trigger finger, right middle finger: Secondary | ICD-10-CM | POA: Diagnosis not present

## 2023-04-07 DIAGNOSIS — M79644 Pain in right finger(s): Secondary | ICD-10-CM | POA: Diagnosis not present

## 2023-04-13 ENCOUNTER — Telehealth: Payer: Self-pay

## 2023-04-13 NOTE — Telephone Encounter (Signed)
The patient called and left a voicemail stating She wants to schedule her colonoscopy. I called the patient back to inform him we received his message, and message been sent to the scheduler.

## 2023-04-14 ENCOUNTER — Other Ambulatory Visit: Payer: Self-pay | Admitting: *Deleted

## 2023-04-14 ENCOUNTER — Telehealth: Payer: Self-pay | Admitting: *Deleted

## 2023-04-14 DIAGNOSIS — Z860100 Personal history of colon polyps, unspecified: Secondary | ICD-10-CM

## 2023-04-14 NOTE — Telephone Encounter (Signed)
Called patient this morning. She called back and we have schedule her with Dr Servando Snare on 05/14/2023 at [facility]

## 2023-04-14 NOTE — Addendum Note (Signed)
Addended by: Tawnya Crook on: 04/14/2023 02:53 PM   Modules accepted: Orders

## 2023-04-14 NOTE — Telephone Encounter (Signed)
Gastroenterology Pre-Procedure Review  Request Date: 05/14/2023 Requesting Physician: Dr. Servando Snare  PATIENT REVIEW QUESTIONS: The patient responded to the following health history questions as indicated:    1. Are you having any GI issues? no 2. Do you have a personal history of Polyps? yes (last colonoscopy with Dr Servando Snare on 4/6/20217) 3. Do you have a family history of Colon Cancer or Polyps? no 4. Diabetes Mellitus? no 5. Joint replacements in the past 12 months?no 6. Major health problems in the past 3 months?no 7. Any artificial heart valves, MVP, or defibrillator?no    MEDICATIONS & ALLERGIES:    Patient reports the following regarding taking any anticoagulation/antiplatelet therapy:   Plavix, Coumadin, Eliquis, Xarelto, Lovenox, Pradaxa, Brilinta, or Effient? no Aspirin? no  Patient confirms/reports the following medications:  Current Outpatient Medications  Medication Sig Dispense Refill   ARIPiprazole (ABILIFY) 5 MG tablet Take 5 mg by mouth daily.     Aspirin-Salicylamide-Caffeine (BC HEADACHE POWDER PO) Take 1 packet by mouth as needed (headache).     DULoxetine (CYMBALTA) 60 MG capsule Take 120 mg by mouth daily.      fenofibrate 160 MG tablet Take 160 mg by mouth daily.      guaiFENesin-codeine 100-10 MG/5ML syrup Take 10 mLs by mouth 3 (three) times daily as needed. (Patient not taking: Reported on 10/31/2017) 120 mL 0   HYDROcodone-acetaminophen (NORCO/VICODIN) 5-325 MG tablet Take 1 tablet by mouth every 12 (twelve) hours as needed. 6 tablet 0   levothyroxine (SYNTHROID, LEVOTHROID) 137 MCG tablet Take 137 mcg by mouth daily.     methocarbamol (ROBAXIN) 500 MG tablet Take 1 tablet (500 mg total) by mouth 4 (four) times daily. (Patient not taking: Reported on 10/31/2017) 90 tablet 0   methocarbamol (ROBAXIN) 500 MG tablet Take 500 mg by mouth every 6 (six) hours as needed for muscle spasms.     metoCLOPramide (REGLAN) 10 MG tablet Take 1 tablet (10 mg total) by mouth every 8  (eight) hours as needed for nausea. (Patient not taking: Reported on 06/14/2018) 20 tablet 0   predniSONE (DELTASONE) 10 MG tablet Take 1 tablet (10 mg total) by mouth daily. 6,5,4,3,2,1 six day taper 21 tablet 0   No current facility-administered medications for this visit.    Patient confirms/reports the following allergies:  Allergies  Allergen Reactions   Cephalexin Swelling    Facial Swelling   Morphine     REACTION: Bradycardia, Hypotension   Naprosyn [Naproxen] Swelling    Facial swelling   Antihistamines, Diphenhydramine-Type Palpitations   Diphenhydramine Hcl Palpitations    No orders of the defined types were placed in this encounter.   AUTHORIZATION INFORMATION Primary Insurance: 1D#: Group #:  Secondary Insurance: 1D#: Group #:  SCHEDULE INFORMATION: Date: 05/14/2023 Time: Location:  [facility]

## 2023-04-15 NOTE — Telephone Encounter (Signed)
Due to patient's insurance resetting for the next calendar year.   Called patient on Tuesday afternoon and we are rescheduling to 05/20/2023.  Then I received a call from patient this morning, we will have to reschedule to 05/21/2023 because her boyfriend has an appointment on 05/20/2023.  Called endo unit and spoken to [facility] to make the change.  New instructions will be sent. Rx for prep will be sent closer to the date per patient's request, she did not want it sent to [facility] right now.  New instructions will be sent.

## 2023-04-30 DIAGNOSIS — M65331 Trigger finger, right middle finger: Secondary | ICD-10-CM | POA: Diagnosis not present

## 2023-05-14 ENCOUNTER — Other Ambulatory Visit: Payer: Self-pay | Admitting: *Deleted

## 2023-05-14 MED ORDER — NA SULFATE-K SULFATE-MG SULF 17.5-3.13-1.6 GM/177ML PO SOLN: 1.0000 | Freq: Once | ORAL | 0 refills | Status: AC

## 2023-05-21 ENCOUNTER — Ambulatory Visit: Payer: Medicare Other | Admitting: Anesthesiology

## 2023-05-21 ENCOUNTER — Ambulatory Visit
Admission: RE | Admit: 2023-05-21 | Discharge: 2023-05-21 | Disposition: A | Discharge status: Home / Self Care | Payer: Medicare Other | Attending: Gastroenterology | Admitting: Gastroenterology

## 2023-05-21 ENCOUNTER — Encounter: Payer: Self-pay | Admitting: Gastroenterology

## 2023-05-21 ENCOUNTER — Encounter
Admission: RE | Disposition: A | Discharge status: Home / Self Care | Payer: Self-pay | Source: Home / Self Care | Attending: Gastroenterology

## 2023-05-21 DIAGNOSIS — E039 Hypothyroidism, unspecified: Secondary | ICD-10-CM | POA: Diagnosis not present

## 2023-05-21 DIAGNOSIS — K635 Polyp of colon: Secondary | ICD-10-CM

## 2023-05-21 DIAGNOSIS — Z860100 Personal history of colon polyps, unspecified: Secondary | ICD-10-CM | POA: Diagnosis not present

## 2023-05-21 DIAGNOSIS — G4730 Sleep apnea, unspecified: Secondary | ICD-10-CM | POA: Diagnosis not present

## 2023-05-21 DIAGNOSIS — K5730 Diverticulosis of large intestine without perforation or abscess without bleeding: Secondary | ICD-10-CM | POA: Diagnosis not present

## 2023-05-21 DIAGNOSIS — Z980 Intestinal bypass and anastomosis status: Secondary | ICD-10-CM | POA: Insufficient documentation

## 2023-05-21 DIAGNOSIS — D122 Benign neoplasm of ascending colon: Secondary | ICD-10-CM | POA: Diagnosis not present

## 2023-05-21 DIAGNOSIS — Z1211 Encounter for screening for malignant neoplasm of colon: Secondary | ICD-10-CM | POA: Diagnosis not present

## 2023-05-21 HISTORY — PX: COLONOSCOPY WITH PROPOFOL: SHX5780

## 2023-05-21 HISTORY — DX: Polyp of colon: K63.5

## 2023-05-21 HISTORY — DX: Personal history of colon polyps, unspecified: Z86.0100

## 2023-05-21 HISTORY — DX: Dyspnea, unspecified: R06.00

## 2023-05-21 HISTORY — PX: POLYPECTOMY: SHX5525

## 2023-05-21 SURGERY — COLONOSCOPY WITH PROPOFOL
Anesthesia: General

## 2023-05-21 MED ORDER — PROPOFOL 10 MG/ML IV BOLUS
INTRAVENOUS | Status: DC | PRN
  Administered 2023-05-21: 100 ug/kg/min via INTRAVENOUS
  Administered 2023-05-21: 100 mg via INTRAVENOUS

## 2023-05-21 MED ORDER — SODIUM CHLORIDE 0.9 % IV SOLN: INTRAVENOUS | Status: DC

## 2023-05-21 MED ORDER — PROPOFOL 1000 MG/100ML IV EMUL
INTRAVENOUS | Status: AC
  Filled 2023-05-21: qty 200

## 2023-05-21 MED ORDER — PHENYLEPHRINE HCL (PRESSORS) 10 MG/ML IV SOLN
INTRAVENOUS | Status: DC | PRN
  Administered 2023-05-21: 100 ug via INTRAVENOUS

## 2023-05-21 MED ORDER — EPHEDRINE SULFATE-NACL 50-0.9 MG/10ML-% IV SOSY
PREFILLED_SYRINGE | INTRAVENOUS | Status: DC | PRN
  Administered 2023-05-21 (×2): 5 mg via INTRAVENOUS

## 2023-05-21 MED ORDER — LIDOCAINE HCL (PF) 2 % IJ SOLN
INTRAMUSCULAR | Status: AC
  Filled 2023-05-21: qty 5

## 2023-05-21 MED ORDER — LIDOCAINE HCL (CARDIAC) PF 100 MG/5ML IV SOSY
PREFILLED_SYRINGE | INTRAVENOUS | Status: DC | PRN
  Administered 2023-05-21: 50 mg via INTRAVENOUS

## 2023-05-21 NOTE — Transfer of Care (Signed)
 Immediate Anesthesia Transfer of Care Note  Patient: [name] [name]  Procedure(s) Performed: COLONOSCOPY WITH PROPOFOL  POLYPECTOMY  Patient Location: PACU  Anesthesia Type:MAC  Level of Consciousness: awake and alert   Airway & Oxygen Therapy: Patient Spontanous Breathing  Post-op Assessment: Report given to RN and Post -op Vital signs reviewed and stable  Post vital signs: stable  Last Vitals:  Vitals Value Taken Time  BP 94/57 05/21/23 0815  Temp 36.1 C 05/21/23 0813  Pulse 73 05/21/23 0815  Resp 21 05/21/23 0815  SpO2 95 % 05/21/23 0815  Vitals shown include unfiled device data.  Last Pain:  Vitals:   05/21/23 0813  TempSrc: Temporal  PainSc: Asleep         Complications: No notable events documented.

## 2023-05-21 NOTE — Anesthesia Preprocedure Evaluation (Signed)
 Anesthesia Evaluation  Patient identified by MRN, date of birth, ID band Patient awake    Reviewed: Allergy & Precautions, NPO status , Patient's Chart, lab work & pertinent test results  History of Anesthesia Complications Negative for: history of anesthetic complications  Airway Mallampati: III  TM Distance: <3 FB Neck ROM: full    Dental  (+) Chipped, Poor Dentition, Missing, Upper Dentures   Pulmonary neg shortness of breath, sleep apnea    Pulmonary exam normal        Cardiovascular Exercise Tolerance: Good Normal cardiovascular exam+ dysrhythmias      Neuro/Psych  Headaches  negative psych ROS   GI/Hepatic Neg liver ROS,GERD  Controlled,,  Endo/Other  Hypothyroidism    Renal/GU negative Renal ROS  negative genitourinary   Musculoskeletal   Abdominal   Peds  Hematology negative hematology ROS (+)   Anesthesia Other Findings Past Medical History: No date: Anxiety No date: DDD (degenerative disc disease), lumbar     Comment:  BACK, FINGER, WRIST No date: DDD (degenerative disc disease), lumbar No date: Depression No date: Diverticulitis No date: Dyspnea No date: Dysrhythmia No date: GERD (gastroesophageal reflux disease) No date: Headache No date: Hypothyroidism No date: Irregular heart rate No date: Pneumonia     Comment:  hx No date: Sleep apnea     Comment:  unable to wear mask-sleep study 3-4 yrs ago No date: Spine malformation     Comment:  Kiari Malformation No date: Thyroid  disease No date: Wears dentures     Comment:  UPPER  Past Surgical History: 68 years old: ABDOMINAL HYSTERECTOMY 68 years old: APPENDECTOMY 1992: BACK SURGERY     Comment:  l4 and l5 2012: CARPAL TUNNEL RELEASE; Bilateral 08/16/2015: COLONOSCOPY WITH PROPOFOL ; N/A     Comment:  Procedure: COLONOSCOPY WITH PROPOFOL ;  Surgeon: Rogelia Copping, MD;  Location: [facility];  Service:                Endoscopy;  Laterality: N/A; 08: CRANIECTOMY SUBOCCIPITAL W/ CERVICAL LAMINECTOMY / CHIARI No date: JOINT REPLACEMENT 09/06/2015: LAPAROSCOPIC SIGMOID COLECTOMY; N/A     Comment:  Procedure: open sigmoid and descending colectomy, lysis               of adhesions, omenteum flap;  Surgeon: Laneta JULIANNA Luna, MD;              Location: [facility] ORS;  Service: General;  Laterality: N/A; 08/16/2015: POLYPECTOMY     Comment:  Procedure: POLYPECTOMY;  Surgeon: Rogelia Copping, MD;                Location: [facility];  Service: Endoscopy;; No date: SPINAL FUSION as child: TONSILLECTOMY 01/12/2013: TOTAL HIP ARTHROPLASTY; Right     Comment:  Procedure: TOTAL HIP ARTHROPLASTY WITH AUTOGRAFT;                Surgeon: Toribio JULIANNA Chancy, MD;  Location: [facility] OR;  Service:              Orthopedics;  Laterality: Right; 11/05/2016: TOTAL HIP ARTHROPLASTY; Left     Comment:  Procedure: LEFT TOTAL HIP ARTHROPLASTY ANTERIOR               APPROACH;  Surgeon: Chancy Toribio JULIANNA, MD;  Location: [facility]               OR;  Service: Orthopedics;  Laterality: Left;  BMI  Body Mass Index: 37.55 kg/m      Reproductive/Obstetrics negative OB ROS                             Anesthesia Physical Anesthesia Plan  ASA: 3  Anesthesia Plan: General   Post-op Pain Management:    Induction: Intravenous  PONV Risk Score and Plan: Propofol  infusion and TIVA  Airway Management Planned: Natural Airway and Nasal Cannula  Additional Equipment:   Intra-op Plan:   Post-operative Plan:   Informed Consent: I have reviewed the patients History and Physical, chart, labs and discussed the procedure including the risks, benefits and alternatives for the proposed anesthesia with the patient or authorized representative who has indicated his/her understanding and acceptance.     Dental Advisory Given  Plan Discussed with: Anesthesiologist, CRNA and Surgeon  Anesthesia Plan Comments: (Patient consented for  risks of anesthesia including but not limited to:  - adverse reactions to medications - risk of airway placement if required - damage to eyes, teeth, lips or other oral mucosa - nerve damage due to positioning  - sore throat or hoarseness - Damage to heart, brain, nerves, lungs, other parts of body or loss of life  Patient voiced understanding and assent.)       Anesthesia Quick Evaluation

## 2023-05-21 NOTE — Anesthesia Postprocedure Evaluation (Signed)
 Anesthesia Post Note  Patient: [name] [name]  Procedure(s) Performed: COLONOSCOPY WITH PROPOFOL  POLYPECTOMY  Patient location during evaluation: Endoscopy Anesthesia Type: General Level of consciousness: awake and alert Pain management: pain level controlled Vital Signs Assessment: post-procedure vital signs reviewed and stable Respiratory status: spontaneous breathing, nonlabored ventilation, respiratory function stable and patient connected to nasal cannula oxygen Cardiovascular status: blood pressure returned to baseline and stable Postop Assessment: no apparent nausea or vomiting Anesthetic complications: no   No notable events documented.   Last Vitals:  Vitals:   05/21/23 0823 05/21/23 0833  BP: 106/63 119/63  Pulse: 75 64  Resp:    Temp:    SpO2: 97% 97%    Last Pain:  Vitals:   05/21/23 0833  TempSrc:   PainSc: 0-No pain                 Fairy POUR Jupiter Kabir

## 2023-05-21 NOTE — Op Note (Signed)
 [facility] Gastroenterology Patient Name: [name] [name] Procedure Date: 05/21/2023 7:12 AM MRN: [id number] Account #: [id number] Date of Birth: 04-Mar-1956 Admit Type: Outpatient Age: 68 Room: [facility] ENDO ROOM 4 Gender: Female Note Status: Finalized Instrument Name: [id number] Procedure:             Colonoscopy Indications:           High risk colon cancer surveillance: Personal history                         of colonic polyps Providers:             Rogelia Copping MD, MD Referring MD:          Alberta IVAR Sharps MD, MD (Referring MD) Medicines:             Propofol  per Anesthesia Complications:         No immediate complications. Procedure:             Pre-Anesthesia Assessment:                        - Prior to the procedure, a History and Physical was                         performed, and patient medications and allergies were                         reviewed. The patient's tolerance of previous                         anesthesia was also reviewed. The risks and benefits                         of the procedure and the sedation options and risks                         were discussed with the patient. All questions were                         answered, and informed consent was obtained. Prior                         Anticoagulants: The patient has taken no anticoagulant                         or antiplatelet agents. ASA Grade Assessment: II - A                         patient with mild systemic disease. After reviewing                         the risks and benefits, the patient was deemed in                         satisfactory condition to undergo the procedure.                        After obtaining informed consent, the colonoscope was  passed under direct vision. Throughout the procedure,                         the patient's blood pressure, pulse, and oxygen                         saturations were monitored continuously. The                          Colonoscope was introduced through the anus and                         advanced to the the terminal ileum. The colonoscopy                         was performed without difficulty. The patient                         tolerated the procedure well. The quality of the bowel                         preparation was excellent. Findings:      The perianal and digital rectal examinations were normal.      A 4 mm polyp was found in the ascending colon. The polyp was sessile.       The polyp was removed with a cold snare. Resection and retrieval were       complete.      A 2 mm polyp was found in the cecum. The polyp was sessile. The polyp       was removed with a cold snare. Resection and retrieval were complete.      The terminal ileum appeared normal.      There was evidence of a prior side-to-side colo-colonic anastomosis in       the recto-sigmoid colon. This was patent. The anastomosis was traversed.      Multiple small-mouthed diverticula were found in the entire colon. Impression:            - One 4 mm polyp in the ascending colon, removed with                         a cold snare. Resected and retrieved.                        - One 2 mm polyp in the cecum, removed with a cold                         snare. Resected and retrieved.                        - The examined portion of the ileum was normal.                        - Patent side-to-side colo-colonic anastomosis.                        - Diverticulosis in the entire examined colon.                        -  S/P sigmoidesctomy. Recommendation:        - Discharge patient to home.                        - Resume previous diet.                        - Continue present medications.                        - Await pathology results.                        - Repeat colonoscopy in 5 years for surveillance. Procedure Code(s):     --- Professional ---                        [phone], Colonoscopy, flexible; with removal of                          tumor(s), polyp(s), or other lesion(s) by snare                         technique Diagnosis Code(s):     --- Professional ---                        Z86.010, Personal history of colonic polyps                        D12.2, Benign neoplasm of ascending colon CPT copyright 2022 American Medical Association. All rights reserved. The codes documented in this report are preliminary and upon coder review may  be revised to meet current compliance requirements. Rogelia Copping MD, MD 05/21/2023 8:13:31 AM This report has been signed electronically. Number of Addenda: 0 Note Initiated On: 05/21/2023 7:12 AM Scope Withdrawal Time: 0 hours 8 minutes 14 seconds  Total Procedure Duration: 0 hours 12 minutes 43 seconds  Estimated Blood Loss:  Estimated blood loss: none.      [facility]

## 2023-05-21 NOTE — H&P (Signed)
 Rogelia Copping, MD [facility] [address], [address] [place], [place] [address] Phone:[phone] Fax : [phone]  Primary Care Physician:  Claudene Pellet, MD Primary Gastroenterologist:  Dr. Copping  Pre-Procedure History & Physical: HPI:  [name] [name] is a 68 y.o. female is here for an colonoscopy.   Past Medical History:  Diagnosis Date   Anxiety    DDD (degenerative disc disease), lumbar    BACK, FINGER, WRIST   DDD (degenerative disc disease), lumbar    Depression    Diverticulitis    Dyspnea    Dysrhythmia    GERD (gastroesophageal reflux disease)    Headache    Hypothyroidism    Irregular heart rate    Pneumonia    hx   Sleep apnea    unable to wear mask-sleep study 3-4 yrs ago   Spine malformation    Kiari Malformation   Thyroid  disease    Wears dentures    UPPER    Past Surgical History:  Procedure Laterality Date   ABDOMINAL HYSTERECTOMY  68 years old   APPENDECTOMY  68 years old   BACK SURGERY  1992   l33 and l5   CARPAL TUNNEL RELEASE Bilateral 2012   COLONOSCOPY WITH PROPOFOL  N/A 08/16/2015   Procedure: COLONOSCOPY WITH PROPOFOL ;  Surgeon: Rogelia Copping, MD;  Location: [facility];  Service: Endoscopy;  Laterality: N/A;   CRANIECTOMY SUBOCCIPITAL W/ CERVICAL LAMINECTOMY / CHIARI  08   JOINT REPLACEMENT     LAPAROSCOPIC SIGMOID COLECTOMY N/A 09/06/2015   Procedure: open sigmoid and descending colectomy, lysis of adhesions, omenteum flap;  Surgeon: Laneta JULIANNA Luna, MD;  Location: [facility] ORS;  Service: General;  Laterality: N/A;   POLYPECTOMY  08/16/2015   Procedure: POLYPECTOMY;  Surgeon: Rogelia Copping, MD;  Location: [facility];  Service: Endoscopy;;   SPINAL FUSION     TONSILLECTOMY  as child   TOTAL HIP ARTHROPLASTY Right 01/12/2013   Procedure: TOTAL HIP ARTHROPLASTY WITH AUTOGRAFT;  Surgeon: Toribio JULIANNA Chancy, MD;  Location: [facility] OR;  Service: Orthopedics;  Laterality: Right;   TOTAL HIP ARTHROPLASTY Left 11/05/2016   Procedure: LEFT TOTAL HIP  ARTHROPLASTY ANTERIOR APPROACH;  Surgeon: Chancy Toribio JULIANNA, MD;  Location: [facility] OR;  Service: Orthopedics;  Laterality: Left;    Prior to Admission medications   Medication Sig Start Date End Date Taking? Authorizing Provider  ARIPiprazole (ABILIFY) 5 MG tablet Take 5 mg by mouth daily. 06/09/18   [provider]  Aspirin -Salicylamide-Caffeine (BC HEADACHE POWDER PO) Take 1 packet by mouth as needed (headache).    [provider]  DULoxetine  (CYMBALTA ) 60 MG capsule Take 120 mg by mouth daily.  05/15/15   [provider]  fenofibrate  160 MG tablet Take 160 mg by mouth daily.     [provider]  guaiFENesin -codeine  100-10 MG/5ML syrup Take 10 mLs by mouth 3 (three) times daily as needed. Patient not taking: Reported on 10/31/2017 10/01/17   Triplett, Cari B, FNP  HYDROcodone -acetaminophen  (NORCO/VICODIN) 5-325 MG tablet Take 1 tablet by mouth every 12 (twelve) hours as needed. 06/14/18   Couture, Cortni S, PA-C  levothyroxine  (SYNTHROID , LEVOTHROID) 137 MCG tablet Take 137 mcg by mouth daily. 04/26/18   [provider]  methocarbamol  (ROBAXIN ) 500 MG tablet Take 1 tablet (500 mg total) by mouth 4 (four) times daily. Patient not taking: Reported on 10/31/2017 11/05/16   Jule Ronal LITTIE, PA-C  methocarbamol  (ROBAXIN ) 500 MG tablet Take 500 mg by mouth every 6 (six) hours as needed  for muscle spasms.    [provider]  metoCLOPramide  (REGLAN ) 10 MG tablet Take 1 tablet (10 mg total) by mouth every 8 (eight) hours as needed for nausea. Patient not taking: Reported on 06/14/2018 10/31/17 10/31/18  Lang Dover, MD  predniSONE  (DELTASONE ) 10 MG tablet Take 1 tablet (10 mg total) by mouth daily. 6,5,4,3,2,1 six day taper Patient not taking: Reported on 05/21/2023 10/29/20   Charlene Debby BROCKS, PA-C    Allergies as of 04/15/2023 - Review Complete 07/04/2022  Allergen Reaction Noted   Cephalexin Swelling 10/01/2017   Morphine   11/07/2009   Naprosyn [naproxen]  Swelling 10/01/2017   Antihistamines, diphenhydramine -type Palpitations 10/01/2017   Diphenhydramine  hcl Palpitations 04/27/2011    Family History  Problem Relation Age of Onset   COPD Mother    Stroke Mother    Cancer Father     Social History   Socioeconomic History   Marital status: Single    Spouse name: Not on file   Number of children: Not on file   Years of education: Not on file   Highest education level: Not on file  Occupational History   Not on file  Tobacco Use   Smoking status: Never   Smokeless tobacco: Never   Tobacco comments:    2004 last crack   occ alcohol  Vaping Use   Vaping status: Never Used  Substance and Sexual Activity   Alcohol use: Yes    Comment: 2 beers a year   Drug use: Yes    Frequency: 7.0 times per week    Types: Marijuana    Comment: quit crack 12 years ago. marijuana use for sleep   Sexual activity: Not on file  Other Topics Concern   Not on file  Social History Narrative   Not on file   Social Drivers of Health   Financial Resource Strain: Not on file  Food Insecurity: Not on file  Transportation Needs: Not on file  Physical Activity: Not on file  Stress: Not on file  Social Connections: Unknown (09/20/2021)   Received from [facility]   Social Network    Social Network: Not on file  Intimate Partner Violence: Unknown (08/12/2021)   Received from [facility]   HITS    Physically Hurt: Not on file    Insult or Talk Down To: Not on file    Threaten Physical Harm: Not on file    Scream or Curse: Not on file    Review of Systems: See HPI, otherwise negative ROS  Physical Exam: BP 120/65   Pulse 69   Temp (!) 96.5 F (35.8 C) (Temporal)   Resp 20   Ht 5' 3 (1.6 m)   Wt 96.2 kg   SpO2 95%   BMI 37.55 kg/m  General:   Alert,  pleasant and cooperative in NAD Head:  Normocephalic and atraumatic. Neck:  Supple; no masses or thyromegaly. Lungs:  Clear throughout to auscultation.     Heart:  Regular rate and rhythm. Abdomen:  Soft, nontender and nondistended. Normal bowel sounds, without guarding, and without rebound.   Neurologic:  Alert and  oriented x4;  grossly normal neurologically.  Impression/Plan: [name] [name] is here for an colonoscopy to be performed for a history of adenomatous polyps on 2017   Risks, benefits, limitations, and alternatives regarding  colonoscopy have been reviewed with the patient.  Questions have been answered.  All parties agreeable.   Rogelia Copping, MD  05/21/2023, 7:41 AM

## 2023-05-22 ENCOUNTER — Encounter: Payer: Self-pay | Admitting: Gastroenterology

## 2023-05-22 LAB — SURGICAL PATHOLOGY

## 2023-05-26 ENCOUNTER — Encounter: Payer: Self-pay | Admitting: Cardiology

## 2023-05-26 DIAGNOSIS — F32A Depression, unspecified: Secondary | ICD-10-CM | POA: Insufficient documentation

## 2023-05-26 DIAGNOSIS — I499 Cardiac arrhythmia, unspecified: Secondary | ICD-10-CM | POA: Insufficient documentation

## 2023-05-26 DIAGNOSIS — Z972 Presence of dental prosthetic device (complete) (partial): Secondary | ICD-10-CM | POA: Insufficient documentation

## 2023-05-26 DIAGNOSIS — G4730 Sleep apnea, unspecified: Secondary | ICD-10-CM | POA: Insufficient documentation

## 2023-05-26 DIAGNOSIS — R519 Headache, unspecified: Secondary | ICD-10-CM | POA: Insufficient documentation

## 2023-05-26 DIAGNOSIS — K219 Gastro-esophageal reflux disease without esophagitis: Secondary | ICD-10-CM | POA: Insufficient documentation

## 2023-05-26 DIAGNOSIS — M51369 Other intervertebral disc degeneration, lumbar region without mention of lumbar back pain or lower extremity pain: Secondary | ICD-10-CM | POA: Insufficient documentation

## 2023-05-26 DIAGNOSIS — R0600 Dyspnea, unspecified: Secondary | ICD-10-CM | POA: Insufficient documentation

## 2023-05-26 DIAGNOSIS — F419 Anxiety disorder, unspecified: Secondary | ICD-10-CM | POA: Insufficient documentation

## 2023-05-26 DIAGNOSIS — J189 Pneumonia, unspecified organism: Secondary | ICD-10-CM | POA: Insufficient documentation

## 2023-05-26 DIAGNOSIS — Q7649 Other congenital malformations of spine, not associated with scoliosis: Secondary | ICD-10-CM | POA: Insufficient documentation

## 2023-05-26 NOTE — Progress Notes (Signed)
Cardiology Office Note:    Date:  05/28/2023   ID:  DAIA HEBNER, DOB 25-Mar-1956, MRN [id number]  PCP:  Merri Brunette, MD  Cardiologist:  Norman Herrlich, MD   Referring MD: Merri Brunette, MD  ASSESSMENT:    1. Chest pain of uncertain etiology   2. Chest pain, unspecified type    PLAN:    In order of problems listed above:  She has developed a pattern of chronic chest pain on 1 hand and seems to be typical costochondral pain syndrome on the other hand there are anginal elements to her pain syndrome I think she deserves further evaluation we will plan on cardiac CTA and also guide if she needs lipid-lowering therapy I would like to have given her a short course of nonsteroidal but she is allergic will use Tylenol over the counter and of her cardiac CTA is for we could refer to physical therapy modalities  Next appointment 6 weeks   Medication Adjustments/Labs and Tests Ordered: Current medicines are reviewed at length with the patient today.  Concerns regarding medicines are outlined above.  Orders Placed This Encounter  Procedures   EKG 12-Lead   No orders of the defined types were placed in this encounter.    No chief complaint on file.   History of Present Illness:    [name] [name] is a 68 y.o. female who is being seen today for the evaluation of chest pain at the request of Merri Brunette, MD.  She had an echocardiogram in 2004 described as normal left trickle size wall thickness and function with no abnormality.  She  had an abnormal EKG in 2017 showing sinus rhythm with what I would interpret is incomplete right bundle branch block and marked T wave changes repolarization versus ischemia.  Another EKG [facility] October 2020 was also abnormal interpretation is sinus rhythm T wave abnormality anterior ischemia 03/11/2019.  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 personal care and she developed a recurrent pattern of chest pain some of it seen clearly costochondral with tenderness and localized pain but there is an element of this that is a more deep bruising discomfort she does clutch fist to her chest to describe it a Lenis Noon sign that occurs with activities like housecleaning and resolves with rest and seems to be anginal in nature Except for age she has a paucity of rich fat risk factors never smoked nondiabetic no hypertension actually had a very favorable lipid profile in August with an LDL of 86 cholesterol 157 W0J 6.3% she has normal renal function and has no dye allergy.    Plan and a short course of nonsteroidal anti-inflammatory drug but she is allergic to NSAIDs Take Tylenol twice a day and if symptoms persist refer to physical therapy modalities  No known history of heart disease congenital rheumatic or atrial fibrillation  She is not having palpitation edema orthopnea shortness of breath or syncope  Past Medical History:  Diagnosis Date   Anxiety    CALLUSES, LEFT FOOT 11/07/2009   Qualifier: Diagnosis of   By: Severiano Gilbert MD, Lawson Fiscal         DDD (degenerative disc disease), lumbar    BACK, FINGER, WRIST   DDD (degenerative disc disease), lumbar    Depression    Diverticulitis    Dizziness 01/08/2015  Dyspnea    Dysrhythmia    FASCIITIS, PLANTAR 11/07/2009   Qualifier: Diagnosis of   By: Severiano Gilbert MD, Lawson Fiscal         Generalized abdominal pain 01/08/2015   GERD (gastroesophageal reflux disease)    Headache    History of colon polyps 05/21/2023   Hypothyroidism    Irregular heart rate    Obesity 01/08/2015   Pneumonia    hx   Polyp of ascending colon 05/21/2023   Primary localized osteoarthritis of left hip 11/05/2016   Sleep apnea    unable to wear mask-sleep study 3-4 yrs ago   Spine malformation    Kiari Malformation   Thyroid disease    Wears dentures    UPPER    Past  Surgical History:  Procedure Laterality Date   ABDOMINAL HYSTERECTOMY  68 years old   APPENDECTOMY  68 years old   BACK SURGERY  1992   l9 and l5   CARPAL TUNNEL RELEASE Bilateral 2012   COLONOSCOPY WITH PROPOFOL N/A 08/16/2015   Procedure: COLONOSCOPY WITH PROPOFOL;  Surgeon: Midge Minium, MD;  Location: [facility];  Service: Endoscopy;  Laterality: N/A;   COLONOSCOPY WITH PROPOFOL N/A 05/21/2023   Procedure: COLONOSCOPY WITH PROPOFOL;  Surgeon: Midge Minium, MD;  Location: [facility];  Service: Endoscopy;  Laterality: N/A;   CRANIECTOMY SUBOCCIPITAL W/ CERVICAL LAMINECTOMY / CHIARI  08   JOINT REPLACEMENT     LAPAROSCOPIC SIGMOID COLECTOMY N/A 09/06/2015   Procedure: open sigmoid and descending colectomy, lysis of adhesions, omenteum flap;  Surgeon: Leafy Ro, MD;  Location: [facility] ORS;  Service: General;  Laterality: N/A;   POLYPECTOMY  08/16/2015   Procedure: POLYPECTOMY;  Surgeon: Midge Minium, MD;  Location: [facility];  Service: Endoscopy;;   POLYPECTOMY  05/21/2023   Procedure: POLYPECTOMY;  Surgeon: Midge Minium, MD;  Location: [facility] END[facility];  Service: Endoscopy;;   SPINAL FUSION     TONSILLECTOMY  as child   TOTAL HIP ARTHROPLASTY Right 01/12/2013   Procedure: TOTAL HIP ARTHROPLASTY WITH AUTOGRAFT;  Surgeon: Loreta Ave, MD;  Location: [facility] OR;  Service: Orthopedics;  Laterality: Right;   TOTAL HIP ARTHROPLASTY Left 11/05/2016   Procedure: LEFT TOTAL HIP ARTHROPLASTY ANTERIOR APPROACH;  Surgeon: Loreta Ave, MD;  Location: [facility] OR;  Service: Orthopedics;  Laterality: Left;    Current Medications: Current Meds  Medication Sig   acetaminophen (TYLENOL) 325 MG tablet Take 975 mg by mouth every 8 (eight) hours as needed for mild pain (pain score 1-3). Take three 325 mg (975 mg) every 8 hours as needed for mild pain   ARIPiprazole (ABILIFY) 5 MG tablet Take 5 mg by mouth daily.   Aspirin-Salicylamide-Caffeine (BC HEADACHE POWDER PO) Take 1 packet by mouth as  needed (headache).   cyclobenzaprine (FLEXERIL) 10 MG tablet Take 10 mg by mouth 3 (three) times daily as needed.   DULoxetine (CYMBALTA) 60 MG capsule Take 120 mg by mouth daily.    fenofibrate 160 MG tablet Take 160 mg by mouth daily.    levothyroxine (SYNTHROID, LEVOTHROID) 137 MCG tablet Take 137 mcg by mouth daily.   traZODone (DESYREL) 150 MG tablet Take 150 mg by mouth at bedtime as needed.     Allergies:   Cephalexin; Morphine; Naprosyn [naproxen]; Antihistamines, diphenhydramine-type; and Diphenhydramine hcl   Social History   Socioeconomic History   Marital status: Single    Spouse name: Not on file   Number of children: Not on file   Years of education:  Not on file   Highest education level: Not on file  Occupational History   Not on file  Tobacco Use   Smoking status: Never   Smokeless tobacco: Never   Tobacco comments:    2004 last crack   occ alcohol  Vaping Use   Vaping status: Never Used  Substance and Sexual Activity   Alcohol use: Yes    Comment: 2 beers a year   Drug use: Yes    Frequency: 7.0 times per week    Types: Marijuana    Comment: quit crack 12 years ago. marijuana use for sleep   Sexual activity: Not on file  Other Topics Concern   Not on file  Social History Narrative   Not on file   Social Drivers of Health   Financial Resource Strain: Not on file  Food Insecurity: Not on file  Transportation Needs: Not on file  Physical Activity: Not on file  Stress: Not on file  Social Connections: Unknown (09/20/2021)   [facility]   Social Network    Social Network: Not on file     Family History: The patient's family history includes COPD in her mother; Cancer in her father, maternal grandfather, and maternal grandmother; Stroke in her mother.  ROS:   ROS Please see the history of present illness.     All other systems reviewed and are negative.  EKGs/Labs/Other Studies Reviewed:    The following studies  were reviewed today:  EKG Interpretation Date/Time:  Thursday May 28 2023 11:05:46 EST Ventricular Rate:  75 PR Interval:  182 QRS Duration:  80 QT Interval:  386 QTC Calculation: 431 R Axis:   46  Text Interpretation: Normal sinus rhythm RSR prime T wave abnormality, consider anterolateral ischemia When compared with ECG of 01-Jul-2015 14:52, No significant change was found Confirmed by Norman Herrlich ([id number]) on 05/28/2023 11:11:30 AM     Physical Exam:    VS:  BP 112/68   Pulse 75   Ht 5\' 3"  (1.6 m)   Wt 215 lb 6.4 oz (97.7 kg)   SpO2 93%   BMI 38.16 kg/m     Wt Readings from Last 3 Encounters:  05/28/23 215 lb 6.4 oz (97.7 kg)  05/21/23 212 lb (96.2 kg)  07/04/22 211 lb (95.7 kg)     GEN:  Well nourished, well developed in no acute distress HEENT: Normal NECK: No JVD; No carotid bruits LYMPHATICS: No lymphadenopathy CARDIAC: RRR, no murmurs, rubs, gallops RESPIRATORY:  Clear to auscultation without rales, wheezing or rhonchi  ABDOMEN: Soft, non-tender, non-distended MUSCULOSKELETAL:  No edema; No deformity  SKIN: Warm and dry NEUROLOGIC:  Alert and oriented x 3 PSYCHIATRIC:  Normal affect     Signed, Norman Herrlich, MD  05/28/2023 11:24 AM    [facility]

## 2023-05-28 ENCOUNTER — Ambulatory Visit: Payer: Medicare Other | Attending: Cardiology | Admitting: Cardiology

## 2023-05-28 ENCOUNTER — Other Ambulatory Visit: Payer: Self-pay

## 2023-05-28 ENCOUNTER — Encounter: Payer: Self-pay | Admitting: Cardiology

## 2023-05-28 VITALS — BP 112/68 | HR 75 | Ht 63.0 in | Wt 215.4 lb

## 2023-05-28 DIAGNOSIS — Z972 Presence of dental prosthetic device (complete) (partial): Secondary | ICD-10-CM

## 2023-05-28 DIAGNOSIS — Q7649 Other congenital malformations of spine, not associated with scoliosis: Secondary | ICD-10-CM

## 2023-05-28 DIAGNOSIS — R079 Chest pain, unspecified: Secondary | ICD-10-CM | POA: Diagnosis not present

## 2023-05-28 DIAGNOSIS — R072 Precordial pain: Secondary | ICD-10-CM

## 2023-05-28 DIAGNOSIS — F419 Anxiety disorder, unspecified: Secondary | ICD-10-CM

## 2023-05-28 MED ORDER — METOPROLOL TARTRATE 100 MG PO TABS: 100.0000 mg | ORAL_TABLET | Freq: Once | ORAL | 0 refills | Status: DC

## 2023-05-28 NOTE — Patient Instructions (Signed)
Medication Instructions:  Your physician recommends that you continue on your current medications as directed. Please refer to the Current Medication list given to you today.  *If you need a refill on your cardiac medications before your next appointment, please call your pharmacy*   Lab Work: Your physician recommends that you return for lab work in:   Labs today: BMP  If you have labs (blood work) drawn today and your tests are completely normal, you will receive your results only by: MyChart Message (if you have MyChart) OR A paper copy in the mail If you have any lab test that is abnormal or we need to change your treatment, we will call you to review the results.   Testing/Procedures:   Your cardiac CT will be scheduled at one of the below locations:   [facility] [address] [place], [place] [address] [phone]  OR  [facility] [address] [place], [place] [address] [phone]  OR   [facility] [address] [place], [place] [address] [phone]  OR   [facility] [address] [place], [place] [address] [phone]  If scheduled at [facility], please arrive at the [facility] Entrance (Entrance C2) of [facility] 30 minutes prior to test start time. You can use the FREE valet parking offered at entrance C (encouraged to control the heart rate for the test)  Proceed to the [facility] Radiology Department (first floor) to check-in and test prep.  All radiology patients and guests should use entrance C2 at [facility], accessed from [facility], even though the hospital's physical address listed is [address]    If scheduled at [facility] or [facility], please arrive 15 mins early for check-in and test prep.  There is spacious parking  and easy access to the radiology department from the [facility] entrance. Please enter here and check-in with the desk attendant.   Please follow these instructions carefully (unless otherwise directed):  An IV will be required for this test and Nitroglycerin will be given.  Hold all erectile dysfunction medications at least 3 days (72 hrs) prior to test. (Ie viagra, cialis, sildenafil, tadalafil, etc)   On the Night Before the Test: Be sure to Drink plenty of water. Do not consume any caffeinated/decaffeinated beverages or chocolate 12 hours prior to your test. Do not take any antihistamines 12 hours prior to your test.  On the Day of the Test: Drink plenty of water until 1 hour prior to the test. Do not eat any food 1 hour prior to test. You may take your regular medications prior to the test.  Take metoprolol (Lopressor) two hours prior to test. FEMALES- please wear underwire-free bra if available, avoid dresses & tight clothing      After the Test: Drink plenty of water. After receiving IV contrast, you may experience a mild flushed feeling. This is normal. On occasion, you may experience a mild rash up to 24 hours after the test. This is not dangerous. If this occurs, you can take Benadryl 25 mg and increase your fluid intake. If you experience trouble breathing, this can be serious. If it is severe call 911 IMMEDIATELY. If it is mild, please call our office.  We will call to schedule your test 2-4 weeks out understanding that some insurance companies will need an authorization  prior to the service being performed.   For more information and frequently asked questions, please visit our website : [email or web address]  For non-scheduling related questions, please contact the cardiac imaging nurse navigator should you have any questions/concerns: Cardiac Imaging Nurse Navigators Direct Office Dial: [phone]   For scheduling needs, including  cancellations and rescheduling, please call [place], [phone].    Follow-Up: At [facility], you and your health needs are our priority.  As part of our continuing mission to provide you with exceptional heart care, we have created designated Provider Care Teams.  These Care Teams include your primary Cardiologist (physician) and Advanced Practice Providers (APPs -  Physician Assistants and Nurse Practitioners) who all work together to provide you with the care you need, when you need it.  We recommend signing up for the patient portal called "MyChart".  Sign up information is provided on this After Visit Summary.  MyChart is used to connect with patients for Virtual Visits (Telemedicine).  Patients are able to view lab/test results, encounter notes, upcoming appointments, etc.  Non-urgent messages can be sent to your provider as well.   To learn more about what you can do with MyChart, go to [email or web address].    Your next appointment:   6 week(s)  Provider:   Norman Herrlich, MD    Other Instructions Take Tylenol 2 tablets OTC

## 2023-05-29 LAB — BASIC METABOLIC PANEL
BUN/Creatinine Ratio: 14 (ref 12–28)
BUN: 17 mg/dL (ref 8–27)
CO2: 21 mmol/L (ref 20–29)
Calcium: 10.1 mg/dL (ref 8.7–10.3)
Chloride: 105 mmol/L (ref 96–106)
Creatinine, Ser: 1.22 mg/dL — ABNORMAL HIGH (ref 0.57–1.00)
Glucose: 73 mg/dL (ref 70–99)
Potassium: 4.5 mmol/L (ref 3.5–5.2)
Sodium: 145 mmol/L — ABNORMAL HIGH (ref 134–144)
eGFR: 49 mL/min/{1.73_m2} — ABNORMAL LOW (ref >59)

## 2023-06-15 ENCOUNTER — Telehealth (HOSPITAL_COMMUNITY): Payer: Self-pay | Admitting: *Deleted

## 2023-06-15 DIAGNOSIS — M546 Pain in thoracic spine: Secondary | ICD-10-CM | POA: Diagnosis not present

## 2023-06-15 DIAGNOSIS — E039 Hypothyroidism, unspecified: Secondary | ICD-10-CM | POA: Diagnosis not present

## 2023-06-15 DIAGNOSIS — W19XXXA Unspecified fall, initial encounter: Secondary | ICD-10-CM | POA: Diagnosis not present

## 2023-06-15 NOTE — Telephone Encounter (Signed)
 Reaching out to patient to offer assistance regarding upcoming cardiac imaging study; pt verbalizes understanding of appt date/time, parking situation and where to check in, pre-test NPO status and medications ordered, and verified current allergies; name and call back number provided for further questions should they arise  Larey Brick RN Navigator Cardiac Imaging [facility] [phone] office [phone] cell  Patient to take 100mg  metoprolol tartrate two hours prior to her cardiac CT scan. She is aware to arrive at 1:30 PM.

## 2023-06-16 ENCOUNTER — Ambulatory Visit (HOSPITAL_COMMUNITY)
Admission: RE | Admit: 2023-06-16 | Discharge: 2023-06-16 | Disposition: A | Discharge status: Home / Self Care | Payer: Medicare Other | Source: Ambulatory Visit | Attending: Cardiology | Admitting: Cardiology

## 2023-06-16 DIAGNOSIS — R072 Precordial pain: Secondary | ICD-10-CM

## 2023-06-16 MED ORDER — NITROGLYCERIN 0.4 MG SL SUBL
SUBLINGUAL_TABLET | SUBLINGUAL | Status: AC
  Filled 2023-06-16: qty 2

## 2023-06-16 MED ORDER — IOHEXOL 350 MG/ML SOLN
95.0000 mL | Freq: Once | INTRAVENOUS | Status: AC | PRN
  Administered 2023-06-16: 95 mL via INTRAVENOUS

## 2023-06-16 MED ORDER — NITROGLYCERIN 0.4 MG SL SUBL
0.8000 mg | SUBLINGUAL_TABLET | Freq: Once | SUBLINGUAL | Status: AC
  Administered 2023-06-16: 0.8 mg via SUBLINGUAL

## 2023-06-18 ENCOUNTER — Other Ambulatory Visit: Payer: Self-pay

## 2023-06-18 ENCOUNTER — Telehealth: Payer: Self-pay

## 2023-06-18 DIAGNOSIS — R079 Chest pain, unspecified: Secondary | ICD-10-CM

## 2023-06-18 MED ORDER — ROSUVASTATIN CALCIUM 10 MG PO TABS: 10.0000 mg | ORAL_TABLET | Freq: Every day | ORAL | 3 refills | Status: AC

## 2023-06-18 NOTE — Telephone Encounter (Signed)
 Received the following message below from Dr. Monetta:  On her cardiac CTA they commented on fluid in the sac about the heart I spoke to the interpreting physician he felt it was more than mild and recommends we get a cardiac echo done in the office. The CTA showed mild atherosclerosis in the coronary arteries no severe blockage but she should be taking a statin medication I would have her start taking rosuvastatin  10 mg daily.   Called patient and informed her of Dr. Leandrew recommendation to have an echo performed and to start on the Rosuvastatin  medication. Patient verbalized understanding and had no further questions at this time. The rosuvastatin  medication was ordered via Epic and sent to her pharmacy. The echo was ordered via Epic.

## 2023-06-30 DIAGNOSIS — H40003 Preglaucoma, unspecified, bilateral: Secondary | ICD-10-CM | POA: Diagnosis not present

## 2023-06-30 DIAGNOSIS — H2513 Age-related nuclear cataract, bilateral: Secondary | ICD-10-CM | POA: Diagnosis not present

## 2023-07-06 ENCOUNTER — Other Ambulatory Visit: Payer: Self-pay

## 2023-07-06 DIAGNOSIS — R079 Chest pain, unspecified: Secondary | ICD-10-CM

## 2023-07-06 DIAGNOSIS — R0602 Shortness of breath: Secondary | ICD-10-CM

## 2023-07-08 DIAGNOSIS — G4700 Insomnia, unspecified: Secondary | ICD-10-CM | POA: Diagnosis not present

## 2023-07-08 DIAGNOSIS — Z23 Encounter for immunization: Secondary | ICD-10-CM | POA: Diagnosis not present

## 2023-07-08 DIAGNOSIS — N1831 Chronic kidney disease, stage 3a: Secondary | ICD-10-CM | POA: Diagnosis not present

## 2023-07-08 DIAGNOSIS — E785 Hyperlipidemia, unspecified: Secondary | ICD-10-CM | POA: Diagnosis not present

## 2023-07-08 DIAGNOSIS — E039 Hypothyroidism, unspecified: Secondary | ICD-10-CM | POA: Diagnosis not present

## 2023-07-08 DIAGNOSIS — K219 Gastro-esophageal reflux disease without esophagitis: Secondary | ICD-10-CM | POA: Diagnosis not present

## 2023-07-08 LAB — COMPREHENSIVE METABOLIC PANEL
Calcium: 9.2
EGFR: 57

## 2023-07-08 NOTE — Progress Notes (Unsigned)
 Cardiology Office Note:  .   Date:  07/09/2023  ID:  [name] [name], DOB 12/31/1955, MRN [id number] PCP: Merri Brunette, MD  [facility] Providers Cardiologist:  Norman Herrlich, MD    History of Present Illness: .   [name] [name] is a 68 y.o. female the past medical history of nonobstructive CAD per coronary CTA in 2025, dyslipidemia, hypothyroidism.  06/16/2023 coronary CTA calcium score 55.5, 73rd percentile, total plaque volume categorized as mild plaque burden and circumferential pericardial effusion  She established care with Dr. Dulce Sellar on 05/28/2023 at the behest of her PCP for evaluation of chest pain.  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  This was completed 07/01/2023 which revealed nonobstructive CAD as well as circumferential pericardial effusion with recommendation for repeat chest x-ray and echocardiogram.  She presents today for follow-up of her nonobstructive CAD.  She feels her previous episodes of chest pain were associated with her consuming energy drinks, she has since stopped these.  She was also caring for her husband who had undergone a BKA and she was having to lift him quite a bit.  She continues to have some chest pain that is exquisitely tender to palpation.  We reviewed her coronary CTA, and its implications, also the unexpected finding of a pericardial effusion.  She denies any recent viral illnesses.  An echocardiogram had already been arranged and will be completed on 07/14/2023.  She denies chest pain, palpitations, dyspnea, pnd, orthopnea, n, v, dizziness, syncope, edema, weight gain, or early satiety.   ROS: Review of Systems  Cardiovascular:  Positive for chest pain.  Musculoskeletal:  Positive for joint pain.  All other systems reviewed and are negative.    Studies  Reviewed: .        Cardiac Studies & Procedures   ______________________________________________________________________________________________          CT SCANS  CT CORONARY MORPH W/CTA COR W/SCORE 06/16/2023  Addendum 07/01/2023  2:04 AM ADDENDUM REPORT: 07/01/2023 02:02  EXAM: OVER-READ INTERPRETATION  CT CHEST  The following report is an over-read performed by radiologist Dr. Alcide Clever of [facility] on 07/01/2023. This over-read does not include interpretation of cardiac or coronary anatomy or pathology. The coronary calcium score/coronary CTA interpretation by the cardiologist is attached.  COMPARISON:  None.  FINDINGS: Cardiovascular: Small pericardial effusion is noted.  Mediastinum/Nodes: There are no enlarged lymph nodes within the visualized mediastinum.  Lungs/Pleura: Small left pleural effusion is noted. Mild left upper lobe and left lower lobe atelectatic changes are seen.  Upper abdomen: No significant findings in the visualized upper abdomen.  Musculoskeletal/Chest wall: No chest wall mass or suspicious osseous findings within the visualized chest.  IMPRESSION: Left pleural effusion and associated atelectatic changes.  Small pericardial effusion.   Electronically Signed By: Alcide Clever M.D. On: 07/01/2023 02:02  Narrative HISTORY: Chest pain, nonspecific  EXAM: Cardiac/Coronary  CT  TECHNIQUE: The patient was scanned on a [employer].  PROTOCOL: A non-contrast, gated CT scan was obtained with axial slices of 3 mm through the heart for calcium scoring. Calcium scoring was performed using the Agatston method. A 120 kV prospective, gated, contrast cardiac scan was obtained. Gantry rotation speed was 250 msecs and collimation was 0.6 mm. Two sublingual nitroglycerin tablets (0.8 mg) were given. The 3 D data set was reconstructed  in 5% intervals of the 35-75% of the R-R cycle. Diastolic phases were analyzed on  a dedicated workstation using MPR, MIP, and VRT modes. The patient received 95 cc of contrast.  FINDINGS: Image quality: Average.  Artifact: Limited.  Coronary artery calcification score:  Left main: 0  Left anterior descending artery: 55.5  Left circumflex artery: 0  Right coronary artery: 0  Total coronary calcium score of 55.5, places the patient at the 73rd percentile for age and sex matched control.  Coronary arteries: Normal coronary origins.  Right dominance.  Left Main Coronary Artery: Normal caliber vessel, originates from the left coronary cusp, bifurcates to form a left anterior descending artery (LAD) and a left circumflex artery (LCX). There is no plaque or stenosis.  Left Anterior Descending Artery: Normal caliber vessel, wraps the apex, gives off 2 diagonal branches. Minimal calcified and non-calcified plaque (0-24%) within the proximal to mid LAD. Mid to apical segments are patent with some luminal irregularities due to soft plaque.  First Diagonal branch: Patent.  Second Diagonal branch: Patent.  Left Circumflex Artery: Normal caliber vessel, non-dominant, travels within the atrioventricular groove, gives off 2 obtuse marginal branches. The LCX is patent.  First Obtuse Marginal branch: Patent.  Second Obtuse Marginal branch: Patent.  Right Coronary Artery: Dominant vessel, originates from the right coronary cusp, terminates as a PDA and right posterolateral branch. RCA is patent.  Plaque Analysis:  Calcified plaque 17 mm3  Non-calcified plaque 49 mm3 (low attenuation plaque 1 mm3).  Total plaque volume is 66 mm3, categorized as mild plaque burden, which is 29th percentile for age- and sex-matched controls.  Left Atrium: Grossly normal in size with no left atrial appendage filling defect.  Left Ventricle: Grossly normal in size. There is no abnormal filling defect.  Pulmonary arteries: Normal in size without proximal filling  defect.  Pulmonary veins: Normal pulmonary venous drainage.  Aorta: Normal size, 32.5 mm at the mid ascending aorta (level of the PA bifurcation) measured double oblique. No calcifications. No dissection.  Pericardium: Circumferential pericardial effusion.  Aortic Valve:Trileaflet without significant calcification.  Mitral Valve: Normal structure without significant calcification.  Extra-cardiac findings: See attached radiology report for non-cardiac structures.  IMPRESSION: 1. Coronary calcium score of 55.5. This was 73rd percentile for age-, sex, and race-matched controls.  2. Total plaque volume is 66 mm3, categorized as mild plaque burden, which is 29th percentile for age- and sex-matched controls.  3. Normal coronary origin with right dominance.  4. CAD-RADS = 1 Minimal non-obstructive CAD.  Left Main: Patent.  LAD: Minimal calcified and non-calcified plaque (0-24%) within the proximal to mid LAD.  LCX: Patent.  RCA: Patent.  5. Circumferential pericardial effusion.  RECOMMENDATIONS:  1. Consider non-atherosclerotic causes of chest pain. Consider preventive therapy and risk factor modification.  2. Recommend echocardiogram to further evaluate the pericardial effusion.  Electronically Signed: By: Tessa Lerner D.O. On: 06/16/2023 22:48     ______________________________________________________________________________________________      Risk Assessment/Calculations:             Physical Exam:   VS:  BP (!) 104/58 (BP Location: Left Arm, Patient Position: Sitting, Cuff Size: Normal)   Pulse 95   Ht 5\' 3"  (1.6 m)   Wt 203 lb (92.1 kg)   SpO2 95%   BMI 35.96 kg/m    Wt Readings from Last 3 Encounters:  07/09/23 203 lb (92.1 kg)  05/28/23 215 lb 6.4 oz (97.7 kg)  05/21/23 212 lb (96.2 kg)  GEN: Well nourished, well developed in no acute distress NECK: No JVD; No carotid bruits CARDIAC: RRR, no murmurs, rubs, gallops RESPIRATORY:  Clear to  auscultation without rales, wheezing or rhonchi  ABDOMEN: Soft, non-tender, non-distended EXTREMITIES:  No edema; No deformity   ASSESSMENT AND PLAN: .   CAD-mild nonobstructive per coronary CTA.  Will start her on enteric-coated aspirin 81 mg daily.  Continue Crestor 10 mg daily, continue fenofibrate 160 mg daily.  Pericardial effusion-this was an unexpected finding on her coronary CTA.  Echocardiogram has been arranged and will be completed on 07/14/2023.  She has had a cough, she is not sure when this started.  She denies any further recent viral illnesses.  She is relatively asymptomatic from this.  Dyslipidemia-most recent LDL was elevated at 86, would prefer this to be less than 70.  Currently on Crestor 10 mg daily, fenofibrate 160 mg daily.  She states she just had lab work yesterday and she is not sure if her PCP checked this or not so we will reach out to them and request recent lab work, if not we will plan to repeat her LFTs and lipid panel, if not acceptable we will increase her Crestor to 20 mg each evening.  Costochondritis-she has chest pain that is exquisitely tender to palpation.  She was advised to take Tylenol at her last OV as it was thought she had an allergy to NSAIDs.  She is only allergic to naproxen.  She has been taking ibuprofen intermittently without any allergic reactions.  Will have her take ibuprofen 800 mg twice daily x 5 days to see if this helps with her symptoms, encouraged her to take this with a meal.       Dispo: Request lab work from PCP, start baby aspirin 81 mg daily, take ibuprofen 800 mg twice daily x 5 days with food.  Follow-up in 6 months--possibly sooner depending the results of her echo  Signed, Flossie Dibble, NP

## 2023-07-09 ENCOUNTER — Encounter: Payer: Self-pay | Admitting: Cardiology

## 2023-07-09 ENCOUNTER — Ambulatory Visit: Payer: Medicare Other | Attending: Cardiology | Admitting: Cardiology

## 2023-07-09 VITALS — BP 104/58 | HR 95 | Ht 63.0 in | Wt 203.0 lb

## 2023-07-09 DIAGNOSIS — M940 Chondrocostal junction syndrome [Tietze]: Secondary | ICD-10-CM

## 2023-07-09 DIAGNOSIS — E785 Hyperlipidemia, unspecified: Secondary | ICD-10-CM

## 2023-07-09 DIAGNOSIS — I2510 Atherosclerotic heart disease of native coronary artery without angina pectoris: Secondary | ICD-10-CM | POA: Diagnosis not present

## 2023-07-09 DIAGNOSIS — R079 Chest pain, unspecified: Secondary | ICD-10-CM | POA: Diagnosis not present

## 2023-07-09 DIAGNOSIS — I3139 Other pericardial effusion (noninflammatory): Secondary | ICD-10-CM | POA: Diagnosis not present

## 2023-07-09 NOTE — Patient Instructions (Addendum)
 Medication Instructions:   START: Aspirin 81mg  1 tablet day  TAKE: Ibuprofen 800mg  twice daily for 5 days  INCREASE: Water intake   Lab Work: CMP, Direct LDL, Lpa- today If you have labs (blood work) drawn today and your tests are completely normal, you will receive your results only by: [employer] (if you have MyChart) OR A paper copy in the mail If you have any lab test that is abnormal or we need to change your treatment, we will call you to review the results.   Testing/Procedures: None Ordered   Follow-Up: At [facility], you and your health needs are our priority.  As part of our continuing mission to provide you with exceptional heart care, we have created designated Provider Care Teams.  These Care Teams include your primary Cardiologist (physician) and Advanced Practice Providers (APPs -  Physician Assistants and Nurse Practitioners) who all work together to provide you with the care you need, when you need it.  We recommend signing up for the patient portal called "MyChart".  Sign up information is provided on this After Visit Summary.  MyChart is used to connect with patients for Virtual Visits (Telemedicine).  Patients are able to view lab/test results, encounter notes, upcoming appointments, etc.  Non-urgent messages can be sent to your provider as well.   To learn more about what you can do with MyChart, go to [email or web address].    Your next appointment:   6 month(s)  The format for your next appointment:   In Person  Provider:   Wallis Bamberg, NP   Other Instructions NA

## 2023-07-13 ENCOUNTER — Telehealth: Payer: Self-pay | Admitting: Cardiology

## 2023-07-13 ENCOUNTER — Other Ambulatory Visit: Payer: Medicare Other

## 2023-07-13 DIAGNOSIS — I2510 Atherosclerotic heart disease of native coronary artery without angina pectoris: Secondary | ICD-10-CM

## 2023-07-13 NOTE — Telephone Encounter (Signed)
 Called and spoke to patient and reviewed that Wallis Bamberg recommends that she come in for blood work. Patient verbalized understanding and had no further questions.

## 2023-07-15 ENCOUNTER — Ambulatory Visit: Payer: Medicare Other | Attending: Cardiology

## 2023-07-15 DIAGNOSIS — R079 Chest pain, unspecified: Secondary | ICD-10-CM

## 2023-07-15 LAB — ECHOCARDIOGRAM COMPLETE: S' Lateral: 3 cm

## 2023-07-16 ENCOUNTER — Telehealth: Payer: Self-pay

## 2023-07-16 NOTE — Telephone Encounter (Signed)
-----   Message from [facility] sent at 07/16/2023 10:09 AM EST ----- Good result heart muscle function ejection fraction is normal.

## 2023-07-16 NOTE — Telephone Encounter (Signed)
 Patient notified of results and verbalized understanding.

## 2023-10-19 ENCOUNTER — Encounter: Payer: Self-pay | Admitting: Family Medicine

## 2023-10-23 DIAGNOSIS — N1831 Chronic kidney disease, stage 3a: Secondary | ICD-10-CM | POA: Diagnosis not present

## 2023-11-09 DIAGNOSIS — N1831 Chronic kidney disease, stage 3a: Secondary | ICD-10-CM | POA: Diagnosis not present

## 2023-11-09 DIAGNOSIS — E039 Hypothyroidism, unspecified: Secondary | ICD-10-CM | POA: Diagnosis not present

## 2023-11-10 ENCOUNTER — Other Ambulatory Visit: Payer: Medicare Other

## 2023-11-21 DIAGNOSIS — N1831 Chronic kidney disease, stage 3a: Secondary | ICD-10-CM | POA: Diagnosis not present

## 2023-12-08 ENCOUNTER — Other Ambulatory Visit

## 2023-12-10 DIAGNOSIS — N1831 Chronic kidney disease, stage 3a: Secondary | ICD-10-CM | POA: Diagnosis not present

## 2023-12-10 DIAGNOSIS — E039 Hypothyroidism, unspecified: Secondary | ICD-10-CM | POA: Diagnosis not present

## 2023-12-21 DIAGNOSIS — N1831 Chronic kidney disease, stage 3a: Secondary | ICD-10-CM | POA: Diagnosis not present

## 2023-12-31 NOTE — Progress Notes (Deleted)
 Cardiology Office Note:    Date:  12/31/2023   ID:  [name] [name], DOB March 04, 1956, MRN [id number]  PCP:  Claudene Pellet, MD  Cardiologist:  Redell Leiter, MD    Referring MD: Claudene Pellet, MD    ASSESSMENT:    No diagnosis found. PLAN:    In order of problems listed above:  ***   Next appointment: ***   Medication Adjustments/Labs and Tests Ordered: Current medicines are reviewed at length with the patient today.  Concerns regarding medicines are outlined above.  No orders of the defined types were placed in this encounter.  No orders of the defined types were placed in this encounter.    History of Present Illness:    [name] [name] is a 68 y.o. female with a hx of chest pain appearing to be chest wall in etiology with right bundle branch block and a small pericardial effusion last seen 05/28/2023.  Cardiac CTA reported 06/16/2023 she had a coronary calcium  score of 55 73rd percentile and minimal nonobstructive CAD involving the LAD less than 25%.  There is also a small pericardial and left pleural effusion with atelectasis.  She subsequently had an echocardiogram showing normal left ventricular size function EF 60 to 65% no pericardial effusion was noted. Compliance with diet, lifestyle and medications: *** Past Medical History:  Diagnosis Date   Anxiety    CALLUSES, LEFT FOOT 11/07/2009   Qualifier: Diagnosis of   By: Mario MD, Katheryn         DDD (degenerative disc disease), lumbar    BACK, FINGER, WRIST   DDD (degenerative disc disease), lumbar    Depression    Diverticulitis    Dizziness 01/08/2015   Dyspnea    Dysrhythmia    FASCIITIS, PLANTAR 11/07/2009   Qualifier: Diagnosis of   By: Mario MD, Lori         Generalized abdominal pain 01/08/2015   GERD (gastroesophageal reflux disease)    Headache    History of colon polyps 05/21/2023   Hypothyroidism    Irregular heart rate    Obesity 01/08/2015   Pneumonia    hx   Polyp of ascending colon 05/21/2023    Primary localized osteoarthritis of left hip 11/05/2016   Sleep apnea    unable to wear mask-sleep study 3-4 yrs ago   Spine malformation    Kiari Malformation   Thyroid  disease    Wears dentures    UPPER    Current Medications: No outpatient medications have been marked as taking for the 01/05/24 encounter (Appointment) with Leiter Redell PARAS, MD.      EKGs/Labs/Other Studies Reviewed:    The following studies were reviewed today:  Cardiac Studies & Procedures   ______________________________________________________________________________________________     ECHOCARDIOGRAM  ECHOCARDIOGRAM COMPLETE 07/15/2023  Narrative ECHOCARDIOGRAM REPORT    Patient Name:   [name] [name] Benito Date of Exam: 07/15/2023 Medical Rec #:  [id number]     Height:       63.0 in Accession #:    [id number]    Weight:       203.0 lb Date of Birth:  Oct 16, 1955     BSA:          1.946 m Patient Age:    67 years      BP:           104/58 mmHg Patient Gender: F             HR:  74 bpm. Exam Location:  Circle  Procedure: 2D Echo, Cardiac Doppler, Color Doppler and Strain Analysis (Both Spectral and Color Flow Doppler were utilized during procedure).  Indications:    Chest pain of uncertain etiology [R07.9 (ICD-10-CM)]  History:        Patient has no prior history of Echocardiogram examinations. Signs/Symptoms:Obesity and Dizziness/Lightheadedness.  Sonographer:    Lynwood Silvas RDCS Referring Phys: [id number] Evelio Rueda J Shriya Aker  IMPRESSIONS   1. Left ventricular ejection fraction, by estimation, is 60 to 65%. The left ventricle has normal function. The left ventricle has no regional wall motion abnormalities. Left ventricular diastolic parameters are consistent with Grade I diastolic dysfunction (impaired relaxation). The average left ventricular global longitudinal strain is -16.5 %. The global longitudinal strain is abnormal. 2. Right ventricular systolic function is normal. The right ventricular  size is normal. There is normal pulmonary artery systolic pressure. 3. The mitral valve is normal in structure. No evidence of mitral valve regurgitation. No evidence of mitral stenosis. 4. The aortic valve is normal in structure. Aortic valve regurgitation is not visualized. No aortic stenosis is present. 5. The inferior vena cava is normal in size with greater than 50% respiratory variability, suggesting right atrial pressure of 3 mmHg.  FINDINGS Left Ventricle: Left ventricular ejection fraction, by estimation, is 60 to 65%. The left ventricle has normal function. The left ventricle has no regional wall motion abnormalities. The average left ventricular global longitudinal strain is -16.5 %. Strain was performed and the global longitudinal strain is abnormal. The left ventricular internal cavity size was normal in size. There is no left ventricular hypertrophy. Left ventricular diastolic parameters are consistent with Grade I diastolic dysfunction (impaired relaxation).  Right Ventricle: The right ventricular size is normal. No increase in right ventricular wall thickness. Right ventricular systolic function is normal. There is normal pulmonary artery systolic pressure. The tricuspid regurgitant velocity is 2.05 m/s, and with an assumed right atrial pressure of 3 mmHg, the estimated right ventricular systolic pressure is 19.8 mmHg.  Left Atrium: Left atrial size was normal in size.  Right Atrium: Right atrial size was normal in size.  Pericardium: There is no evidence of pericardial effusion.  Mitral Valve: The mitral valve is normal in structure. No evidence of mitral valve regurgitation. No evidence of mitral valve stenosis.  Tricuspid Valve: The tricuspid valve is normal in structure. Tricuspid valve regurgitation is not demonstrated. No evidence of tricuspid stenosis.  Aortic Valve: The aortic valve is normal in structure. Aortic valve regurgitation is not visualized. No aortic stenosis  is present.  Pulmonic Valve: The pulmonic valve was normal in structure. Pulmonic valve regurgitation is not visualized. No evidence of pulmonic stenosis.  Aorta: The aortic root is normal in size and structure.  Venous: The inferior vena cava is normal in size with greater than 50% respiratory variability, suggesting right atrial pressure of 3 mmHg.  IAS/Shunts: No atrial level shunt detected by color flow Doppler.  Additional Comments: 3D was performed not requiring image post processing on an independent workstation and was indeterminate.   LEFT VENTRICLE PLAX 2D LVIDd:         4.40 cm   Diastology LVIDs:         3.00 cm   LV e' medial:    6.64 cm/s LV PW:         1.10 cm   LV E/e' medial:  9.2 LV IVS:        1.10 cm   LV e' lateral:  6.20 cm/s LVOT diam:     2.10 cm   LV E/e' lateral: 9.8 LV SV:         64 LV SV Index:   33        2D Longitudinal Strain LVOT Area:     3.46 cm  2D Strain GLS Avg:     -16.5 %   RIGHT VENTRICLE            IVC RV Basal diam:  2.60 cm    IVC diam: 1.60 cm RV S prime:     8.05 cm/s TAPSE (M-mode): 1.8 cm  LEFT ATRIUM             Index        RIGHT ATRIUM           Index LA diam:        2.80 cm 1.44 cm/m   RA Area:     12.30 cm LA Vol (A2C):   27.7 ml 14.23 ml/m  RA Volume:   24.80 ml  12.74 ml/m LA Vol (A4C):   25.6 ml 13.16 ml/m LA Biplane Vol: 26.8 ml 13.77 ml/m AORTIC VALVE LVOT Vmax:   94.05 cm/s LVOT Vmean:  63.400 cm/s LVOT VTI:    0.186 m  AORTA Ao Root diam: 3.20 cm Ao Asc diam:  3.40 cm Ao Desc diam: 2.40 cm  MV E velocity: 60.80 cm/s  TRICUSPID VALVE MV A velocity: 90.95 cm/s  TR Peak grad:   16.8 mmHg MV E/A ratio:  0.67        TR Vmax:        205.00 cm/s  SHUNTS Systemic VTI:  0.19 m Systemic Diam: 2.10 cm  Jennifer Crape MD Electronically signed by Jennifer Crape MD Signature Date/Time: 07/15/2023/2:00:09 PM    Final      CT SCANS  CT CORONARY MORPH W/CTA COR W/SCORE 06/16/2023  Addendum 07/01/2023   2:04 AM ADDENDUM REPORT: 07/01/2023 02:02  EXAM: OVER-READ INTERPRETATION  CT CHEST  The following report is an over-read performed by radiologist Dr. Oneil Devonshire of [facility] on 07/01/2023. This over-read does not include interpretation of cardiac or coronary anatomy or pathology. The coronary calcium  score/coronary CTA interpretation by the cardiologist is attached.  COMPARISON:  None.  FINDINGS: Cardiovascular: Small pericardial effusion is noted.  Mediastinum/Nodes: There are no enlarged lymph nodes within the visualized mediastinum.  Lungs/Pleura: Small left pleural effusion is noted. Mild left upper lobe and left lower lobe atelectatic changes are seen.  Upper abdomen: No significant findings in the visualized upper abdomen.  Musculoskeletal/Chest wall: No chest wall mass or suspicious osseous findings within the visualized chest.  IMPRESSION: Left pleural effusion and associated atelectatic changes.  Small pericardial effusion.   Electronically Signed By: Oneil Devonshire M.D. On: 07/01/2023 02:02  Narrative HISTORY: Chest pain, nonspecific  EXAM: Cardiac/Coronary  CT  TECHNIQUE: The patient was scanned on a [employer].  PROTOCOL: A non-contrast, gated CT scan was obtained with axial slices of 3 mm through the heart for calcium  scoring. Calcium  scoring was performed using the Agatston method. A 120 kV prospective, gated, contrast cardiac scan was obtained. Gantry rotation speed was 250 msecs and collimation was 0.6 mm. Two sublingual nitroglycerin  tablets (0.8 mg) were given. The 3 D data set was reconstructed in 5% intervals of the 35-75% of the R-R cycle. Diastolic phases were analyzed on a dedicated workstation using MPR, MIP, and VRT modes. The patient received 95 cc of contrast.  FINDINGS: Image quality: Average.  Artifact: Limited.  Coronary artery calcification score:  Left main: 0  Left anterior descending  artery: 55.5  Left circumflex artery: 0  Right coronary artery: 0  Total coronary calcium  score of 55.5, places the patient at the 73rd percentile for age and sex matched control.  Coronary arteries: Normal coronary origins.  Right dominance.  Left Main Coronary Artery: Normal caliber vessel, originates from the left coronary cusp, bifurcates to form a left anterior descending artery (LAD) and a left circumflex artery (LCX). There is no plaque or stenosis.  Left Anterior Descending Artery: Normal caliber vessel, wraps the apex, gives off 2 diagonal branches. Minimal calcified and non-calcified plaque (0-24%) within the proximal to mid LAD. Mid to apical segments are patent with some luminal irregularities due to soft plaque.  First Diagonal branch: Patent.  Second Diagonal branch: Patent.  Left Circumflex Artery: Normal caliber vessel, non-dominant, travels within the atrioventricular groove, gives off 2 obtuse marginal branches. The LCX is patent.  First Obtuse Marginal branch: Patent.  Second Obtuse Marginal branch: Patent.  Right Coronary Artery: Dominant vessel, originates from the right coronary cusp, terminates as a PDA and right posterolateral branch. RCA is patent.  Plaque Analysis:  Calcified plaque 17 mm3  Non-calcified plaque 49 mm3 (low attenuation plaque 1 mm3).  Total plaque volume is 66 mm3, categorized as mild plaque burden, which is 29th percentile for age- and sex-matched controls.  Left Atrium: Grossly normal in size with no left atrial appendage filling defect.  Left Ventricle: Grossly normal in size. There is no abnormal filling defect.  Pulmonary arteries: Normal in size without proximal filling defect.  Pulmonary veins: Normal pulmonary venous drainage.  Aorta: Normal size, 32.5 mm at the mid ascending aorta (level of the PA bifurcation) measured double oblique. No calcifications. No dissection.  Pericardium: Circumferential  pericardial effusion.  Aortic Valve:Trileaflet without significant calcification.  Mitral Valve: Normal structure without significant calcification.  Extra-cardiac findings: See attached radiology report for non-cardiac structures.  IMPRESSION: 1. Coronary calcium  score of 55.5. This was 73rd percentile for age-, sex, and race-matched controls.  2. Total plaque volume is 66 mm3, categorized as mild plaque burden, which is 29th percentile for age- and sex-matched controls.  3. Normal coronary origin with right dominance.  4. CAD-RADS = 1 Minimal non-obstructive CAD.  Left Main: Patent.  LAD: Minimal calcified and non-calcified plaque (0-24%) within the proximal to mid LAD.  LCX: Patent.  RCA: Patent.  5. Circumferential pericardial effusion.  RECOMMENDATIONS:  1. Consider non-atherosclerotic causes of chest pain. Consider preventive therapy and risk factor modification.  2. Recommend echocardiogram to further evaluate the pericardial effusion.  Electronically Signed: By: Madonna Large D.O. On: 06/16/2023 22:48     ______________________________________________________________________________________________          Recent Labs: 05/28/2023: BUN 17; Creatinine, Ser 1.22; Potassium 4.5; Sodium 145  Recent Lipid Panel    Component Value Date/Time   CHOL 191 01/18/2011 0333   TRIG 301 (H) 01/18/2011 0333   HDL 30 (L) 01/18/2011 0333   CHOLHDL 6.4 01/18/2011 0333   VLDL 60 (H) 01/18/2011 0333   LDLCALC 101 (H) 01/18/2011 9666    Physical Exam:    VS:  There were no vitals taken for this visit.    Wt Readings from Last 3 Encounters:  07/09/23 203 lb (92.1 kg)  05/28/23 215 lb 6.4 oz (97.7 kg)  05/21/23 212 lb (96.2 kg)     GEN: *** Well nourished, well developed in  no acute distress HEENT: Normal NECK: No JVD; No carotid bruits LYMPHATICS: No lymphadenopathy CARDIAC: ***RRR, no murmurs, rubs, gallops RESPIRATORY:  Clear to auscultation without  rales, wheezing or rhonchi  ABDOMEN: Soft, non-tender, non-distended MUSCULOSKELETAL:  No edema; No deformity  SKIN: Warm and dry NEUROLOGIC:  Alert and oriented x 3 PSYCHIATRIC:  Normal affect    Signed, Redell Leiter, MD  12/31/2023 8:04 PM    [facility]

## 2024-01-05 ENCOUNTER — Ambulatory Visit: Admitting: Cardiology

## 2024-01-05 DIAGNOSIS — I2510 Atherosclerotic heart disease of native coronary artery without angina pectoris: Secondary | ICD-10-CM

## 2024-01-05 DIAGNOSIS — I4510 Unspecified right bundle-branch block: Secondary | ICD-10-CM

## 2024-01-05 DIAGNOSIS — R931 Abnormal findings on diagnostic imaging of heart and coronary circulation: Secondary | ICD-10-CM

## 2024-01-05 DIAGNOSIS — E785 Hyperlipidemia, unspecified: Secondary | ICD-10-CM

## 2024-01-05 DIAGNOSIS — I3139 Other pericardial effusion (noninflammatory): Secondary | ICD-10-CM

## 2024-01-10 DIAGNOSIS — E039 Hypothyroidism, unspecified: Secondary | ICD-10-CM | POA: Diagnosis not present

## 2024-01-10 DIAGNOSIS — N1831 Chronic kidney disease, stage 3a: Secondary | ICD-10-CM | POA: Diagnosis not present

## 2024-01-20 DIAGNOSIS — N1831 Chronic kidney disease, stage 3a: Secondary | ICD-10-CM | POA: Diagnosis not present

## 2024-01-26 DIAGNOSIS — Z13820 Encounter for screening for osteoporosis: Secondary | ICD-10-CM | POA: Diagnosis not present

## 2024-01-26 DIAGNOSIS — E785 Hyperlipidemia, unspecified: Secondary | ICD-10-CM | POA: Diagnosis not present

## 2024-01-26 DIAGNOSIS — R7303 Prediabetes: Secondary | ICD-10-CM | POA: Diagnosis not present

## 2024-01-26 DIAGNOSIS — E039 Hypothyroidism, unspecified: Secondary | ICD-10-CM | POA: Diagnosis not present

## 2024-01-26 DIAGNOSIS — K219 Gastro-esophageal reflux disease without esophagitis: Secondary | ICD-10-CM | POA: Diagnosis not present

## 2024-01-26 DIAGNOSIS — N1831 Chronic kidney disease, stage 3a: Secondary | ICD-10-CM | POA: Diagnosis not present

## 2024-01-26 DIAGNOSIS — Z0000 Encounter for general adult medical examination without abnormal findings: Secondary | ICD-10-CM | POA: Diagnosis not present

## 2024-01-26 DIAGNOSIS — Z23 Encounter for immunization: Secondary | ICD-10-CM | POA: Diagnosis not present

## 2024-02-09 DIAGNOSIS — N1831 Chronic kidney disease, stage 3a: Secondary | ICD-10-CM | POA: Diagnosis not present

## 2024-02-09 DIAGNOSIS — E039 Hypothyroidism, unspecified: Secondary | ICD-10-CM | POA: Diagnosis not present

## 2024-02-29 DIAGNOSIS — K219 Gastro-esophageal reflux disease without esophagitis: Secondary | ICD-10-CM | POA: Diagnosis not present

## 2024-02-29 DIAGNOSIS — L02211 Cutaneous abscess of abdominal wall: Secondary | ICD-10-CM | POA: Diagnosis not present
